# Patient Record
Sex: Female | Born: 1945 | Race: White | Hispanic: No | Marital: Married | Smoking: Former smoker
Health system: Southern US, Community
[De-identification: ages and names within clinical notes are randomized; demographics above are authoritative.]

## PROBLEM LIST (undated history)

## (undated) DIAGNOSIS — E119 Type 2 diabetes mellitus without complications: Secondary | ICD-10-CM

## (undated) DIAGNOSIS — R0600 Dyspnea, unspecified: Secondary | ICD-10-CM

## (undated) DIAGNOSIS — E079 Disorder of thyroid, unspecified: Secondary | ICD-10-CM

## (undated) DIAGNOSIS — I219 Acute myocardial infarction, unspecified: Secondary | ICD-10-CM

## (undated) DIAGNOSIS — M1990 Unspecified osteoarthritis, unspecified site: Secondary | ICD-10-CM

## (undated) DIAGNOSIS — I10 Essential (primary) hypertension: Secondary | ICD-10-CM

## (undated) HISTORY — PX: CHOLECYSTECTOMY: SHX55

## (undated) HISTORY — PX: EYE SURGERY: SHX253

## (undated) HISTORY — PX: TUBAL LIGATION: SHX77

## (undated) HISTORY — PX: OTHER SURGICAL HISTORY: SHX169

## (undated) HISTORY — PX: CARDIAC CATHETERIZATION: SHX172

---

## 1997-10-08 ENCOUNTER — Ambulatory Visit (HOSPITAL_COMMUNITY): Admission: RE | Admit: 1997-10-08 | Discharge: 1997-10-08 | Payer: Self-pay | Admitting: Obstetrics and Gynecology

## 1997-10-12 ENCOUNTER — Ambulatory Visit (HOSPITAL_COMMUNITY): Admission: RE | Admit: 1997-10-12 | Discharge: 1997-10-12 | Payer: Self-pay | Admitting: Obstetrics and Gynecology

## 1997-12-04 ENCOUNTER — Other Ambulatory Visit: Admission: RE | Admit: 1997-12-04 | Discharge: 1997-12-04 | Payer: Self-pay | Admitting: Obstetrics and Gynecology

## 1998-04-18 ENCOUNTER — Ambulatory Visit (HOSPITAL_COMMUNITY): Admission: RE | Admit: 1998-04-18 | Discharge: 1998-04-18 | Payer: Self-pay | Admitting: Obstetrics and Gynecology

## 1998-04-18 ENCOUNTER — Encounter: Payer: Self-pay | Admitting: Obstetrics and Gynecology

## 1998-09-11 ENCOUNTER — Ambulatory Visit: Admission: RE | Admit: 1998-09-11 | Discharge: 1998-09-11 | Payer: Self-pay | Admitting: Cardiology

## 1999-01-03 ENCOUNTER — Other Ambulatory Visit: Admission: RE | Admit: 1999-01-03 | Discharge: 1999-01-03 | Payer: Self-pay | Admitting: Obstetrics and Gynecology

## 1999-05-01 ENCOUNTER — Emergency Department (HOSPITAL_COMMUNITY): Admission: EM | Admit: 1999-05-01 | Discharge: 1999-05-01 | Payer: Self-pay | Admitting: Emergency Medicine

## 1999-05-02 ENCOUNTER — Encounter: Payer: Self-pay | Admitting: Emergency Medicine

## 1999-05-06 ENCOUNTER — Emergency Department (HOSPITAL_COMMUNITY): Admission: EM | Admit: 1999-05-06 | Discharge: 1999-05-06 | Payer: Self-pay | Admitting: *Deleted

## 1999-05-07 ENCOUNTER — Encounter: Payer: Self-pay | Admitting: Emergency Medicine

## 1999-05-13 ENCOUNTER — Emergency Department (HOSPITAL_COMMUNITY): Admission: EM | Admit: 1999-05-13 | Discharge: 1999-05-13 | Payer: Self-pay | Admitting: Emergency Medicine

## 1999-05-21 ENCOUNTER — Encounter: Admission: RE | Admit: 1999-05-21 | Discharge: 1999-05-21 | Payer: Self-pay | Admitting: Internal Medicine

## 1999-06-03 ENCOUNTER — Encounter: Payer: Self-pay | Admitting: Cardiology

## 1999-06-03 ENCOUNTER — Encounter: Admission: RE | Admit: 1999-06-03 | Discharge: 1999-06-03 | Payer: Self-pay | Admitting: Cardiology

## 1999-06-27 ENCOUNTER — Ambulatory Visit (HOSPITAL_COMMUNITY): Admission: RE | Admit: 1999-06-27 | Discharge: 1999-06-28 | Payer: Self-pay | Admitting: Surgery

## 1999-06-27 ENCOUNTER — Encounter (INDEPENDENT_AMBULATORY_CARE_PROVIDER_SITE_OTHER): Payer: Self-pay | Admitting: Specialist

## 1999-12-17 ENCOUNTER — Ambulatory Visit (HOSPITAL_COMMUNITY): Admission: RE | Admit: 1999-12-17 | Discharge: 1999-12-17 | Payer: Self-pay | Admitting: Obstetrics and Gynecology

## 1999-12-17 ENCOUNTER — Encounter: Payer: Self-pay | Admitting: Obstetrics and Gynecology

## 2000-01-06 ENCOUNTER — Other Ambulatory Visit: Admission: RE | Admit: 2000-01-06 | Discharge: 2000-01-06 | Payer: Self-pay | Admitting: Obstetrics and Gynecology

## 2000-12-24 ENCOUNTER — Ambulatory Visit (HOSPITAL_COMMUNITY): Admission: RE | Admit: 2000-12-24 | Discharge: 2000-12-24 | Payer: Self-pay | Admitting: Obstetrics and Gynecology

## 2000-12-24 ENCOUNTER — Encounter: Payer: Self-pay | Admitting: Obstetrics and Gynecology

## 2001-01-07 ENCOUNTER — Encounter: Payer: Self-pay | Admitting: Emergency Medicine

## 2001-01-07 ENCOUNTER — Emergency Department (HOSPITAL_COMMUNITY): Admission: EM | Admit: 2001-01-07 | Discharge: 2001-01-08 | Payer: Self-pay | Admitting: Emergency Medicine

## 2001-01-20 ENCOUNTER — Other Ambulatory Visit: Admission: RE | Admit: 2001-01-20 | Discharge: 2001-01-20 | Payer: Self-pay | Admitting: Obstetrics and Gynecology

## 2002-04-07 ENCOUNTER — Ambulatory Visit (HOSPITAL_COMMUNITY): Admission: RE | Admit: 2002-04-07 | Discharge: 2002-04-07 | Payer: Self-pay | Admitting: Obstetrics and Gynecology

## 2002-04-07 ENCOUNTER — Encounter: Payer: Self-pay | Admitting: Obstetrics and Gynecology

## 2002-10-01 ENCOUNTER — Emergency Department (HOSPITAL_COMMUNITY): Admission: EM | Admit: 2002-10-01 | Discharge: 2002-10-01 | Payer: Self-pay | Admitting: Emergency Medicine

## 2003-08-26 ENCOUNTER — Emergency Department (HOSPITAL_COMMUNITY): Admission: EM | Admit: 2003-08-26 | Discharge: 2003-08-26 | Payer: Self-pay | Admitting: Emergency Medicine

## 2004-04-09 ENCOUNTER — Ambulatory Visit (HOSPITAL_COMMUNITY): Admission: RE | Admit: 2004-04-09 | Discharge: 2004-04-09 | Payer: Self-pay | Admitting: Obstetrics and Gynecology

## 2004-12-02 ENCOUNTER — Emergency Department (HOSPITAL_COMMUNITY): Admission: EM | Admit: 2004-12-02 | Discharge: 2004-12-02 | Payer: Self-pay | Admitting: Emergency Medicine

## 2007-06-07 ENCOUNTER — Inpatient Hospital Stay (HOSPITAL_COMMUNITY): Admission: EM | Admit: 2007-06-07 | Discharge: 2007-06-11 | Payer: Self-pay | Admitting: Emergency Medicine

## 2007-06-09 ENCOUNTER — Ambulatory Visit: Payer: Self-pay | Admitting: Vascular Surgery

## 2007-06-09 ENCOUNTER — Encounter (INDEPENDENT_AMBULATORY_CARE_PROVIDER_SITE_OTHER): Payer: Self-pay | Admitting: Cardiology

## 2007-07-19 ENCOUNTER — Encounter (INDEPENDENT_AMBULATORY_CARE_PROVIDER_SITE_OTHER): Payer: Self-pay | Admitting: Cardiology

## 2007-07-19 ENCOUNTER — Ambulatory Visit: Payer: Self-pay | Admitting: Vascular Surgery

## 2007-07-19 ENCOUNTER — Ambulatory Visit (HOSPITAL_COMMUNITY): Admission: RE | Admit: 2007-07-19 | Discharge: 2007-07-19 | Payer: Self-pay | Admitting: Cardiology

## 2008-02-29 ENCOUNTER — Ambulatory Visit (HOSPITAL_COMMUNITY): Admission: RE | Admit: 2008-02-29 | Discharge: 2008-02-29 | Payer: Self-pay | Admitting: Cardiology

## 2010-07-22 NOTE — Discharge Summary (Signed)
Marilyn Sexton, Marilyn Sexton            ACCOUNT NO.:  [id number]   MEDICAL RECORD NO.:  [id number]          PATIENT TYPE:  INP   LOCATION:  2035                         FACILITY:  [facility]   PHYSICIAN:  Marilyn Sexton. Marilyn Sexton, M.D. DATE OF BIRTH:  [birth year]   DATE OF ADMISSION:  06/07/2007  DATE OF DISCHARGE:  06/11/2007                               DISCHARGE SUMMARY   ADMITTING DIAGNOSES:  1. Acute non-Q-wave myocardial infarction.  2. Hypertension.  3. Non-insulin-dependent diabetes mellitus.  4. Morbid obesity.  5. Hypothyroidism.  6. Hypercholesteremia.  7. Depression.   FINAL DIAGNOSES:  1. Status post acute non-Q-wave myocardial infarction.  2. Status post percutaneous transluminal coronary angioplasty stenting      to right coronary artery.  3. Very small right femoral arteriovenous fistula status post left      catheterization and percutaneous transluminal coronary angioplasty.  4. Hypertension.  5. Non-insulin-dependent diabetes mellitus.  6. Hypothyroidism.  7. Hypercholesteremia.  8. Morbid obesity.   DISCHARGE HOME MEDICATIONS:  1. Enteric-coated aspirin 81 mg 1 tablet daily.  2. Plavix 75 mg 1 tablet daily with food.  3. Lopressor 25 mg 1 tablet twice daily.  4. Lipitor 40 mg 1 tablet daily.  5. Benazepril 10 mg 1 tablet daily.  6. Metformin 500 mg 1 tablet twice daily with meals.  7. Lexapro 20 mg 1 tablet daily.  8. Risperdal 3 mg 1 tablet daily at night as before.  9. Synthroid 100 mcg 1 tablet daily as before.  10.Nitrostat 0.4 mg sublingual 1, use as directed.   The patient has been advised to monitor BP and blood sugar daily.  Post  PCI instructions have been given.   DIET:  Low-salt, low-cholesterol 1800 calories ADA diet.   ACTIVITY:  Increase activity slowly.  The patient will be scheduled for  phase II cardiac rehab as outpatient.  The patient has been advised to  call 911 if she notices any swelling in the right groin or any  discharge.  We will repeat  the duplex ultrasound of right femoral artery  if she had persistent bruit in few weeks, and if positive for AV  fistula, then refer her to vascular surgery for repair of the AV  fistula.   CONDITION AT DISCHARGE:  Stable.   BRIEF HISTORY:  Marilyn Sexton is a 65 year old white female with past  medical history significant for hypertension, non-insulin-dependent  diabetes mellitus, hypercholesteremia, morbid obesity, depression,  positive family history of coronary artery disease, and history of  tobacco abuse.  She came to the ER complaining of recurrent retrosternal  chest pain since last Friday, grade 5/10, radiating to the throat,  lasting few minutes on Friday and Sunday.  She took one sublingual  nitroglycerin on Sunday night from her husband meds with relief of chest  pain today.  She had chest pain lasting more than one hour, so decided  to come to the ER.  Denies any nausea, vomiting, or diaphoresis.  Denies  shortness of breath.  Denies PND, orthopnea, or leg swelling.  Denies  such episodes of chest pain in the past.  Denies any cardiac workup  in  the past.   PAST MEDICAL HISTORY:  As above.   PAST SURGICAL HISTORY:  She had cholecystectomy in the past and had  tubal ligation in the past.   SOCIAL HISTORY:  She is married, has one child.  Smoked one and a half  pack per day for 30 plus years, quit 17 years ago.  No history of  alcohol abuse.  She worked for [employer] in the past.   FAMILY HISTORY:  Father died of stroke at the age of 102.  Mother died of  ruptured aneurysm of brain.  One sister had coronary artery disease and  subsequently had MI.   ALLERGIES:  No known drug allergies.   MEDICATION AT HOME:  1. She was on Avandia 8 mg p.o. daily.  2. Risperdal 3 mg daily.  3. Synthroid 100 mcg daily.  4. Benazepril 20 mg daily.  5. Lexapro 20 mg daily.   PHYSICAL EXAMINATION:  GENERAL:  She was alert, awake, and oriented x3  in no acute distress.  VITAL  SIGNS:  Blood pressure was 163/73 and pulse of 93.  HEENT:  Conjunctivae was pink.  NECK:  Supple.  No JVD.  No bruit.  LUNGS:  Clear to auscultation without rhonchi or rales.  CARDIOVASCULAR:  S1 and S2 was normal.  There was soft systolic murmur  and S4 gallop.  ABDOMEN:  Soft.  Bowel sounds were present, obese, and nontender.  EXTREMITIES:  There is no clubbing, cyanosis, or edema.   LABS:  Chest x-ray showed no acute cardiopulmonary process.  Her  hemoglobin was 12.2 and hematocrit 36.  Point-of-care troponin I was  0.44 which was elevated and CPK-MB was slightly elevated at 8.5.  Repeat  hemoglobin was 11.9, hematocrit 34.7, white count of 7.0, and platelet  count was 233,000.  CPKs by lab 227, MB 12.7, relative index 5.6, and  troponin I was 2.92.  Repeat cardiac panel on June 09, 2007; CK 140, MB  2.7, relative index 1.9, and troponin I was 1.07.  On June 10, 2007; CK  of 186, MB 2.0, related index 1.1, and troponin I of 0.75.  Sodium was  137, potassium 4.4, chloride 99, blood glucose 122, BUN 10, and  creatinine 0.91.  Her TSH was 1.274 and hemoglobin A1c was 6.8.  Her  cholesterol was 231, LDL 154, HDL 38, and triglycerides 132.   BRIEF HOSPITAL COURSE:  Discussed with the patient and her husband  regarding emergency left cath and possible PTCA stenting, its risks and  benefits, i.e., death, mild stroke, need for emergency CABG, risk of  restenosis, and local vascular complications.  Accepted and consented  for PCI.  The patient subsequently underwent left cardiac cath with  selective left and right coronary angiography and PTCA stenting to  proximal and proximal and mid-junction of RCA as per procedure report.  The patient tolerated procedure well.  There were no complications.  The  patient did not have any episodes of chest pain during the hospital  stay.  The patient's phase I cardiac rehab was called.  The patient had  moderate to large sized ecchymosis and very faint  bruit with no evidence  of hematoma.  The patient had duplex ultrasound of the right femoral  artery which showed very small AV fistula but no evidence of  pseudoaneurysm.  The patient's, otherwise, groin remained stable.  The  patient has been ambulating in hallway without any problems.  Blood  sugar is fairly well controlled.  The patient did not have any episodes  of chest pain during the hospital stay or any cardiac arrhythmias.  The  patient will be discharged home on above medications and will be  followed up in my office in 1 week.  We will repeat the duplex  ultrasound in few weeks if she had persistent femoral bruits and refer  to Vascular Surgery for repair of the AV fistula.      Marilyn Sexton. Marilyn Sexton, M.D.  Electronically Signed     MNH/MEDQ  D:  06/11/2007  T:  06/11/2007  Job:  [id number]

## 2010-07-22 NOTE — Cardiovascular Report (Signed)
NAMEVERNESHA, Marilyn Sexton            ACCOUNT NO.:  [id number]   MEDICAL RECORD NO.:  [id number]          Marilyn Sexton TYPE:  INP   LOCATION:  2907                         FACILITY:  [facility]   PHYSICIAN:  Eduardo Osier. Sharyn Lull, M.D. DATE OF BIRTH:  19-Dec-1945   DATE OF PROCEDURE:  06/07/2007  DATE OF DISCHARGE:                            CARDIAC CATHETERIZATION   PROCEDURES:  1. Left cardiac cath with selective left and right coronary      angiography, left ventriculogram via right groin using Judkins      technique.  2. Successful percutaneous transluminal coronary angioplasty to      proximal and mid junction of right coronary artery using 2.5 x 12      mm long Voyager balloon.  3. Successful deployment of 2.75 x 28 mm long Promus drug-eluting      stent in proximal and mid right coronary artery.  4. Successful post dilatation of this stent using 3 x 12 mm long [place]      Voyager balloon.   INDICATIONS FOR PROCEDURE:  Marilyn Sexton is a 65 year old white female  with past medical history significant for hypertension, non-insulin-  dependent diabetes mellitus, hypercholesteremia, morbid obesity,  depression, positive family history of coronary artery disease, history  of tobacco abuse.  Marilyn Sexton came to [facility] ER complaining of retrosternal chest  pain since last Friday, grade 5/10, radiating to Marilyn throat, lasting few  minutes on Friday and then again on Sunday night, had similar pain.  Marilyn Sexton  took one sublingual nitroglycerin with relief of chest pain.  Marilyn  Marilyn Sexton did not seek medical attention until today.  This a.m. Marilyn Sexton  developed similar chest pain lasting more than one hour so Marilyn Sexton decided  to come to [facility] ER.  Denies any nausea, vomiting, diaphoresis.  Denies  shortness of breath.  Denies PND, orthopnea, leg swelling.  Denies such  episodes of chest pain in Marilyn past.  Denies any cardiac workup in Marilyn  past.   PAST MEDICAL HISTORY:  As above.   PAST SURGICAL HISTORY:  Marilyn Sexton had cholecystectomy and  tubal ligation in  Marilyn past.   SOCIAL HISTORY:  Marilyn Sexton is married, has one child.  Smoked one and a half  packs per day for 30+ years, quit 17 years ago.  No history of alcohol  abuse.  Worked for [employer] in Marilyn past.   FAMILY HISTORY:  Father died of stroke at Marilyn age of 41.  Mother had  ruptured brain aneurysm.  One sister had coronary artery disease and MI.   ALLERGIES:  No known drug allergies.   HOME MEDICATIONS:  Marilyn Sexton is on  1. Avandia 8 mg p.o. daily.  2. Risperdal 3 mg p.o. daily.  3. Synthroid 100 mcg daily.  4. Benazepril 20 mg daily.  5. Lexapro 20 mg daily.   PHYSICAL EXAMINATION:  GENERAL APPEARANCE:  Marilyn Sexton is alert, awake and  oriented x3, in no acute distress.  VITAL SIGNS:  Blood pressure was 163/73, pulse was 93.  HEENT:  Conjunctivae was pink.  NECK:  Supple.  No JVD, no bruit.  LUNGS:  Clear to auscultation  without rhonchi or rales.  CARDIOVASCULAR:  S1 and S2 normal.  There was soft systolic murmur and  S4 gallop.  ABDOMEN:  Soft.  Bowel sounds were present, obese, nontender.  EXTREMITIES:  There is no clubbing, cyanosis or edema.   EKG showed normal sinus rhythm with RSR pattern.  There was minimal ST  elevation in lead III but there are no reciprocal changes.  There were  nonspecific T-wave changes.   Her troponin I was 0.44 and 0.39.  CPK-MB was slightly elevated at 8.5.   Discussed with Marilyn Sexton regarding emergency left cath, possible PTCA  stenting, its risks and benefits, i.e., death, stroke, need for  emergency CABG, risk of restenosis, local vascular complications, etc.  and consented for Marilyn PCI.   PROCEDURE:  After obtaining Marilyn informed consent, Marilyn Sexton was  brought to Marilyn cath lab and was placed on fluoroscopy table.  Right  groin was prepped and draped in usual fashion.  I used 2% Xylocaine for  local anesthesia in Marilyn right groin.  With Marilyn help of thin-wall needle,  6-French arterial sheath was placed.  Marilyn sheath was  aspirated and  flushed.  Next a 6-French left Judkins catheter was advanced over Marilyn  wire under fluoroscopic guidance up to Marilyn ascending aorta.  Wire was  pulled out.  Marilyn catheter was aspirated and connected to Marilyn manifold.  Catheter was further advanced and engaged into left coronary ostium.  Multiple views of Marilyn left system were taken.  Next Marilyn catheter was  disengaged and was pulled out over Marilyn wire and was replaced with 6-  [place] right Judkins catheter which was advanced over Marilyn wire under  fluoroscopic guidance up to Marilyn ascending aorta.  Wire was pulled out,  Marilyn catheter was aspirated and connected to Marilyn manifold.  Catheter was  further advanced and engaged into right coronary ostium.  Multiple views  of Marilyn right system were taken.  Next Marilyn catheter was disengaged and  was pulled out over Marilyn wire and was replaced with pigtail catheter at  Marilyn end of Marilyn procedure, which was advanced over Marilyn wire under  fluoroscopic guidance up to Marilyn ascending aorta.  Catheter was further  advanced across Marilyn aortic valve into Marilyn LV.  LV pressures were  recorded.  Next LV graft was done in 30 degree RAO position.  Post  angiographic pressures were recorded from LV and then pullback pressures  were recorded from Marilyn aorta.  There was no gradient across Marilyn aortic  valve.  Next a pigtail catheter was pulled out over Marilyn wire.  Sheaths  aspirated and flushed.   FINDINGS:  LV showed inferior wall hypokinesia, EF of 50% approximately,  left main was patent.  LAD has 20-25% proximal stenosis.  Diagonal-1 has  20-30% proximal stenosis.  Left circumflex is patent proximally and then  tapers down in AV groove for after giving off large OM-3.  OM-1 is very,  very small which is diffusely disease.  OM-2 is very small which is  patent.  OM-3 is large, which is patent.  RCA has 40-50% ostial stenosis  and 90 and 99% sequential proximal and proximal and mid junction  stenosis with haziness.  PDA  and PLV branches were small which are  patent.   INTERVENTIONAL PROCEDURE:  Successful PTCA to proximal and proximal and  mid junction of RCA was done using 2.5 x 12 mm long Voyager balloon for  predilatation and then 2.75 x 28 mm long  Promus drug-eluting stent was  deployed in proximal and mid RCA at 13 atmospheres pressure.  Stent was  postdilated using 3 x 12 mm long [place] Voyager balloon going up to 15-18  atmospheres pressure.  Lesion was dilated from 95 and 99% to 0% residual  with excellent TIMI grade 3 distal flow without evidence of dissection  or distal embolization.  Marilyn Sexton  received weight-based heparin, Integrilin, 600 mg of Plavix and 5 mg of  IV Lopressor during Marilyn procedure.  Marilyn Sexton tolerated Marilyn procedure  well.  There were no complications.  Marilyn Sexton was transferred to Marilyn  recovery room in stable condition.      Eduardo Osier. Sharyn Lull, M.D.  Electronically Signed     MNH/MEDQ  D:  06/07/2007  T:  06/08/2007  Job:  [id number]   cc:   Cath Lab

## 2010-07-25 NOTE — Op Note (Signed)
. [facility]  Patient:    Marilyn Sexton, Marilyn Sexton                   MRN: [id number] Proc. Date: 06/27/99 Adm. Date:  [id number] Attending:  Abigail Miyamoto A                           Operative Report  PREOPERATIVE DIAGNOSIS:  Symptomatic cholelithiasis.  POSTOPERATIVE DIAGNOSIS:  Symptomatic cholelithiasis.  PROCEDURE:  Laparoscopic cholecystectomy.  SURGEON:  Abigail Miyamoto, M.D.  ANESTHESIA:  General endotracheal.  ESTIMATED BLOOD LOSS:  Minimal.  DESCRIPTION OF PROCEDURE:  The patient was brought to the operating room and identified as [name].  She was placed supine on the operating room table and general anesthesia was induced.  Her abdomen was then prepped and draped in the usual sterile fashion.  Using a ______ blade, a small transverse incision was made just below the umbilicus.  The incision was carried down bluntly through the abdominal wall fascia.  The fascia was then opened with a scalpel.  A hemostat as then used to pass into the peritoneal cavity.  Next, a 0 Vicryl pursestring suture was placed around the fascial opening.  The Hasson port was then placed through the opening and insufflation of the abdomen was begun.  Next, an 11 mm port was placed in the patients epigastrium and two 5 mm ports were placed in the patients right flank under direct vision.  The gallbladder was then grasped and retracted above the liver bed.  Dissection was then carried out.  The cystic duct and artery were each dissected out.  Both were then clipped three times proximally and once distally and transected with the scissors.  The gallbladder was then slowly dissected free from the liver bed with the electrocautery.  Hemostasis appeared to be achieved from the electrocautery.  The gallbladder was then completely removed and placed in an Endosac and removed through the umbilicus.  The 0 Vicryl at the umbilicus was then fastened in  place.  The abdomen was then thoroughly irrigated. Hemostasis again appeared to be achieved.  All ports were then removed under direct vision and the abdomen was deflated.  All skin incisions were then anesthetized  with 0.25% Marcaine.  The skin incisions were then closed with 4-0 Vicryl sutures. Steri-Strips, gauze and Tegaderm were then applied.  The patient tolerated the procedure well.  All sponge, needle and instrument counts were correct at the end of the procedure.  The patient was then extubated in the operating room and taken in stable condition to the recovery room. DD:  06/27/99 TD:  06/27/99 Job: [id number]

## 2010-12-01 LAB — CBC
Hemoglobin: 11.4 — ABNORMAL LOW
MCV: 95.3
MCV: 96.3
Platelets: 207
RDW: 16.9 — ABNORMAL HIGH
WBC: 7
WBC: 7

## 2010-12-01 LAB — POCT I-STAT, CHEM 8
BUN: 10
Creatinine, Ser: 0.9
Glucose, Bld: 83
TCO2: 29

## 2010-12-01 LAB — COMPREHENSIVE METABOLIC PANEL
Albumin: 3.5
BUN: 7
CO2: 27
Chloride: 102
Creatinine, Ser: 0.65
GFR calc non Af Amer: >60
Potassium: 4
Total Bilirubin: 0.8

## 2010-12-01 LAB — CARDIAC PANEL(CRET KIN+CKTOT+MB+TROPI): CK, MB: 12.7 — ABNORMAL HIGH

## 2010-12-01 LAB — TSH: TSH: 1.274

## 2010-12-01 LAB — HEMOGLOBIN A1C: Hgb A1c MFr Bld: 6.8 — ABNORMAL HIGH

## 2010-12-01 LAB — POCT CARDIAC MARKERS
CKMB, poc: 8.5
Myoglobin, poc: 77.4
Troponin i, poc: 0.39 — ABNORMAL HIGH

## 2010-12-01 LAB — PROTIME-INR: Prothrombin Time: 13.8

## 2010-12-02 LAB — CARDIAC PANEL(CRET KIN+CKTOT+MB+TROPI)
CK, MB: 2
CK, MB: 8 — ABNORMAL HIGH
Relative Index: 1.1
Relative Index: 1.9
Relative Index: 3.8 — ABNORMAL HIGH
Total CK: 212 — ABNORMAL HIGH
Total CK: 230 — ABNORMAL HIGH
Troponin I: 3.08

## 2010-12-02 LAB — CBC
HCT: 26.7 — ABNORMAL LOW
HCT: 30.5 — ABNORMAL LOW
Hemoglobin: 10.2 — ABNORMAL LOW
Hemoglobin: 10.4 — ABNORMAL LOW
MCV: 100.7 — ABNORMAL HIGH
MCV: 95.3
RBC: 3.1 — ABNORMAL LOW
RBC: 3.18 — ABNORMAL LOW
RDW: 14.6
WBC: 6.7
WBC: 6.8
WBC: 7

## 2010-12-02 LAB — BASIC METABOLIC PANEL
BUN: 7
CO2: 26
Calcium: 8.5
Creatinine, Ser: 0.77
GFR calc non Af Amer: >60
Sodium: 133 — ABNORMAL LOW

## 2010-12-02 LAB — COMPREHENSIVE METABOLIC PANEL
ALT: 34
AST: 36
Alkaline Phosphatase: 66
CO2: 29
GFR calc non Af Amer: >60
Glucose, Bld: 122 — ABNORMAL HIGH
Potassium: 4.4
Sodium: 137
Total Protein: 5.7 — ABNORMAL LOW

## 2010-12-02 LAB — LIPID PANEL
Cholesterol: 231 — ABNORMAL HIGH
HDL: 38 — ABNORMAL LOW
Triglycerides: 194 — ABNORMAL HIGH

## 2010-12-02 LAB — BASIC METABOLIC PANEL WITH GFR
BUN: 10
CO2: 29
Calcium: 8.9
Chloride: 100
Creatinine, Ser: 0.81
GFR calc non Af Amer: >60
Glucose, Bld: 157 — ABNORMAL HIGH
Potassium: 3.9
Sodium: 134 — ABNORMAL LOW

## 2010-12-02 LAB — DIFFERENTIAL
Basophils Relative: 0
Eosinophils Absolute: 0.1
Eosinophils Relative: 1
Monocytes Relative: 11
Neutrophils Relative %: 56

## 2011-09-09 ENCOUNTER — Other Ambulatory Visit (HOSPITAL_COMMUNITY): Payer: Self-pay | Admitting: Cardiology

## 2011-09-09 DIAGNOSIS — R079 Chest pain, unspecified: Secondary | ICD-10-CM

## 2011-09-21 ENCOUNTER — Encounter (HOSPITAL_COMMUNITY)
Admission: RE | Admit: 2011-09-21 | Discharge: 2011-09-21 | Disposition: A | Discharge status: Home / Self Care | Payer: Self-pay | Source: Ambulatory Visit | Attending: Cardiology | Admitting: Cardiology

## 2011-09-21 ENCOUNTER — Ambulatory Visit (HOSPITAL_COMMUNITY)
Admission: RE | Admit: 2011-09-21 | Discharge: 2011-09-21 | Disposition: A | Discharge status: Home / Self Care | Payer: Self-pay | Source: Ambulatory Visit | Attending: Cardiology | Admitting: Cardiology

## 2011-09-21 DIAGNOSIS — R0602 Shortness of breath: Secondary | ICD-10-CM | POA: Insufficient documentation

## 2011-09-21 DIAGNOSIS — R079 Chest pain, unspecified: Secondary | ICD-10-CM

## 2011-09-21 MED ORDER — TECHNETIUM TC 99M TETROFOSMIN IV KIT
10.0000 | PACK | Freq: Once | INTRAVENOUS | Status: AC | PRN
  Administered 2011-09-21: 10 via INTRAVENOUS

## 2011-09-21 MED ORDER — REGADENOSON 0.4 MG/5ML IV SOLN
INTRAVENOUS | Status: AC
  Administered 2011-09-21: 0.4 mg via INTRAVENOUS
  Filled 2011-09-21: qty 5

## 2011-09-21 MED ORDER — REGADENOSON 0.4 MG/5ML IV SOLN
0.4000 mg | Freq: Once | INTRAVENOUS | Status: AC
  Administered 2011-09-21: 0.4 mg via INTRAVENOUS

## 2011-09-21 MED ORDER — TECHNETIUM TC 99M TETROFOSMIN IV KIT
30.0000 | PACK | Freq: Once | INTRAVENOUS | Status: AC | PRN
  Administered 2011-09-21: 30 via INTRAVENOUS

## 2013-11-03 ENCOUNTER — Encounter (HOSPITAL_COMMUNITY): Payer: Self-pay | Admitting: *Deleted

## 2013-11-03 ENCOUNTER — Ambulatory Visit (HOSPITAL_COMMUNITY)
Admission: RE | Admit: 2013-11-03 | Discharge: 2013-11-03 | Disposition: A | Discharge status: Home / Self Care | Payer: Self-pay | Attending: Psychiatry | Admitting: Psychiatry

## 2013-11-03 NOTE — BH Assessment (Signed)
Assessment Note  Marilyn Sexton is an 68 y.o. female. Pt presents to [facility] accompanied by [employer]. Patient reports that she is presenting because her husband and her son think she is crazy. Patient reports that she was willing to participate in an assessment only if law enforcement brought her to [facility]. 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 Patient reports that she has not heard any voices since. Pt proclaims that she has been healed from the voices by God. Pt presents hyper religious. Patient reports that she is a [place] and her husband and son are not and don't believe in the same religious beliefs that she does. Patient reports that her husband might think she is crazy because she sits on the porch at night waiting for Jesus to return. Patient proclaims that Jesus is coming soon. Patient insist that she is not crazy and nothing is wrong with her and she does not need to be on any medication or see a psychiatrist. Pt denies SI,HI, and no AVH reported. Patient's husband provides collateral and reports that he is concerned about patient's hyper religious behavior but does not feel that patient is a threat to anyone or herself.  Consulted with AC Thurman Coyer and Maryjean Morn who is recommending that patient follow-up with current provider Tamela Oddi at [facility] and PCP for routine follow-up exam. Pt agreeable to follow up with psychiatrist as needed and reports that she had a routine appointment with PCP 2 months ago. Crisis resources provided.  MSE Declined.  Axis I: Mood Disorder NOS Axis II: Deferred Axis III: No past medical history on file. Axis IV: problems related to social environment and problems with primary support group Axis V: 61-70 mild symptoms  Past Medical History:  No past medical history on file.  No past surgical history on file.  Family History: No family history on file.  Social History:  reports that she does not drink alcohol or use illicit drugs. Her tobacco history is not on file.  Additional Social History:  Alcohol / Drug Use History of alcohol / drug use?: No history of alcohol / drug abuse  CIWA:   COWS:    Allergies: No Known Allergies  Home Medications:  (Not in a hospital admission)  OB/GYN Status:  No LMP recorded. Patient is postmenopausal.  General Assessment Data Location of Assessment: [facility] Is this a Tele or Face-to-Face Assessment?: Face-to-Face Is this an Initial Assessment or a Re-assessment for this encounter?: Initial Assessment Living Arrangements: Spouse/significant other Can pt return to current living arrangement?: Yes Admission Status: Voluntary Is patient capable of signing voluntary admission?: Yes Transfer from: Home Referral Source: Self/Family/Friend     [facility] Crisis Care Plan Living Arrangements: Spouse/significant other Name of Psychiatrist: None Current Provider Name of Therapist: None Current Provider     Risk to self with the past 6 months Suicidal Ideation: No Suicidal Intent: No Is patient at risk for suicide?: No Suicidal Plan?: No Access to Means: No What has been your use of drugs/alcohol within the last 12 months?: None reported Previous Attempts/Gestures: No How many times?: 0 Other Self Harm Risks: 0 Triggers for Past Attempts: None known Intentional Self Injurious Behavior: None Family Suicide History: No Recent stressful life event(s): Conflict (Comment) (conflict with husband) Persecutory  voices/beliefs?: No Depression: No Substance abuse history and/or treatment for substance abuse?: No Suicide prevention information given to non-admitted patients: Yes  Risk to Others within the past 6 months Homicidal Ideation: No Thoughts of Harm to Others:  No Current Homicidal Intent: No Current Homicidal Plan: No Access to Homicidal Means: No Identified Victim: na History of harm to others?: No Assessment of Violence: None Noted Violent Behavior Description: None Noted Does patient have access to weapons?: No Criminal Charges Pending?: No Does patient have a court date: No  Psychosis Hallucinations: None noted Delusions: None noted  Mental Status Report Appear/Hygiene: Other (Comment) (appropriate) Eye Contact: Fair Motor Activity: Freedom of movement Speech: Logical/coherent Level of Consciousness: Alert Mood: Anxious Affect: Anxious;Appropriate to circumstance Anxiety Level: Minimal Thought Processes: Coherent;Relevant Judgement: Unimpaired Orientation: Person;Place;Time;Situation Obsessive Compulsive Thoughts/Behaviors: None  Cognitive Functioning Concentration: Normal Memory: Recent Intact;Remote Intact IQ: Average Insight: Fair Impulse Control: Fair Appetite: Good Weight Loss: 0 Weight Gain: 0 Sleep: No Change Total Hours of Sleep: 7 Vegetative Symptoms: None  ADLScreening [facility] Assessment Services) Patient's cognitive ability adequate to safely complete daily activities?: Yes Patient able to express need for assistance with ADLs?: Yes Independently performs ADLs?: Yes (appropriate for developmental age)  Prior Inpatient Therapy Prior Inpatient Therapy: Yes Prior Therapy Dates: 1980's Prior Therapy Facilty/Provider(s): [facility] in the [phone] Reason for Treatment: Psychosis-AH, and  "chemical imbalance"  Prior Outpatient Therapy Prior Outpatient Therapy: Yes Prior Therapy Dates: Current Provider Prior Therapy Facilty/Provider(s): [facility] Tressie Ellis NP Reason for Treatment: Psychiatry  ADL Screening (condition at time of admission) Patient's cognitive ability adequate to safely complete daily activities?: Yes Is the patient deaf or have difficulty hearing?: No Does  the patient have difficulty seeing, even when wearing glasses/contacts?: No Does the patient have difficulty concentrating, remembering, or making decisions?: No Patient able to express need for assistance with ADLs?: Yes Does the patient have difficulty dressing or bathing?: No Independently performs ADLs?: Yes (appropriate for developmental age) Does the patient have difficulty walking or climbing stairs?: No Weakness of Legs: None  Home Assistive Devices/Equipment Home Assistive Devices/Equipment: None    Abuse/Neglect Assessment (Assessment to be complete while patient is alone) Physical Abuse: Denies Verbal Abuse: Denies Sexual Abuse: Denies Exploitation of patient/patient's resources: Denies Self-Neglect: Denies     Merchant navy officer (For Healthcare) Does patient have an advance directive?: No Would patient like information on creating an advanced directive?: No - patient declined information    Additional Information 1:1 In Past 12 Months?: No CIRT Risk: No Elopement Risk: No Does patient have medical clearance?: No     Disposition:  Disposition Initial Assessment Completed for this Encounter: Yes Disposition of Patient: Other dispositions Other disposition(s): To current provider  On Site Evaluation by:   Reviewed with Physician:    Gerline Legacy, MS, LCASA Assessment Counselor  11/03/2013 2:35 PM

## 2014-07-19 ENCOUNTER — Encounter (HOSPITAL_COMMUNITY): Payer: Self-pay | Admitting: *Deleted

## 2014-07-19 ENCOUNTER — Emergency Department (HOSPITAL_COMMUNITY)
Admission: EM | Admit: 2014-07-19 | Discharge: 2014-07-23 | Disposition: A | Discharge status: Home / Self Care | Payer: Medicare Other | Attending: Emergency Medicine | Admitting: Emergency Medicine

## 2014-07-19 DIAGNOSIS — F3010 Manic episode without psychotic symptoms, unspecified: Secondary | ICD-10-CM

## 2014-07-19 DIAGNOSIS — F333 Major depressive disorder, recurrent, severe with psychotic symptoms: Secondary | ICD-10-CM | POA: Insufficient documentation

## 2014-07-19 DIAGNOSIS — R000 Tachycardia, unspecified: Secondary | ICD-10-CM | POA: Insufficient documentation

## 2014-07-19 DIAGNOSIS — Z9119 Patient's noncompliance with other medical treatment and regimen: Secondary | ICD-10-CM | POA: Insufficient documentation

## 2014-07-19 DIAGNOSIS — F329 Major depressive disorder, single episode, unspecified: Secondary | ICD-10-CM | POA: Diagnosis present

## 2014-07-19 DIAGNOSIS — E119 Type 2 diabetes mellitus without complications: Secondary | ICD-10-CM | POA: Insufficient documentation

## 2014-07-19 DIAGNOSIS — F3112 Bipolar disorder, current episode manic without psychotic features, moderate: Secondary | ICD-10-CM | POA: Diagnosis present

## 2014-07-19 DIAGNOSIS — E079 Disorder of thyroid, unspecified: Secondary | ICD-10-CM | POA: Insufficient documentation

## 2014-07-19 DIAGNOSIS — Z7982 Long term (current) use of aspirin: Secondary | ICD-10-CM | POA: Insufficient documentation

## 2014-07-19 DIAGNOSIS — Z9114 Patient's other noncompliance with medication regimen: Secondary | ICD-10-CM | POA: Diagnosis not present

## 2014-07-19 DIAGNOSIS — I10 Essential (primary) hypertension: Secondary | ICD-10-CM | POA: Insufficient documentation

## 2014-07-19 DIAGNOSIS — I679 Cerebrovascular disease, unspecified: Secondary | ICD-10-CM | POA: Diagnosis not present

## 2014-07-19 DIAGNOSIS — Z79899 Other long term (current) drug therapy: Secondary | ICD-10-CM | POA: Diagnosis not present

## 2014-07-19 DIAGNOSIS — Z01818 Encounter for other preprocedural examination: Secondary | ICD-10-CM

## 2014-07-19 HISTORY — DX: Type 2 diabetes mellitus without complications: E11.9

## 2014-07-19 HISTORY — DX: Essential (primary) hypertension: I10

## 2014-07-19 HISTORY — DX: Disorder of thyroid, unspecified: E07.9

## 2014-07-19 NOTE — ED Provider Notes (Addendum)
CSN: [id number]     Arrival date & time 07/19/14  [id number] History   This chart was scribed for Romelle Muldoon, MD by Modena JanskyAlbert Thayil, ED Scribe. This patient was seen in room WTR2/WLPT2 and the patient's care was started at 11:53 PM.     Medical screening examination/treatment/procedure(s) were conducted as a shared visit with non-physician practitioner(s) and myself.  I personally evaluated the patient during the encounter.   EKG Interpretation None      Chief Complaint  Patient presents with  . Depression   Patient is a [age] y.o. female presenting with general illness. The history is provided by the patient. No language interpreter was used.  Illness Severity:  Moderate Onset quality:  Gradual Timing:  Constant Progression:  Worsening Chronicity:  New Context:  Not taking psychiatric and BP medication Relieved by:  None Worsened by:  None Ineffective treatments:  None  HPI Comments: Marilyn Sexton is a 69 y.o. female who presents to the Emergency Department complaining of depression. She states that she has a hx of manic schizophrenia and has not been taking her medication for about a year. She reports that "the Shaune PollackLord said that you don't have a chemical imbalance". She states that she has not been taking her BP medication. She denies any SI, HI, or hallucinations.   Past Medical History  Diagnosis Date  . Thyroid disease   . Hypertension   . Diabetes mellitus without complication    History reviewed. No pertinent past surgical history. No family history on file. History  Substance Use Topics  . Smoking status: Never Smoker   . Smokeless tobacco: Not on file  . Alcohol Use: No   OB History    No data available     Review of Systems  Psychiatric/Behavioral: Positive for behavioral problems. Negative for suicidal ideas and hallucinations.  All other systems reviewed and are negative.   Allergies  Review of patient's allergies indicates no known allergies.  Home  Medications   Prior to Admission medications   Medication Sig Start Date End Date Taking? Authorizing Provider  aspirin EC 81 MG tablet Take 81 mg by mouth every evening.   Yes Historical Provider, MD  levothyroxine (SYNTHROID, LEVOTHROID) 100 MCG tablet Take 100 mcg by mouth daily before breakfast.   Yes Historical Provider, MD  metFORMIN (GLUCOPHAGE) 1000 MG tablet Take 1,000 mg by mouth 2 (two) times daily.   Yes Historical Provider, MD  PRESCRIPTION MEDICATION Take 1 tablet by mouth daily. 2nd Medication for blood sugar unknown to patient once daily   Yes Historical Provider, MD   BP 234/99 mmHg  Pulse 115  Temp(Src) 97.5 F (36.4 C) (Oral)  Resp 22  SpO2 98% Physical Exam  Constitutional: She is oriented to person, place, and time. She appears well-developed and well-nourished. No distress.  HENT:  Head: Normocephalic and atraumatic.  Mouth/Throat: Oropharynx is clear and moist. No oropharyngeal exudate.  Eyes: Conjunctivae and EOM are normal.  Neck: Normal range of motion. Neck supple. No tracheal deviation present.  Cardiovascular: Normal rate, regular rhythm and normal heart sounds.  Exam reveals no gallop and no friction rub.   No murmur heard. Pulmonary/Chest: Effort normal and breath sounds normal. No respiratory distress. She has no wheezes. She has no rales.  Abdominal: Soft. Bowel sounds are normal. There is no tenderness. There is no rebound and no guarding.  Musculoskeletal: Normal range of motion.  Neurological: She is alert and oriented to person, place, and time.  Skin: Skin  is warm and dry.  Psychiatric: Her speech is rapid and/or pressured. She is not actively hallucinating. Thought content is delusional.  Nursing note and vitals reviewed.   ED Course  Procedures (including critical care time) DIAGNOSTIC STUDIES: Oxygen Saturation is 98% on RA, normal by my interpretation.    COORDINATION OF CARE: 11:57 PM- Pt advised of plan for treatment which includes  labs and pt agrees.  Labs Review Labs Reviewed  URINALYSIS, ROUTINE W REFLEX MICROSCOPIC  ETHANOL  URINE RAPID DRUG SCREEN (HOSP PERFORMED)    Imaging Review No results found.   EKG Interpretation None      MDM   Final diagnoses:  None   Hyper religious not taking meds because of same.     To be seen in am by psychiatry attending in am  Per Dr. Sharyn LullHarwani, if she is discharged to be seen immediately thereafter.    I personally performed the services described in this documentation, which was scribed in my presence. The recorded information has been reviewed and is accurate.      Cy BlamerApril Magnum Lunde, MD 07/20/14 0450  Millisa Giarrusso, MD 07/20/14 [phone]

## 2014-07-19 NOTE — ED Provider Notes (Signed)
CSN: [id number]     Arrival date & time 07/19/14  1917 History   First MD Initiated Contact with Patient 07/19/14 2304     Chief Complaint  Patient presents with  . Depression     (Consider location/radiation/quality/duration/timing/severity/associated sxs/prior Treatment) Patient is a 69 y.o. female presenting with mental health disorder. The history is provided by the patient and the spouse. No language interpreter was used.  Mental Health Problem Presenting symptoms: aggressive behavior, bizarre behavior and disorganized thought process   Patient accompanied by:  Spouse Onset quality:  Unable to specify Timing:  Constant Progression:  Worsening Chronicity:  New Treatment compliance:  None of the time Relieved by:  Nothing Worsened by:  Nothing tried Ineffective treatments:  None tried Risk factors: hx of mental illness   Pt difficult to obtain information. Pt angry at husband.  Pt talking to Jesus.  Pt reports she does not have high blood pressure. Pt is refusing evaluation and treatment.  Husband is concerned.  He reports he is concerned about pt's blood pressure and mental health.  (Pt went to farmers market 3 times and told husband they kept giving her bad corn.)  Pt reports we are just trying to make her angry.  Past Medical History  Diagnosis Date  . Thyroid disease   . Hypertension   . Diabetes mellitus without complication    History reviewed. No pertinent past surgical history. No family history on file. History  Substance Use Topics  . Smoking status: Never Smoker   . Smokeless tobacco: Not on file  . Alcohol Use: No   OB History    No data available     Review of Systems  Psychiatric/Behavioral: Positive for behavioral problems.  All other systems reviewed and are negative.     Allergies  Review of patient's allergies indicates no known allergies.  Home Medications   Prior to Admission medications   Medication Sig Start Date End Date Taking?  Authorizing Provider  aspirin EC 81 MG tablet Take 81 mg by mouth every evening.   Yes Historical Provider, MD  levothyroxine (SYNTHROID, LEVOTHROID) 100 MCG tablet Take 100 mcg by mouth daily before breakfast.   Yes Historical Provider, MD  metFORMIN (GLUCOPHAGE) 1000 MG tablet Take 1,000 mg by mouth 2 (two) times daily.   Yes Historical Provider, MD  PRESCRIPTION MEDICATION Take 1 tablet by mouth daily. 2nd Medication for blood sugar unknown to patient once daily   Yes Historical Provider, MD   BP 234/99 mmHg  Pulse 115  Temp(Src) 97.5 F (36.4 C) (Oral)  Resp 22  SpO2 98% Physical Exam  Constitutional: She is oriented to person, place, and time. She appears well-developed and well-nourished.  HENT:  Head: Normocephalic and atraumatic.  Right Ear: External ear normal.  Left Ear: External ear normal.  Nose: Nose normal.  Mouth/Throat: Oropharynx is clear and moist.  Eyes: Conjunctivae and EOM are normal. Pupils are equal, round, and reactive to light.  Neck: Normal range of motion.  Cardiovascular: Normal heart sounds.   tachycardic  Pulmonary/Chest: Effort normal.  Abdominal: Soft. She exhibits no distension.  Musculoskeletal: Normal range of motion.  Neurological: She is alert and oriented to person, place, and time.  Skin: Skin is warm.  Psychiatric:  Agitated, angry,  Talking to jesus.   Nursing note and vitals reviewed.   ED Course  Procedures (including critical care time) Labs Review Labs Reviewed - No data to display  Imaging Review No results found.   EKG Interpretation  None      MDM  Blood pressure elevated, Pt refuses any evaluation   Final diagnoses:  Manic behavior  Essential hypertension    Dr. Nicanor AlconPalumbo in to see and examine.  Involuntary commitment papers to magistrate.    Elson AreasLeslie K Sofia, PA-C 07/20/14 [id number]  April Palumbo, MD 07/20/14 [phone]

## 2014-07-19 NOTE — ED Notes (Signed)
Family brings the patient to the ER for evaluation due to concern about pt not taking her blood pressure medication and her depression medication in over a year; pt states that God healed her chemical imbalance and that she no longer needs the medication; pt states that her husband does not believe that God healed her and that he is a non-believer; pt states that she goes on the porch at night to watch for the coming of Christ; pt is angry that family feels that there is something wrong with her; pt states "talk to my husband, he's the one who thinks I need to be here"

## 2014-07-20 ENCOUNTER — Emergency Department (HOSPITAL_COMMUNITY): Payer: Medicare Other

## 2014-07-20 ENCOUNTER — Encounter (HOSPITAL_COMMUNITY): Payer: Self-pay | Admitting: Emergency Medicine

## 2014-07-20 DIAGNOSIS — Z9114 Patient's other noncompliance with medication regimen: Secondary | ICD-10-CM | POA: Insufficient documentation

## 2014-07-20 DIAGNOSIS — F333 Major depressive disorder, recurrent, severe with psychotic symptoms: Secondary | ICD-10-CM | POA: Diagnosis not present

## 2014-07-20 DIAGNOSIS — F3010 Manic episode without psychotic symptoms, unspecified: Secondary | ICD-10-CM | POA: Insufficient documentation

## 2014-07-20 LAB — CBC WITH DIFFERENTIAL/PLATELET
BASOS ABS: 0 10*3/uL (ref 0.0–0.1)
Basophils Relative: 0 % (ref 0–1)
EOS PCT: 1 % (ref 0–5)
Eosinophils Absolute: 0.2 10*3/uL (ref 0.0–0.7)
HEMATOCRIT: 39.7 % (ref 36.0–46.0)
Hemoglobin: 14 g/dL (ref 12.0–15.0)
Lymphocytes Relative: 25 % (ref 12–46)
Lymphs Abs: 3.1 10*3/uL (ref 0.7–4.0)
MCH: 33.5 pg (ref 26.0–34.0)
MCHC: 35.3 g/dL (ref 30.0–36.0)
MCV: 95 fL (ref 78.0–100.0)
MONO ABS: 0.9 10*3/uL (ref 0.1–1.0)
MONOS PCT: 8 % (ref 3–12)
NEUTROS ABS: 8.2 10*3/uL — AB (ref 1.7–7.7)
Neutrophils Relative %: 66 % (ref 43–77)
Platelets: 315 10*3/uL (ref 150–400)
RBC: 4.18 MIL/uL (ref 3.87–5.11)
RDW: 14.8 % (ref 11.5–15.5)
WBC: 12.4 10*3/uL — AB (ref 4.0–10.5)

## 2014-07-20 LAB — RAPID URINE DRUG SCREEN, HOSP PERFORMED
Amphetamines: NOT DETECTED
Amphetamines: NOT DETECTED
BARBITURATES: NOT DETECTED
BARBITURATES: NOT DETECTED
BENZODIAZEPINES: NOT DETECTED
Benzodiazepines: NOT DETECTED
COCAINE: NOT DETECTED
Cocaine: NOT DETECTED
Opiates: NOT DETECTED
Opiates: NOT DETECTED
Tetrahydrocannabinol: NOT DETECTED
Tetrahydrocannabinol: NOT DETECTED

## 2014-07-20 LAB — ETHANOL: Alcohol, Ethyl (B): <5 mg/dL (ref <5)

## 2014-07-20 LAB — CBG MONITORING, ED
Glucose-Capillary: 230 mg/dL — ABNORMAL HIGH (ref 65–99)
Glucose-Capillary: 230 mg/dL — ABNORMAL HIGH (ref 65–99)

## 2014-07-20 LAB — URINE MICROSCOPIC-ADD ON

## 2014-07-20 LAB — URINALYSIS, ROUTINE W REFLEX MICROSCOPIC
Bilirubin Urine: NEGATIVE
Bilirubin Urine: NEGATIVE
GLUCOSE, UA: 500 mg/dL — AB
KETONES UR: NEGATIVE mg/dL
KETONES UR: NEGATIVE mg/dL
Nitrite: NEGATIVE
Nitrite: NEGATIVE
PH: 7 (ref 5.0–8.0)
PROTEIN: NEGATIVE mg/dL
Protein, ur: NEGATIVE mg/dL
Specific Gravity, Urine: 1.006 (ref 1.005–1.030)
Specific Gravity, Urine: 1.005 (ref 1.005–1.030)
UROBILINOGEN UA: 0.2 mg/dL (ref 0.0–1.0)
Urobilinogen, UA: 0.2 mg/dL (ref 0.0–1.0)
pH: 6.5 (ref 5.0–8.0)

## 2014-07-20 LAB — BASIC METABOLIC PANEL
ANION GAP: 12 (ref 5–15)
BUN: 11 mg/dL (ref 6–20)
CHLORIDE: 93 mmol/L — AB (ref 101–111)
CO2: 27 mmol/L (ref 22–32)
Calcium: 9.5 mg/dL (ref 8.9–10.3)
Creatinine, Ser: 0.76 mg/dL (ref 0.44–1.00)
GFR calc Af Amer: >60 mL/min (ref >60)
GFR calc non Af Amer: >60 mL/min (ref >60)
GLUCOSE: 208 mg/dL — AB (ref 65–99)
Potassium: 3.6 mmol/L (ref 3.5–5.1)
Sodium: 132 mmol/L — ABNORMAL LOW (ref 135–145)

## 2014-07-20 LAB — TROPONIN I: Troponin I: <0.03 ng/mL (ref <0.031)

## 2014-07-20 MED ORDER — CLONIDINE HCL 0.2 MG/24HR TD PTWK
0.2000 mg | MEDICATED_PATCH | TRANSDERMAL | Status: DC
  Filled 2014-07-20: qty 1

## 2014-07-20 MED ORDER — NICOTINE 21 MG/24HR TD PT24
21.0000 mg | MEDICATED_PATCH | Freq: Every day | TRANSDERMAL | Status: DC
  Administered 2014-07-23: 21 mg via TRANSDERMAL
  Filled 2014-07-20: qty 1

## 2014-07-20 MED ORDER — ACETAMINOPHEN 325 MG PO TABS: 650.0000 mg | ORAL_TABLET | ORAL | Status: DC | PRN

## 2014-07-20 MED ORDER — ONDANSETRON HCL 4 MG PO TABS
4.0000 mg | ORAL_TABLET | Freq: Three times a day (TID) | ORAL | Status: DC | PRN
  Administered 2014-07-23: 4 mg via ORAL
  Filled 2014-07-20 (×2): qty 1

## 2014-07-20 MED ORDER — AMLODIPINE BESYLATE 5 MG PO TABS
5.0000 mg | ORAL_TABLET | Freq: Once | ORAL | Status: AC
  Administered 2014-07-21: 5 mg via ORAL
  Filled 2014-07-20 (×3): qty 1

## 2014-07-20 MED ORDER — DEXTROSE 5 % IV SOLN
0.5000 mg/min | INTRAVENOUS | Status: DC
  Filled 2014-07-20: qty 100

## 2014-07-20 MED ORDER — CLONIDINE HCL 0.1 MG PO TABS
0.2000 mg | ORAL_TABLET | Freq: Once | ORAL | Status: AC
  Administered 2014-07-20: 0.2 mg via ORAL
  Filled 2014-07-20: qty 2

## 2014-07-20 NOTE — ED Notes (Signed)
EKG was delayed due to possible repeat protocol orders.  RN will consult with EDP also because pt is refusing procedures.

## 2014-07-20 NOTE — BH Assessment (Signed)
Reviewed ED notes prior to initiating assessment. Pt reports increased depression denies SI, HI. Reports she stopped medication because God told her she does not have a chemical imbalance. [facility].   Requested cart be placed with pt for assessment.   Assessment to commence shortly.    Clista BernhardtNancy Nikiesha Milford, [facility] Triage Specialist 07/20/2014 2:44 AM

## 2014-07-20 NOTE — ED Notes (Addendum)
Patient states she does not want to take BP meds "They ain't bringing my pressure down." Cooperative. Requested amlodipine via message to pharmacy.

## 2014-07-20 NOTE — ED Notes (Addendum)
PT DECLINES X-RAY AT THIS TIME. RADIOLOGY MADE AWARE. HUSBAND PRESENT. Esmond PlantsJ. ONUAHA NP MADE AWARE. NO ADDITIONAL ORDERS GIVEN.

## 2014-07-20 NOTE — ED Notes (Signed)
Patient with moderate amount of emesis in her floor. Dr. Rhunette CroftNanavati notified, new orders received. Pt refused Zofran offered by this RN. Pt resting in bed at this time. No s/s of distress noted. Respirations regular and unlabored, skin warm and dry.

## 2014-07-20 NOTE — BH Assessment (Addendum)
[facility] Assessment Progress Note  The following facilities have been contacted to seek placement for this patient, with results as noted:  Beds available, information faxed, decision pending:  [facility]  At capacity:  [facility]  Left voice mail message, awaiting response:  [facility]  Delta Pichon, MA Triage Specialist [phone]     

## 2014-07-20 NOTE — ED Notes (Addendum)
Patient is sleeping, not in view of the nurses station. Side rail down with concern that patient will climb over when awakened if both are raised.  Sitter from room 11 reassigned to this patient.

## 2014-07-20 NOTE — ED Provider Notes (Signed)
  Physical Exam  BP 180/88 mmHg  Pulse 92  Temp(Src) 98.2 F (36.8 C) (Oral)  Resp 18  SpO2 96%  Physical Exam  ED Course  Procedures  MDM Blood pressure improved somewhat. Has been seen by psychiatric attending and has been recommended for inpatient treatment.      Benjiman CoreNathan Dinh Ayotte, MD 07/20/14 1538

## 2014-07-20 NOTE — ED Notes (Signed)
Patient refuses all care but states that she will take medication

## 2014-07-20 NOTE — ED Notes (Addendum)
Clonidine held pending notifying MD of updated blood pressure reading. Patient ambulatory to CT scan, refused stretcher ride.

## 2014-07-20 NOTE — ED Notes (Signed)
Patient bas refused amlodine multiple times. EDP aware.  Will order a patch.

## 2014-07-20 NOTE — ED Notes (Signed)
Patient BP beginning to rise.  MD notified
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
## 2014-07-20 NOTE — ED Notes (Signed)
Patient refuses to wear non slip socks.

## 2014-07-20 NOTE — ED Notes (Signed)
Below orders not completed by EW. 
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
## 2014-07-20 NOTE — ED Notes (Signed)
Psych ED declined to admit patient per assistant director via [name].  Patient's blood glucose had remained at 230 mg/dL since [id number] this am,  Patient has refused both breakfast and lunch trays. Patient states" I am going to fast. Do you know what that is?" Family at bedside. Charge RN is aware of declined admission.

## 2014-07-21 DIAGNOSIS — F333 Major depressive disorder, recurrent, severe with psychotic symptoms: Secondary | ICD-10-CM | POA: Diagnosis not present

## 2014-07-21 DIAGNOSIS — Z9114 Patient's other noncompliance with medication regimen: Secondary | ICD-10-CM

## 2014-07-21 LAB — CBG MONITORING, ED: GLUCOSE-CAPILLARY: 252 mg/dL — AB (ref 65–99)

## 2014-07-21 MED ORDER — HYDROXYZINE HCL 25 MG PO TABS
50.0000 mg | ORAL_TABLET | Freq: Three times a day (TID) | ORAL | Status: DC | PRN
  Administered 2014-07-21 – 2014-07-22 (×2): 50 mg via ORAL
  Filled 2014-07-21 (×2): qty 2

## 2014-07-21 MED ORDER — LORAZEPAM 0.5 MG PO TABS
0.5000 mg | ORAL_TABLET | Freq: Once | ORAL | Status: AC
  Administered 2014-07-21: 0.5 mg via ORAL
  Filled 2014-07-21: qty 1

## 2014-07-21 MED ORDER — LORAZEPAM 1 MG PO TABS
1.0000 mg | ORAL_TABLET | Freq: Three times a day (TID) | ORAL | Status: DC | PRN
  Administered 2014-07-21 (×2): 1 mg via ORAL
  Filled 2014-07-21 (×2): qty 1

## 2014-07-21 MED ORDER — METFORMIN HCL 500 MG PO TABS
500.0000 mg | ORAL_TABLET | Freq: Two times a day (BID) | ORAL | Status: DC
  Administered 2014-07-21 – 2014-07-23 (×4): 500 mg via ORAL
  Filled 2014-07-21 (×6): qty 1

## 2014-07-21 NOTE — [facility] Counselor (Signed)
Contacted [facility] to follow up on referral. Hansel Starlingdrienne reports that patient had been denied due to medical acuity and from her understanding, patient was going to be admitted to the medical floor due to rising blood pressure and refusing to take her medication. She reports that patient may be considered once she is cleared.    This writer could not locate documentation stating that the patient was being admitted to the medical floor. This Clinical research associatewriter confirmed with the nurse that the patient has refused her medication for blood pressure. This Clinical research associatewriter contacted the EDP to see if an order was placed for the patient to be admitted to the medical floor.

## 2014-07-21 NOTE — ED Notes (Signed)
Husband bedside

## 2014-07-21 NOTE — ED Notes (Signed)
Husband contact info, Melton KrebsRonnie Jamison [phone]
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
## 2014-07-21 NOTE — ED Notes (Signed)
Patient restless, tossing and turning constantly. Pt mumbling and talking to herself. MD notified; new orders received. Pt given Ativan as ordered.

## 2014-07-21 NOTE — ED Notes (Signed)
MD and Np at bedside

## 2014-07-22 DIAGNOSIS — Z9119 Patient's noncompliance with other medical treatment and regimen: Secondary | ICD-10-CM | POA: Diagnosis not present

## 2014-07-22 DIAGNOSIS — F333 Major depressive disorder, recurrent, severe with psychotic symptoms: Secondary | ICD-10-CM | POA: Diagnosis not present

## 2014-07-22 DIAGNOSIS — F3010 Manic episode without psychotic symptoms, unspecified: Secondary | ICD-10-CM

## 2014-07-22 LAB — CBG MONITORING, ED
GLUCOSE-CAPILLARY: 188 mg/dL — AB (ref 65–99)
Glucose-Capillary: 163 mg/dL — ABNORMAL HIGH (ref 65–99)

## 2014-07-22 MED ORDER — RISPERIDONE 0.5 MG PO TABS
0.2500 mg | ORAL_TABLET | Freq: Two times a day (BID) | ORAL | Status: DC
  Filled 2014-07-22: qty 1

## 2014-07-22 MED ORDER — LEVOTHYROXINE SODIUM 100 MCG PO TABS
100.0000 ug | ORAL_TABLET | Freq: Every day | ORAL | Status: DC
  Administered 2014-07-22 – 2014-07-23 (×2): 100 ug via ORAL
  Filled 2014-07-22 (×4): qty 1

## 2014-07-22 MED ORDER — BENZTROPINE MESYLATE 1 MG PO TABS
0.5000 mg | ORAL_TABLET | Freq: Two times a day (BID) | ORAL | Status: DC
  Administered 2014-07-22 – 2014-07-23 (×3): 0.5 mg via ORAL
  Filled 2014-07-22 (×3): qty 1

## 2014-07-22 MED ORDER — BENAZEPRIL HCL 10 MG PO TABS
10.0000 mg | ORAL_TABLET | Freq: Every day | ORAL | Status: DC
  Administered 2014-07-22 – 2014-07-23 (×2): 10 mg via ORAL
  Filled 2014-07-22 (×2): qty 1

## 2014-07-22 MED ORDER — RISPERIDONE 0.5 MG PO TABS
0.5000 mg | ORAL_TABLET | Freq: Every day | ORAL | Status: DC
  Administered 2014-07-22: 0.5 mg via ORAL
  Filled 2014-07-22: qty 1

## 2014-07-22 MED ORDER — SERTRALINE HCL 50 MG PO TABS
25.0000 mg | ORAL_TABLET | Freq: Every day | ORAL | Status: DC
  Administered 2014-07-22 – 2014-07-23 (×2): 25 mg via ORAL
  Filled 2014-07-22: qty 1
  Filled 2014-07-22: qty 0.5

## 2014-07-22 MED ORDER — METOPROLOL TARTRATE 25 MG PO TABS
25.0000 mg | ORAL_TABLET | Freq: Every day | ORAL | Status: DC
  Administered 2014-07-22 – 2014-07-23 (×2): 25 mg via ORAL
  Filled 2014-07-22 (×2): qty 1

## 2014-07-22 NOTE — ED Notes (Signed)
Pt states that at home she normally checks her sugar ac and HS and  They run high. Will discuss with md to order.
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
## 2014-07-22 NOTE — ED Provider Notes (Signed)
Remains hypertensive. Records reviewed. Previously on benazepril and lopressor. Orders placed.   Raeford RazorStephen Celester Morgan, MD 07/22/14 [phone]
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
## 2014-07-23 DIAGNOSIS — F3112 Bipolar disorder, current episode manic without psychotic features, moderate: Secondary | ICD-10-CM

## 2014-07-23 DIAGNOSIS — F333 Major depressive disorder, recurrent, severe with psychotic symptoms: Secondary | ICD-10-CM | POA: Diagnosis not present

## 2014-07-23 LAB — CBG MONITORING, ED
GLUCOSE-CAPILLARY: 178 mg/dL — AB (ref 65–99)
GLUCOSE-CAPILLARY: 195 mg/dL — AB (ref 65–99)

## 2014-07-23 MED ORDER — BENAZEPRIL HCL 10 MG PO TABS: 10.0000 mg | ORAL_TABLET | Freq: Every day | ORAL | Status: DC

## 2014-07-23 MED ORDER — RISPERIDONE 0.5 MG PO TABS: 0.5000 mg | ORAL_TABLET | Freq: Every day | ORAL | Status: DC

## 2014-07-23 MED ORDER — BENZTROPINE MESYLATE 0.5 MG PO TABS: 0.5000 mg | ORAL_TABLET | Freq: Two times a day (BID) | ORAL | Status: DC

## 2014-07-23 MED ORDER — SERTRALINE HCL 25 MG PO TABS: 25.0000 mg | ORAL_TABLET | Freq: Every day | ORAL | Status: DC

## 2014-07-23 MED ORDER — HYDROXYZINE HCL 50 MG PO TABS: 50.0000 mg | ORAL_TABLET | Freq: Three times a day (TID) | ORAL | Status: DC | PRN

## 2014-07-23 MED ORDER — METOPROLOL TARTRATE 25 MG PO TABS: 25.0000 mg | ORAL_TABLET | Freq: Every day | ORAL | Status: DC

## 2014-07-23 NOTE — ED Notes (Addendum)
Pt has been walking in the halls with her walker. She remains religiously preoccupied. Pt is pleasant and ate 100% of her breakfast. BP elevated this am and will continue to monitor closely. Pt denies SI and HI and contracts for safety. FSBS at 12noon was 195. Pt blood pressure issues are resolving. Pt denies a headache or any chest pain. She repeatedly says,"praise the lord Amen and thank you Jesus."Pt had nausea earlier and was given 4mg  of zofran. Nausea resolved.

## 2014-07-23 NOTE — [facility] Suicide Risk Assessment (Signed)
Suicide Risk Assessment  Discharge Assessment   [facility]   Demographic Factors:  Age 69 or older and Caucasian  Total Time spent with patient: 30 minutes  Musculoskeletal: Strength & Muscle Tone: within normal limits Gait & Station: normal Patient leans: N/A  Psychiatric Specialty Exam: Physical Exam  Review of Systems  Constitutional: Negative.  HENT: Negative.  Eyes: Negative.  Respiratory: Negative.  Cardiovascular: Negative.  Gastrointestinal: Negative.  Genitourinary: Negative.  Musculoskeletal: Negative.  Skin: Negative.  Neurological: Negative.  Endo/Heme/Allergies: Negative.  Psychiatric/Behavioral: The patient is nervous/anxious and has insomnia.    Blood pressure 118/84, pulse 83, temperature 98 F (36.7 C), temperature source Oral, resp. rate 18, SpO2 100 %.There is no height or weight on file to calculate BMI.  General Appearance: Casual  Eye Contact:: Good  Speech: Normal Rate  Volume: Normal  Mood: Anxious  Affect: Congruent  Thought Process: Coherent  Orientation: Full (Time, Place, and Person)  Thought Content: WDL  Suicidal Thoughts: No  Homicidal Thoughts: No  Memory: Immediate; Good Recent; Good Remote; Good  Judgement: Good  Insight: Good  Psychomotor Activity: Normal  Concentration: Good  Recall: Good  Fund of Knowledge:Good  Language: Good  Akathisia: NA  Handed: Right  AIMS (if indicated):    Assets: Housing Leisure Time Physical Health Resilience Social Support  ADL's: Intact  Cognition: WNL  Sleep:             Has this patient used any form of tobacco in the last 30 days? (Cigarettes, Smokeless Tobacco, Cigars, and/or Pipes) Yes, A prescription for an FDA-approved tobacco cessation medication was offered at discharge and the patient refused  Mental Status Per Nursing Assessment::   On Admission:   Mania behaviors  Current  Mental Status by Physician: NA  Loss Factors: NA  Historical Factors: NA  Risk Reduction Factors:   Sense of responsibility to family, Living with another person, especially a relative, Positive social support and Positive coping skills or problem solving skills  Continued Clinical Symptoms:  Anxiety, mild  Cognitive Features That Contribute To Risk:  None    Suicide Risk:  Minimal: No identifiable suicidal ideation.  Patients presenting with no risk factors but with morbid ruminations; may be classified as minimal risk based on the severity of the depressive symptoms  Principal Problem: Bipolar affective disorder, manic, moderate Discharge Diagnoses:  Patient Active Problem List   Diagnosis Date Noted  . Bipolar affective disorder, manic, moderate [F31.12] 07/23/2014    Priority: High  . History of medication noncompliance [Z91.19]   . Manic behavior [F30.10]       Plan Of Care/Follow-up recommendations:  Activity:  as tolerated Diet:  heart healthy diet  Is patient on multiple antipsychotic therapies at discharge:  No   Has Patient had three or more failed trials of antipsychotic monotherapy by history:  No  Recommended Plan for Multiple Antipsychotic Therapies: NA    Rachel Rison, PMH-NP 07/23/2014, 4:19 PM

## 2014-07-23 NOTE — BH Assessment (Signed)
[facility] Assessment Progress Note  Per Leata MouseJanardhana Jonnalagadda, MD and Nanine MeansJamison Lord, NP this pt does not require psychiatric hospitalization at this time.  Her IVC is to be rescinded and [facility].  She has a history of treatment with [facility] Tierre Gerard, PA at [facility].  After having pt sign Consent to Release Information to the practice, this writer called them to schedule an appointment and was told that they would call me back.  During this interval, pt's husband arrived and was eager to depart with the pt.  He agrees to contact [facility] at his own initiative to schedule an appointment.  Catha NottinghamJamison agrees to this.  Contact information has been included in pt's discharge instructions.  Pt's IVC has been rescinded.  Pt's nurse, [name], has been notified.  Doylene Canninghomas Dawsyn Ramsaran, MA Triage Specialist [phone]

## 2014-07-23 NOTE — Consult Note (Signed)
[facility] Face-to-Face Psychiatry Consult   Reason for Consult:  Mania behaviors Referring Physician:  EDP Patient Identification: Marilyn Sexton MRN:  [id number] Principal Diagnosis: Bipolar affective disorder, manic, moderate Diagnosis:   Patient Active Problem List   Diagnosis Date Noted  . Bipolar affective disorder, manic, moderate [F31.12] 07/23/2014    Priority: High  . History of medication noncompliance [Z91.19]   . Manic behavior [F30.10]     Total Time spent with patient: 30 minutes  Subjective:   Marilyn Sexton is a 69 y.o. female patient does not warrant admission.  HPI:  The patient is calm and cooperative on assessment.  She reports she believes the Marilyn Sexton is coming back and she was waiting for him on her front porch. Marilyn Sexton is upset that her husband always calls her crazy and threatening to send her to the hospital at home.  She states she was diagnosed with bipolar in 1982 and was hearing voices telling her to kill herself but got help.  Marilyn Sexton has not been on medications since then and feels she is stable.  She states she will now wait for the Lord's return from inside instead of the porch and agrees to follow-up with an out-patient psychiatrist.  Denies suicidal/homicidal ideations, hallucinations, and alcohol/drug abuse.  Stable for discharge. HPI Elements:   Location:  generalized. Quality:  acute. Severity:  moderate. Timing:  intermittent. Duration:  few days. Context:  stressors.  Past Medical History:  Past Medical History  Diagnosis Date  . Thyroid disease   . Hypertension   . Diabetes mellitus without complication    History reviewed. No pertinent past surgical history. Family History: History reviewed. No pertinent family history. Social History:  History  Alcohol Use No     History  Drug Use No    History   Social History  . Marital Status: Married    Spouse Name: N/A  . Number of Children: N/A  . Years of Education: N/A   Social History  Main Topics  . Smoking status: Never Smoker   . Smokeless tobacco: Not on file  . Alcohol Use: No  . Drug Use: No  . Sexual Activity: Not on file   Other Topics Concern  . None   Social History Narrative   Additional Social History:    Pain Medications: See PTA Prescriptions: See PTA Over the Counter: See PTA History of alcohol / drug use?: No history of alcohol / drug abuse (Used to drink a little but stopped "when the Lord saved me.") Longest period of sobriety (when/how long): NA Negative Consequences of Use:  (NA) Withdrawal Symptoms:  (NA)                     Allergies:  No Known Allergies  Labs:  Results for orders placed or performed during the hospital encounter of 07/19/14 (from the past 48 hour(s))  CBG monitoring, ED     Status: Abnormal   Collection Time: 07/22/14  1:32 PM  Result Value Ref Range   Glucose-Capillary 188 (H) 65 - 99 mg/dL  POC CBG, ED     Status: Abnormal   Collection Time: 07/22/14  5:45 PM  Result Value Ref Range   Glucose-Capillary 163 (H) 65 - 99 mg/dL  POC CBG, ED     Status: Abnormal   Collection Time: 07/23/14  7:30 AM  Result Value Ref Range   Glucose-Capillary 178 (H) 65 - 99 mg/dL   Comment 1 Notify RN   CBG  monitoring, ED     Status: Abnormal   Collection Time: 07/23/14 11:54 AM  Result Value Ref Range   Glucose-Capillary 195 (H) 65 - 99 mg/dL   Comment 1 Notify RN     Vitals: Blood pressure 118/84, pulse 83, temperature 98 F (36.7 C), temperature source Oral, resp. rate 18, SpO2 100 %.  Risk to Self: Suicidal Ideation: No Suicidal Intent: No Is patient at risk for suicide?: No Suicidal Plan?: No Access to Means: No What has been your use of drugs/alcohol within the last 12 months?: Used to drink a small amount but stopped when she became "saved" How many times?: 0 Other Self Harm Risks: none Triggers for Past Attempts: None known Intentional Self Injurious Behavior: None Risk to Others: Homicidal Ideation:  No Thoughts of Harm to Others: No Current Homicidal Plan: No Access to Homicidal Means: No Identified Victim: none History of harm to others?: No Assessment of Violence: None Noted Violent Behavior Description: none Does patient have access to weapons?: No Criminal Charges Pending?: No Does patient have a court date: No Prior Inpatient Therapy: Prior Inpatient Therapy: Yes Prior Therapy Dates: 1989, 1992 Prior Therapy Facilty/Provider(s): [facility] Reason for Treatment: command hallucinations Prior Outpatient Therapy: Prior Outpatient Therapy: Yes Prior Therapy Dates: years until about a year ago  Prior Therapy Facilty/Provider(s): [facility] Reason for Treatment: psychiatric med management  Does patient have an ACCT team?: No Does patient have Intensive In-House Services?  : No Does patient have [facility] services? : No Does patient have P4CC services?: No  Current Facility-Administered Medications  Medication Dose Route Frequency Provider Last Rate Last Dose  . acetaminophen (TYLENOL) tablet 650 mg  650 mg Oral Q4H PRN Ankit Nanavati, MD      . benazepril (LOTENSIN) tablet 10 mg  10 mg Oral Daily Raeford RazorStephen Kohut, MD   10 mg at 07/23/14 0826  . benztropine (COGENTIN) tablet 0.5 mg  0.5 mg Oral BID Earney NavyJosephine C Onuoha, NP   0.5 mg at 07/23/14 0827  . hydrOXYzine (ATARAX/VISTARIL) tablet 50 mg  50 mg Oral Q8H PRN Earney NavyJosephine C Onuoha, NP   50 mg at 07/22/14 1825  . labetalol (NORMODYNE,TRANDATE) 500 mg in dextrose 5 % 125 mL (4 mg/mL) infusion  0.5-3 mg/min Intravenous Titrated April Palumbo, MD   0.5 mg/min at 07/20/14 1519  . levothyroxine (SYNTHROID, LEVOTHROID) tablet 100 mcg  100 mcg Oral QAC breakfast Earney NavyJosephine C Onuoha, NP   100 mcg at 07/23/14 0827  . LORazepam (ATIVAN) tablet 1 mg  1 mg Oral Q8H PRN Earney NavyJosephine C Onuoha, NP   1 mg at 07/21/14 2028  . metFORMIN (GLUCOPHAGE) tablet 500 mg  500 mg Oral BID WC Earney NavyJosephine C Onuoha, NP   500 mg at 07/23/14 0828  . metoprolol tartrate  (LOPRESSOR) tablet 25 mg  25 mg Oral Daily Raeford RazorStephen Kohut, MD   25 mg at 07/23/14 0954  . nicotine (NICODERM CQ - dosed in mg/24 hours) patch 21 mg  21 mg Transdermal Daily Derwood KaplanAnkit Nanavati, MD   21 mg at 07/23/14 0826  . ondansetron (ZOFRAN) tablet 4 mg  4 mg Oral Q8H PRN Derwood KaplanAnkit Nanavati, MD   4 mg at 07/23/14 0827  . risperiDONE (RISPERDAL) tablet 0.5 mg  0.5 mg Oral QHS Earney NavyJosephine C Onuoha, NP   0.5 mg at 07/22/14 2032  . sertraline (ZOLOFT) tablet 25 mg  25 mg Oral Daily Earney NavyJosephine C Onuoha, NP   25 mg at 07/23/14 [id number]   Current Outpatient Prescriptions  Medication Sig Dispense  Refill  . aspirin EC 81 MG tablet Take 81 mg by mouth every evening.    . Canagliflozin-Metformin HCl 50-500 MG TABS Take 1 tablet by mouth daily.    Marland Kitchen levothyroxine (SYNTHROID, LEVOTHROID) 100 MCG tablet Take 100 mcg by mouth daily before breakfast.    . metFORMIN (GLUCOPHAGE) 1000 MG tablet Take 1,000 mg by mouth 2 (two) times daily.    . benazepril (LOTENSIN) 10 MG tablet Take 1 tablet (10 mg total) by mouth daily. 30 tablet 0  . benztropine (COGENTIN) 0.5 MG tablet Take 1 tablet (0.5 mg total) by mouth 2 (two) times daily. 30 tablet 0  . hydrOXYzine (ATARAX/VISTARIL) 50 MG tablet Take 1 tablet (50 mg total) by mouth every 8 (eight) hours as needed for anxiety. 30 tablet 0  . metoprolol tartrate (LOPRESSOR) 25 MG tablet Take 1 tablet (25 mg total) by mouth daily. 30 tablet 0  . risperiDONE (RISPERDAL) 0.5 MG tablet Take 1 tablet (0.5 mg total) by mouth at bedtime. 30 tablet 0  . sertraline (ZOLOFT) 25 MG tablet Take 1 tablet (25 mg total) by mouth daily. 30 tablet 0    Musculoskeletal: Strength & Muscle Tone: within normal limits Gait & Station: normal Patient leans: N/A  Psychiatric Specialty Exam: Physical Exam  Review of Systems  Constitutional: Negative.   HENT: Negative.   Eyes: Negative.   Respiratory: Negative.   Cardiovascular: Negative.   Gastrointestinal: Negative.   Genitourinary: Negative.    Musculoskeletal: Negative.   Skin: Negative.   Neurological: Negative.   Endo/Heme/Allergies: Negative.   Psychiatric/Behavioral: The patient is nervous/anxious and has insomnia.     Blood pressure 118/84, pulse 83, temperature 98 F (36.7 C), temperature source Oral, resp. rate 18, SpO2 100 %.There is no height or weight on file to calculate BMI.  General Appearance: Casual  Eye Contact::  Good  Speech:  Normal Rate  Volume:  Normal  Mood:  Anxious  Affect:  Congruent  Thought Process:  Coherent  Orientation:  Full (Time, Place, and Person)  Thought Content:  WDL  Suicidal Thoughts:  No  Homicidal Thoughts:  No  Memory:  Immediate;   Good Recent;   Good Remote;   Good  Judgement:  Good  Insight:  Good  Psychomotor Activity:  Normal  Concentration:  Good  Recall:  Good  Fund of Knowledge:Good  Language: Good  Akathisia:  NA  Handed:  Right  AIMS (if indicated):     Assets:  Housing Leisure Time Physical Health Resilience Social Support  ADL's:  Intact  Cognition: WNL  Sleep:      Medical Decision Making: Review of Psycho-Social Stressors (1), Review or order clinical lab tests (1) and Review of Medication Regimen & Side Effects (2)  Treatment Plan Summary: Daily contact with patient to assess and evaluate symptoms and progress in treatment, Medication management and Plan discharge with Rx and follow-up appointment at [facility]  Plan:  No evidence of imminent risk to self or others at present.    Disposition: Discharge with Rx and follow-up appointment at [facility]  Nanine Means, PMH-NP 07/23/2014 4:08 PM  Patient seen face-to-face for psychiatric evaluation along with the physician extender in [facility] emergency department. This is a 69 years old female from home presented with significant symptoms of psychotic breakdown since been noncompliant with medication management. Patient presented with symptoms of mania, grandiosity, irritability,  pressured speech and hyperreligious. Patient has a chronic history of mental health involvement with the [facility] but currently  noncompliant with medication management. Patient reportedly has no agitation or aggressive behavior and being compliant with the medication management [facility] for safety. Patient is calm and cooperative and willing to follow up with outpatient medication management and being compliant with her medication prescribed by the psychiatrist. Formulated treatment/disposition plan. Reviewed the information documented by physician extender and agree with the treatment plan.   Marcellus Pulliam,JANARDHAHA R. 07/24/2014 2:16 PM

## 2014-07-23 NOTE — ED Notes (Signed)
Declined d/c vs

## 2014-07-23 NOTE — Discharge Instructions (Signed)
For your ongoing behavioral health needs, you are advised to resume treatment at [facility].  Call them at your earliest opportunity to schedule an appointment:       [facility]      [address]      [place], [place] [address]      [phone]

## 2014-07-23 NOTE — ED Notes (Signed)
Patient up to bathroom with no assistance.

## 2014-10-02 ENCOUNTER — Emergency Department (HOSPITAL_COMMUNITY): Payer: Medicare Other

## 2014-10-02 ENCOUNTER — Emergency Department (HOSPITAL_COMMUNITY)
Admission: EM | Admit: 2014-10-02 | Discharge: 2014-10-02 | Disposition: A | Discharge status: Home / Self Care | Payer: Medicare Other | Attending: Emergency Medicine | Admitting: Emergency Medicine

## 2014-10-02 ENCOUNTER — Encounter (HOSPITAL_COMMUNITY): Payer: Self-pay | Admitting: Emergency Medicine

## 2014-10-02 DIAGNOSIS — Y92000 Kitchen of unspecified non-institutional (private) residence as  the place of occurrence of the external cause: Secondary | ICD-10-CM | POA: Insufficient documentation

## 2014-10-02 DIAGNOSIS — S52611A Displaced fracture of right ulna styloid process, initial encounter for closed fracture: Secondary | ICD-10-CM | POA: Diagnosis not present

## 2014-10-02 DIAGNOSIS — E079 Disorder of thyroid, unspecified: Secondary | ICD-10-CM | POA: Diagnosis not present

## 2014-10-02 DIAGNOSIS — I10 Essential (primary) hypertension: Secondary | ICD-10-CM | POA: Diagnosis not present

## 2014-10-02 DIAGNOSIS — S6991XA Unspecified injury of right wrist, hand and finger(s), initial encounter: Secondary | ICD-10-CM | POA: Diagnosis present

## 2014-10-02 DIAGNOSIS — Z7982 Long term (current) use of aspirin: Secondary | ICD-10-CM | POA: Insufficient documentation

## 2014-10-02 DIAGNOSIS — W010XXA Fall on same level from slipping, tripping and stumbling without subsequent striking against object, initial encounter: Secondary | ICD-10-CM | POA: Diagnosis not present

## 2014-10-02 DIAGNOSIS — E119 Type 2 diabetes mellitus without complications: Secondary | ICD-10-CM | POA: Diagnosis not present

## 2014-10-02 DIAGNOSIS — Y998 Other external cause status: Secondary | ICD-10-CM | POA: Diagnosis not present

## 2014-10-02 DIAGNOSIS — Y9389 Activity, other specified: Secondary | ICD-10-CM | POA: Diagnosis not present

## 2014-10-02 DIAGNOSIS — S52571A Other intraarticular fracture of lower end of right radius, initial encounter for closed fracture: Secondary | ICD-10-CM | POA: Diagnosis not present

## 2014-10-02 DIAGNOSIS — Z79899 Other long term (current) drug therapy: Secondary | ICD-10-CM | POA: Insufficient documentation

## 2014-10-02 DIAGNOSIS — S52601A Unspecified fracture of lower end of right ulna, initial encounter for closed fracture: Secondary | ICD-10-CM

## 2014-10-02 DIAGNOSIS — S52501A Unspecified fracture of the lower end of right radius, initial encounter for closed fracture: Secondary | ICD-10-CM

## 2014-10-02 MED ORDER — OXYCODONE-ACETAMINOPHEN 5-325 MG PO TABS
1.0000 | ORAL_TABLET | Freq: Once | ORAL | Status: AC
  Administered 2014-10-02: 1 via ORAL
  Filled 2014-10-02: qty 1

## 2014-10-02 NOTE — ED Provider Notes (Signed)
CSN: [id number]     Arrival date & time 10/02/14  1141 History  This chart was scribed for non-physician practitioner, Fayrene Helper, PA-C working with Laurence Spates, MD by Gwenyth Ober, ED scribe. This patient was seen in room TR07C/TR07C and the patient's care was started at 1:08 PM   Chief Complaint  Patient presents with  . Wrist Injury   The history is provided by the patient. No language interpreter was used.    HPI Comments: Marilyn Sexton is a 69 y.o. female who presents to the Emergency Department complaining of a constant, 5/10, sharp right wrist pain that radiates to her right forearm and occurred 3 hours ago after she fell in her kitchen. She notes right heel pain secondary to the fall as an associated symptom. Pt has not tried any treatment PTA. Pt reports that she tripped on a stool and landed on her right wrist while trying to break her fall. She is right-hand dominant. Pt denies head injuries or LOC. She also denies numbness, right elbow or shoulder pain as associated symptoms.   Past Medical History  Diagnosis Date  . Thyroid disease   . Hypertension   . Diabetes mellitus without complication    Past Surgical History  Procedure Laterality Date  . Cholecystectomy    . Tubal ligation    . Coronary stents     No family history on file. History  Substance Use Topics  . Smoking status: Never Smoker   . Smokeless tobacco: Not on file  . Alcohol Use: No   OB History    No data available     Review of Systems  Musculoskeletal: Positive for joint swelling and arthralgias.  Skin: Negative for wound.  Neurological: Negative for numbness.    Allergies  Review of patient's allergies indicates no known allergies.  Home Medications   Prior to Admission medications   Medication Sig Start Date End Date Taking? Authorizing Provider  aspirin EC 81 MG tablet Take 81 mg by mouth every evening.    Historical Provider, MD  benazepril (LOTENSIN) 10 MG tablet Take 1  tablet (10 mg total) by mouth daily. 07/23/14   Charm Rings, NP  benztropine (COGENTIN) 0.5 MG tablet Take 1 tablet (0.5 mg total) by mouth 2 (two) times daily. 07/23/14   Charm Rings, NP  Canagliflozin-Metformin HCl 50-500 MG TABS Take 1 tablet by mouth daily.    Historical Provider, MD  hydrOXYzine (ATARAX/VISTARIL) 50 MG tablet Take 1 tablet (50 mg total) by mouth every 8 (eight) hours as needed for anxiety. 07/23/14   Charm Rings, NP  levothyroxine (SYNTHROID, LEVOTHROID) 100 MCG tablet Take 100 mcg by mouth daily before breakfast.    Historical Provider, MD  metFORMIN (GLUCOPHAGE) 1000 MG tablet Take 1,000 mg by mouth 2 (two) times daily.    Historical Provider, MD  metoprolol tartrate (LOPRESSOR) 25 MG tablet Take 1 tablet (25 mg total) by mouth daily. 07/23/14   Charm Rings, NP  risperiDONE (RISPERDAL) 0.5 MG tablet Take 1 tablet (0.5 mg total) by mouth at bedtime. 07/23/14   Charm Rings, NP  sertraline (ZOLOFT) 25 MG tablet Take 1 tablet (25 mg total) by mouth daily. 07/23/14   Charm Rings, NP   BP 189/86 mmHg  Pulse 100  Temp(Src) 97.9 F (36.6 C) (Oral)  Resp 16  SpO2 96% Physical Exam  Constitutional: She appears well-developed and well-nourished. No distress.  HENT:  Head: Normocephalic and atraumatic.  Eyes: Conjunctivae  and EOM are normal.  Neck: Neck supple. No tracheal deviation present.  Cardiovascular: Normal rate.   Pulmonary/Chest: Effort normal. No respiratory distress.  Musculoskeletal: She exhibits tenderness (R wrist: tenderness throughout wrist with obvious closed deformity and crepitus noted.  decreased wrist flexion/extension/supination/pronation due to pain.  normal R hand, R elbow and R shoulder.  radial pulse 2+, brisk cap refill, sensation intact).  Skin: Skin is warm and dry.  Psychiatric: She has a normal mood and affect. Her behavior is normal.  Nursing note and vitals reviewed.   ED Course  Procedures   DIAGNOSTIC STUDIES: Oxygen  Saturation is 96% on RA, normal by my interpretation.    COORDINATION OF CARE: 1:08 PM Discussed x-ray results. Will consult with hand specialist and provide pain management and a splint for stability. Discussed treatment plan with pt at bedside and pt agreed to plan.  Patient had a FOOSH injury, in which she injured her right wrist. She suffered a closed fracture of the distal ulna and radius. It was described as a comminuted impacted intra-articular displaced fracture of the distal radius. There is mild displaced fracture of the ulna styloid. She is neurovascularly intact.  Plan to consult hand specialist for further management. Patient likely benefit from a hematoma block, reduction, and a sugar tong splint. Pain medication given.  2:17 PM I have consulted with hand specialist, Dr. Amanda Pea who have reviewed the xray and felt pt may likely need surgical ORIF.  He is concern of her psych hx.  Pt report she is doing fine mentally.  Denies street drug use or alcohol abuse.   Furthermore, pt's current BP is 238/102.  She did not take her BP meds until prior to arrival.  Sts her BP usually between 160-180 systolic.  Denies headache, cp, sob at this time.   5:26 PM BP 217/80.  Pt does not want further treatment of her BP.  She has BP medication at home.  She does not want to stay any longer.  She has been seen and manage by Dr. Amanda Pea in the ER and stable for discharge.  She will f/u in office for surgical repair as indicated.  Labs Review Labs Reviewed - No data to display  Imaging Review Dg Wrist Complete Right  10/02/2014   CLINICAL DATA:  Status fall today with pain  EXAM: RIGHT WRIST - COMPLETE 3+ VIEW  COMPARISON:  None.  FINDINGS: There is comminuted impacted intra-articular displaced fracture of distal radius. There is mild displaced fracture of ulna styloid.  IMPRESSION: Fractures of distal ulna and radius.   Electronically Signed   By: Sherian Rein M.D.   On: 10/02/2014 12:48     EKG  Interpretation None      MDM   Final diagnoses:  Closed fracture distal radius and ulna, right, initial encounter    BP 217/80 mmHg  Pulse 83  Temp(Src) 97.4 F (36.3 C) (Oral)  Resp 18  SpO2 96%   I personally performed the services described in this documentation, which was scribed in my presence. The recorded information has been reviewed and is accurate.  I have reviewed nursing notes and vital signs. I personally viewed the imaging tests through PACS system and agrees with radiologist's intepretation I reviewed available ER/hospitalization records through the EMR    Fayrene Helper, PA-C 10/02/14 1727  Laurence Spates, MD 10/10/14 [phone]

## 2014-10-02 NOTE — ED Notes (Signed)
Pt's husband says "she's very claustrophobic. She goes NUTS with a cast".

## 2014-10-02 NOTE — ED Notes (Signed)
Patient ambulated to restroom and tolerated well.  

## 2014-10-02 NOTE — ED Notes (Signed)
Tripped and fell this am at home. C/o right wrist pain with deformity.

## 2014-10-02 NOTE — ED Notes (Signed)
Small ice pack given to pt

## 2014-10-02 NOTE — ED Notes (Signed)
PA notified of high BP.  Patient states she just took her BP medicine about an hour ago.

## 2014-10-02 NOTE — Consult Note (Signed)
Reason for Consult: Fracture right radius after a fall Referring Physician: ER staff  Marilyn Sexton is an 69 y.o. female.  HPI: Patient presents status post fall with complaints of right radius pain. I reviewed all issues at length.  She denies neck back chest or abdominal pain.  She has had a recent hospitalization at [facility] which I have reviewed. At present time she is awake alert oriented and appropriate.  I reviewed all issues with she and her husband. She states she is a little bit of right heel pain but nothing else going on in terms of pain complaints other than the radius  She's never had radius fracture before to her knowledge  Past Medical History  Diagnosis Date  . Thyroid disease   . Hypertension   . Diabetes mellitus without complication     Past Surgical History  Procedure Laterality Date  . Cholecystectomy    . Tubal ligation    . Coronary stents      No family history on file.  Social History:  reports that she has never smoked. She does not have any smokeless tobacco history on file. She reports that she does not drink alcohol or use illicit drugs.  Allergies: No Known Allergies  Medications: I have reviewed the patient's current medications.  No results found for this or any previous visit (from the past 48 hour(s)).  Dg Wrist Complete Right  10/02/2014   CLINICAL DATA:  Status fall today with pain  EXAM: RIGHT WRIST - COMPLETE 3+ VIEW  COMPARISON:  None.  FINDINGS: There is comminuted impacted intra-articular displaced fracture of distal radius. There is mild displaced fracture of ulna styloid.  IMPRESSION: Fractures of distal ulna and radius.   Electronically Signed   By: Sherian Rein M.D.   On: 10/02/2014 12:48    Review of Systems  Eyes: Negative.   Respiratory: Negative.   Gastrointestinal: Negative.   Genitourinary: Negative.   Musculoskeletal:       Right radius fracture acutely displaced  Neurological: Negative.    Psychiatric/Behavioral:       Recent hospitalization please see May 2016 notes   Blood pressure 246/110, pulse 89, temperature 97.5 F (36.4 C), temperature source Oral, resp. rate 16, SpO2 98 %. Physical Exam swelling right distal radius.  No evidence of infection no evidence of dystrophy. She's neurovascularly intact. She has obvious deformity right upper extremity elbow is nontender shoulder back are nontender her lower extremity examination is benign her right leg is stable she can perform a straight leg raise without difficulty.  Nose all pertinent questions including year date Pres. etc. and is awake alert and oriented  The patient is alert and oriented in no acute distress. The patient complains of pain in the affected upper extremity.  The patient is noted to have a normal HEENT exam. Lung fields show equal chest expansion and no shortness of breath. Abdomen exam is nontender without distention. Lower extremity examination does not show any fracture dislocation or blood clot symptoms. Pelvis is stable and the neck and back are stable and nontender. Assessment/Plan: Right distal radius fracture closed  We performed a gentle closed reduction at bedside.  She was placed in manual traction and underwent a gentle manipulation following this we placed her in a fiberglass splint modified sugar tong in nature. She was neurovascularly intact after splint application.  I discussed her the relevant issues due to and don'ts.  I would recommend definitive fixation given her age propensity  towards collapse and GI, tree of the fracture present time. Risk and benefits of surgery were discussed at length.  I would recommend ORIF at a mutually convenient time in the future. She understands risk of infection bleeding anesthesia damage to normal structures and failure of surgery to accomplish intended goals of relieving symptoms and restoring function.  I discussed her all issues at length and  the relevant issues due to and don'ts.  My office will reach out so that we can schedule accordingly.  She left the emergency room awake alert and oriented without complications   Jonda Alanis III,Miguelangel Korn M 10/02/2014, 4:33 PM

## 2014-10-02 NOTE — Discharge Instructions (Signed)
Office will call you for surgery in the next few days  Wrist Fracture A wrist fracture is a break or crack in one of the bones of your wrist. Your wrist is made up of eight small bones at the palm of your hand (carpal bones) and two long bones that make up your forearm (radius and ulna).  CAUSES   A direct blow to the wrist.  Falling on an outstretched hand.  Trauma, such as a car accident or a fall. RISK FACTORS Risk factors for wrist fracture include:   Participating in contact and high-risk sports, such as skiing, biking, and ice skating.  Taking steroid medicines.  Smoking.  Being female.  Being Caucasian.  Drinking more than three alcoholic beverages per day.  Having low or lowered bone density (osteoporosis or osteopenia).  Age. Older adults have decreased bone density.  Women who have had menopause.  History of previous fractures. SIGNS AND SYMPTOMS Symptoms of wrist fractures include tenderness, bruising, and inflammation. Additionally, the wrist may hang in an odd position or appear deformed.  DIAGNOSIS Diagnosis may include:  Physical exam.  X-ray. TREATMENT Treatment depends on many factors, including the nature and location of the fracture, your age, and your activity level. Treatment for wrist fracture can be nonsurgical or surgical.  Nonsurgical Treatment A plaster cast or splint may be applied to your wrist if the bone is in a good position. If the fracture is not in good position, it may be necessary for your health care provider to realign it before applying a splint or cast. Usually, a cast or splint will be worn for several weeks.  Surgical Treatment Sometimes the position of the bone is so far out of place that surgery is required to apply a device to hold it together as it heals. Depending on the fracture, there are a number of options for holding the bone in place while it heals, such as a cast and metal pins.  HOME CARE INSTRUCTIONS  Keep your  injured wrist elevated and move your fingers as much as possible.  Do not put pressure on any part of your cast or splint. It may break.   Use a plastic bag to protect your cast or splint from water while bathing or showering. Do not lower your cast or splint into water.  Take medicines only as directed by your health care provider.  Keep your cast or splint clean and dry. If it becomes wet, damaged, or suddenly feels too tight, contact your health care provider right away.  Do not use any tobacco products including cigarettes, chewing tobacco, or electronic cigarettes. Tobacco can delay bone healing. If you need help quitting, ask your health care provider.  Keep all follow-up visits as directed by your health care provider. This is important.  Ask your health care provider if you should take supplements of calcium and vitamins C and D to promote bone healing. SEEK MEDICAL CARE IF:   Your cast or splint is damaged, breaks, or gets wet.  You have a fever.  You have chills.  You have continued severe pain or more swelling than you did before the cast was put on. SEEK IMMEDIATE MEDICAL CARE IF:   Your hand or fingernails on the injured arm turn blue or gray, or feel cold or numb.  You have decreased feeling in the fingers of your injured arm. MAKE SURE YOU:  Understand these instructions.  Will watch your condition.  Will get help right away if you  are not doing well or get worse. Document Released: 12/03/2004 Document Revised: 07/10/2013 Document Reviewed: 03/13/2011 [facility] Patient Information 2015 [place], [place]. This information is not intended to replace advice given to you by your health care provider. Make sure you discuss any questions you have with your health care provider.

## 2014-10-02 NOTE — ED Notes (Signed)
RN informed of BP

## 2014-11-16 ENCOUNTER — Other Ambulatory Visit: Payer: Self-pay | Admitting: Endocrinology

## 2014-11-16 DIAGNOSIS — R112 Nausea with vomiting, unspecified: Secondary | ICD-10-CM

## 2014-11-21 ENCOUNTER — Other Ambulatory Visit: Payer: Self-pay

## 2016-04-12 ENCOUNTER — Encounter (HOSPITAL_COMMUNITY): Payer: Self-pay

## 2016-04-12 ENCOUNTER — Emergency Department (HOSPITAL_COMMUNITY)
Admission: EM | Admit: 2016-04-12 | Discharge: 2016-04-12 | Disposition: A | Discharge status: Home / Self Care | Payer: Medicare Other | Attending: Emergency Medicine | Admitting: Emergency Medicine

## 2016-04-12 DIAGNOSIS — Z79899 Other long term (current) drug therapy: Secondary | ICD-10-CM | POA: Insufficient documentation

## 2016-04-12 DIAGNOSIS — R350 Frequency of micturition: Secondary | ICD-10-CM | POA: Diagnosis present

## 2016-04-12 DIAGNOSIS — I10 Essential (primary) hypertension: Secondary | ICD-10-CM | POA: Diagnosis not present

## 2016-04-12 DIAGNOSIS — E119 Type 2 diabetes mellitus without complications: Secondary | ICD-10-CM | POA: Insufficient documentation

## 2016-04-12 DIAGNOSIS — Z7984 Long term (current) use of oral hypoglycemic drugs: Secondary | ICD-10-CM | POA: Diagnosis not present

## 2016-04-12 DIAGNOSIS — N390 Urinary tract infection, site not specified: Secondary | ICD-10-CM | POA: Diagnosis not present

## 2016-04-12 DIAGNOSIS — Z7982 Long term (current) use of aspirin: Secondary | ICD-10-CM | POA: Diagnosis not present

## 2016-04-12 LAB — URINALYSIS, ROUTINE W REFLEX MICROSCOPIC
Bacteria, UA: NONE SEEN
Bilirubin Urine: NEGATIVE
GLUCOSE, UA: 50 mg/dL — AB
Ketones, ur: NEGATIVE mg/dL
NITRITE: NEGATIVE
PH: 5 (ref 5.0–8.0)
Protein, ur: 100 mg/dL — AB
Specific Gravity, Urine: 1.023 (ref 1.005–1.030)

## 2016-04-12 MED ORDER — CEPHALEXIN 500 MG PO CAPS
500.0000 mg | ORAL_CAPSULE | Freq: Two times a day (BID) | ORAL | 0 refills | Status: AC
Start: 2016-04-12 — End: 2016-04-19

## 2016-04-12 NOTE — ED Triage Notes (Signed)
Patient complains of urinary frequency with no pain for several days. States she is getting up multiple times a night to urinate, NAD

## 2016-04-12 NOTE — ED Provider Notes (Signed)
[facility] DEPT Provider Note   CSN: [id number] Arrival date & time: 04/12/16  1023     History   Chief Complaint Chief Complaint  Patient presents with  . Urinary Frequency    HPI Marilyn Sexton is a 71 y.o. female with a PMHx of HTN, DM2, bipolar disorder, and hypothyroidism, who presents to the ED with complaints of increased urinary frequency and urgency since last night. Patient states that she urinated approximately 12-15 times overnight, and noticed that her urine had a foul smell. She was given Mybetriq in the past for similar symptoms, using it last night did not help. No known aggravating factors. She denies fevers, chills, CP, SOB, abd pain, N/V/D/C, hematuria, dysuria, vaginal bleeding/discharge, myalgias, arthralgias, numbness, tingling, weakness, or any other complaints at this time. Checking her sugars regularly, denies any abnormal highs.    The history is provided by the patient and medical records. No language interpreter was used.  Urinary Frequency  This is a new problem. The current episode started yesterday. The problem occurs constantly. The problem has not changed since onset.Pertinent negatives include no chest pain, no abdominal pain and no shortness of breath. Nothing aggravates the symptoms. Nothing relieves the symptoms. Treatments tried: mybetriq. The treatment provided no relief.    Past Medical History:  Diagnosis Date  . Diabetes mellitus without complication (HCC)   . Hypertension   . Thyroid disease     Patient Active Problem List   Diagnosis Date Noted  . Bipolar affective disorder, manic, moderate (HCC) 07/23/2014  . History of medication noncompliance   . Manic behavior (HCC)     Past Surgical History:  Procedure Laterality Date  . CHOLECYSTECTOMY    . coronary stents    . TUBAL LIGATION      OB History    No data available       Home Medications    Prior to Admission medications   Medication Sig Start Date End Date  Taking? Authorizing Provider  aspirin EC 81 MG tablet Take 81 mg by mouth every evening.    Historical Provider, MD  benazepril (LOTENSIN) 10 MG tablet Take 1 tablet (10 mg total) by mouth daily. 07/23/14   Charm Rings, NP  benztropine (COGENTIN) 0.5 MG tablet Take 1 tablet (0.5 mg total) by mouth 2 (two) times daily. 07/23/14   Charm Rings, NP  Canagliflozin-Metformin HCl 50-500 MG TABS Take 1 tablet by mouth daily.    Historical Provider, MD  hydrOXYzine (ATARAX/VISTARIL) 50 MG tablet Take 1 tablet (50 mg total) by mouth every 8 (eight) hours as needed for anxiety. 07/23/14   Charm Rings, NP  levothyroxine (SYNTHROID, LEVOTHROID) 100 MCG tablet Take 100 mcg by mouth daily before breakfast.    Historical Provider, MD  metFORMIN (GLUCOPHAGE) 1000 MG tablet Take 1,000 mg by mouth 2 (two) times daily.    Historical Provider, MD  metoprolol tartrate (LOPRESSOR) 25 MG tablet Take 1 tablet (25 mg total) by mouth daily. 07/23/14   Charm Rings, NP  risperiDONE (RISPERDAL) 0.5 MG tablet Take 1 tablet (0.5 mg total) by mouth at bedtime. 07/23/14   Charm Rings, NP  sertraline (ZOLOFT) 25 MG tablet Take 1 tablet (25 mg total) by mouth daily. 07/23/14   Charm Rings, NP    Family History No family history on file.  Social History Social History  Substance Use Topics  . Smoking status: Never Smoker  . Smokeless tobacco: Not on file  . Alcohol use  No     Allergies   Patient has no known allergies.   Review of Systems Review of Systems  Constitutional: Negative for chills and fever.  Respiratory: Negative for shortness of breath.   Cardiovascular: Negative for chest pain.  Gastrointestinal: Negative for abdominal pain, constipation, diarrhea, nausea and vomiting.  Genitourinary: Positive for frequency and urgency. Negative for dysuria, hematuria, vaginal bleeding and vaginal discharge.       +malodorous urine  Musculoskeletal: Negative for arthralgias and myalgias.  Skin: Negative  for color change.  Allergic/Immunologic: Positive for immunocompromised state (DM2).  Neurological: Negative for weakness and numbness.  Psychiatric/Behavioral: Negative for confusion.   10 Systems reviewed and are negative for acute change except as noted in the HPI.   Physical Exam Updated Vital Signs BP 182/77 (BP Location: Left Arm)   Pulse 104   Temp 98.8 F (37.1 C) (Oral)   Resp 20   SpO2 94%   Physical Exam  Constitutional: She is oriented to person, place, and time. Vital signs are normal. She appears well-developed and well-nourished.  Non-toxic appearance. No distress.  Afebrile, nontoxic, NAD  HENT:  Head: Normocephalic and atraumatic.  Mouth/Throat: Oropharynx is clear and moist and mucous membranes are normal.  Eyes: Conjunctivae and EOM are normal. Right eye exhibits no discharge. Left eye exhibits no discharge.  Neck: Normal range of motion. Neck supple.  Cardiovascular: Normal rate, regular rhythm, normal heart sounds and intact distal pulses.  Exam reveals no gallop and no friction rub.   No murmur heard. Pulmonary/Chest: Effort normal and breath sounds normal. No respiratory distress. She has no decreased breath sounds. She has no wheezes. She has no rhonchi. She has no rales.  Abdominal: Soft. Normal appearance and bowel sounds are normal. She exhibits no distension. There is no tenderness. There is no rigidity, no rebound, no guarding, no CVA tenderness, no tenderness at McBurney's point and negative Murphy's sign.  Soft, NTND, +BS throughout, no r/g/r, neg murphy's, neg mcburney's, no CVA TTP   Musculoskeletal: Normal range of motion.  Neurological: She is alert and oriented to person, place, and time. She has normal strength. No sensory deficit.  Skin: Skin is warm, dry and intact. No rash noted.  Psychiatric: She has a normal mood and affect.  Nursing note and vitals reviewed.    ED Treatments / Results  Labs (all labs ordered are listed, but only  abnormal results are displayed) Labs Reviewed  URINALYSIS, ROUTINE W REFLEX MICROSCOPIC - Abnormal; Notable for the following:       Result Value   Color, Urine AMBER (*)    APPearance TURBID (*)    Glucose, UA 50 (*)    Hgb urine dipstick LARGE (*)    Protein, ur 100 (*)    Leukocytes, UA LARGE (*)    Squamous Epithelial / LPF 6-30 (*)    Non Squamous Epithelial 6-30 (*)    All other components within normal limits  URINE CULTURE    EKG  EKG Interpretation None       Radiology No results found.  Procedures Procedures (including critical care time)  Medications Ordered in ED Medications - No data to display   Initial Impression / Assessment and Plan / ED Course  I have reviewed the triage vital signs and the nursing notes.  Pertinent labs & imaging results that were available during my care of the patient were reviewed by me and considered in my medical decision making (see chart for details).  71 y.o. female here with increased urinary freq and malodorous urine x1 day. Using mybetriq without improvement. Denies dysuria or hematuria. No abdominal or flank tenderness on exam. U/A with TNTC WBC and RBCs, with WBC clumps; reports no bacteria seen but I feel this is likely either due to insufficient specimen vs clumps obscuring field; will treat for UTI. Advised staying hydrated, tylenol/motrin for pain. Will send for UCx. Doubt need for other emergent work up at this time. F/up with PCP in 3-5 days for recheck. I explained the diagnosis and have given explicit precautions to return to the ER including for any other new or worsening symptoms. The patient understands and accepts the medical plan as it's been dictated and I have answered their questions. Discharge instructions concerning home care and prescriptions have been given. The patient is STABLE and is discharged to home in good condition.   Final Clinical Impressions(s) / ED Diagnoses   Final diagnoses:  Lower  urinary tract infectious disease  Urinary frequency    New Prescriptions New Prescriptions   CEPHALEXIN (KEFLEX) 500 MG CAPSULE    Take 1 capsule (500 mg total) by mouth 2 (two) times daily.     [address]Xiara Knisley, PA-C 04/12/16 1150    Cathren LaineKevin Steinl, MD 04/12/16 [phone]

## 2016-04-12 NOTE — Discharge Instructions (Signed)
Stay very well hydrated with plenty of water throughout the day. Take antibiotic until completed. Use tylenol or motrin as needed for pain. Follow up with your primary care physician in 5 days for recheck of ongoing symptoms but return to ER for emergent changing or worsening of symptoms. Please seek immediate care if you develop the following: You develop back pain.  Your symptoms are no better, or worse in 3 days. There is severe back pain or lower abdominal pain.  You develop chills.  You have a fever.  There is nausea or vomiting.  There is continued burning or discomfort with urination.

## 2016-04-14 LAB — URINE CULTURE: Culture: >=100000 — AB

## 2016-04-15 ENCOUNTER — Telehealth: Payer: Self-pay | Admitting: Emergency Medicine

## 2016-04-15 NOTE — Telephone Encounter (Signed)
Post ED Visit - Positive Culture Follow-up  Culture report reviewed by antimicrobial stewardship pharmacist:  []  Enzo BiNathan Batchelder, Pharm.D. []  Celedonio MiyamotoJeremy Frens, Pharm.D., BCPS []  Garvin FilaMike Maccia, Pharm.D. []  Georgina PillionElizabeth Martin, Pharm.D., BCPS []  ConnertonMinh Pham, [address]Pharm[address]D., BCPS, AAHIVP []  Estella HuskMichelle Turner, Pharm.D., BCPS, AAHIVP []  Tennis Mustassie Stewart, Pharm.D. []  Rob King Ranch ColonyVincent, [address]Pharm[address] Joe Arminger PharmD  Positive urine culture Treated with cephalexin, organism sensitive to the same and no further patient follow-up is required at this time.  Berle MullMiller, Yoshiharu Brassell 04/15/2016, 1:20 PM

## 2016-09-24 ENCOUNTER — Other Ambulatory Visit (HOSPITAL_COMMUNITY): Payer: Self-pay | Admitting: Endocrinology

## 2016-09-24 DIAGNOSIS — I739 Peripheral vascular disease, unspecified: Secondary | ICD-10-CM

## 2016-09-25 ENCOUNTER — Ambulatory Visit (HOSPITAL_COMMUNITY)
Admission: RE | Admit: 2016-09-25 | Discharge: 2016-09-25 | Disposition: A | Discharge status: Home / Self Care | Payer: Medicare Other | Source: Ambulatory Visit | Attending: Vascular Surgery | Admitting: Vascular Surgery

## 2016-09-25 ENCOUNTER — Other Ambulatory Visit (HOSPITAL_COMMUNITY): Payer: Self-pay | Admitting: Endocrinology

## 2016-09-25 DIAGNOSIS — I739 Peripheral vascular disease, unspecified: Secondary | ICD-10-CM

## 2016-10-17 ENCOUNTER — Emergency Department (HOSPITAL_BASED_OUTPATIENT_CLINIC_OR_DEPARTMENT_OTHER)
Admit: 2016-10-17 | Discharge: 2016-10-17 | Disposition: A | Discharge status: Home / Self Care | Payer: Medicare Other | Attending: Emergency Medicine | Admitting: Emergency Medicine

## 2016-10-17 ENCOUNTER — Emergency Department (HOSPITAL_COMMUNITY): Payer: Medicare Other

## 2016-10-17 ENCOUNTER — Encounter (HOSPITAL_COMMUNITY): Payer: Self-pay

## 2016-10-17 ENCOUNTER — Emergency Department (HOSPITAL_COMMUNITY)
Admission: EM | Admit: 2016-10-17 | Discharge: 2016-10-17 | Disposition: A | Discharge status: Home / Self Care | Payer: Medicare Other | Attending: Emergency Medicine | Admitting: Emergency Medicine

## 2016-10-17 DIAGNOSIS — Z7982 Long term (current) use of aspirin: Secondary | ICD-10-CM | POA: Diagnosis not present

## 2016-10-17 DIAGNOSIS — Z794 Long term (current) use of insulin: Secondary | ICD-10-CM | POA: Diagnosis not present

## 2016-10-17 DIAGNOSIS — Z79899 Other long term (current) drug therapy: Secondary | ICD-10-CM | POA: Insufficient documentation

## 2016-10-17 DIAGNOSIS — M7121 Synovial cyst of popliteal space [Baker], right knee: Secondary | ICD-10-CM | POA: Diagnosis not present

## 2016-10-17 DIAGNOSIS — M79609 Pain in unspecified limb: Secondary | ICD-10-CM

## 2016-10-17 DIAGNOSIS — M25561 Pain in right knee: Secondary | ICD-10-CM

## 2016-10-17 DIAGNOSIS — E119 Type 2 diabetes mellitus without complications: Secondary | ICD-10-CM | POA: Diagnosis not present

## 2016-10-17 DIAGNOSIS — I10 Essential (primary) hypertension: Secondary | ICD-10-CM | POA: Insufficient documentation

## 2016-10-17 NOTE — Progress Notes (Signed)
VASCULAR LAB PRELIMINARY  PRELIMINARY  PRELIMINARY  PRELIMINARY  Right lower extremity venous duplex completed.    Preliminary report:  There is no DVT or SVT noted in the right lower extremity.  There is a small partially rupturing Baker's cyst noted in the right popliteal fossa.   Called report to Dr. Orson EvaJacubowitz  Marilyn Sexton, Spectrum Health Butterworth CampusCANDACE, RVT 10/17/2016, 3:42 PM

## 2016-10-17 NOTE — ED Notes (Signed)
Patient able to ambulate independently  

## 2016-10-17 NOTE — ED Provider Notes (Signed)
[facility] Provider Note   CSN: [id number] Arrival date & time: 10/17/16  1120     History   Chief Complaint Chief Complaint  Patient presents with  . Knee Pain    HPI Marilyn Sexton is a 71 y.o. female.Plains of pain behind her right knee flexor crease of knee for the past 2 days she first noticed it when getting out of a car yesterday. Also complains of mild right calf pain. Pain is worse with weightbearing and improved with nonweightbearing. No other complaint. Treated with Tylenol. She feels improved today over yesterday. No other associated symptoms  HPI  Past Medical History:  Diagnosis Date  . Diabetes mellitus without complication (HCC)   . Hypertension   . Thyroid disease     Patient Active Problem List   Diagnosis Date Noted  . Bipolar affective disorder, manic, moderate (HCC) 07/23/2014  . History of medication noncompliance   . Manic behavior (HCC)     Past Surgical History:  Procedure Laterality Date  . CHOLECYSTECTOMY    . coronary stents    . TUBAL LIGATION      OB History    No data available       Home Medications    Prior to Admission medications   Medication Sig Start Date End Date Taking? Authorizing Provider  amLODipine (NORVASC) 5 MG tablet Take 5 mg by mouth daily.   Yes [provider]  aspirin EC 81 MG tablet Take 81 mg by mouth every evening.   Yes [provider]  benazepril (LOTENSIN) 10 MG tablet Take 1 tablet (10 mg total) by mouth daily. Patient taking differently: Take 20 mg by mouth 2 (two) times daily.  07/23/14  Yes Charm Rings, NP  benztropine (COGENTIN) 0.5 MG tablet Take 1 tablet (0.5 mg total) by mouth 2 (two) times daily. 07/23/14  Yes Charm Rings, NP  ibuprofen (ADVIL,MOTRIN) 200 MG tablet Take 400 mg by mouth every 6 (six) hours as needed for mild pain.   Yes [provider]  insulin lispro (HUMALOG) 100 UNIT/ML injection Inject 12-14 Units into the skin 2 (two) times  daily. Inject 12 units SQ in the morning and inject 14 units SQ at night   Yes [provider]  metFORMIN (GLUCOPHAGE) 1000 MG tablet Take 1,000 mg by mouth 2 (two) times daily.   Yes [provider]  metoprolol tartrate (LOPRESSOR) 25 MG tablet Take 1 tablet (25 mg total) by mouth daily. Patient taking differently: Take 50 mg by mouth 2 (two) times daily.  07/23/14  Yes Charm Rings, NP  risperiDONE (RISPERDAL) 0.5 MG tablet Take 1 tablet (0.5 mg total) by mouth at bedtime. 07/23/14  Yes Charm Rings, NP  hydrOXYzine (ATARAX/VISTARIL) 50 MG tablet Take 1 tablet (50 mg total) by mouth every 8 (eight) hours as needed for anxiety. Patient not taking: Reported on 10/17/2016 07/23/14   Charm Rings, NP  sertraline (ZOLOFT) 25 MG tablet Take 1 tablet (25 mg total) by mouth daily. Patient not taking: Reported on 10/17/2016 07/23/14   Charm Rings, NP    Family History History reviewed. No pertinent family history.  Social History Social History  Substance Use Topics  . Smoking status: Never Smoker  . Smokeless tobacco: Never Used  . Alcohol use No   Ex-smoker no drug use  Allergies   Patient has no known allergies.   Review of Systems Review of Systems  Constitutional: Negative.   HENT: Negative.  Respiratory: Negative.   Cardiovascular: Negative.   Gastrointestinal: Negative.   Musculoskeletal: Positive for arthralgias.       Right Knee pain, right calf pain  Skin: Negative.   Allergic/Immunologic: Positive for immunocompromised state.       Diabetic  Neurological: Negative.   Psychiatric/Behavioral: Negative.   All other systems reviewed and are negative.    Physical Exam Updated Vital Signs BP 139/65 (BP Location: Left Arm)   Pulse 77   Temp 97.6 F (36.4 C) (Oral)   Resp 18   SpO2 96%   Physical Exam  Constitutional: She appears well-developed and well-nourished.  HENT:  Head: Normocephalic and atraumatic.  Eyes: Pupils are equal,  round, and reactive to light. Conjunctivae are normal.  Neck: Neck supple. No tracheal deviation present. No thyromegaly present.  Cardiovascular: Normal rate and regular rhythm.   No murmur heard. Pulmonary/Chest: Effort normal and breath sounds normal.  Abdominal: Soft. Bowel sounds are normal. She exhibits no distension. There is no tenderness.  Obese  Musculoskeletal: Normal range of motion. She exhibits no edema or tenderness.  Right lower extremity without redness. DP pulse 2+ good capillary refill. She is tender at proximal calf and slightly at flexor crease of knee. No  swelling of knee. Or knee effusion. All other extremities without redness swelling or tenderness neurovascularly intact  Neurological: She is alert. Coordination normal.  Skin: Skin is warm and dry. Capillary refill takes less than 2 seconds. No rash noted.  Psychiatric: She has a normal mood and affect.  Nursing note and vitals reviewed.    ED Treatments / Results  Labs (all labs ordered are listed, but only abnormal results are displayed) Labs Reviewed - No data to display  EKG  EKG Interpretation None       Radiology No results found.  Procedures Procedures (including critical care time) Venous Doppler of right lower extremity consistent with Baker's cyst, ruptured partially Medications Ordered in ED Medications - No data to display  X-ray viewed by me Results for orders placed or performed during the hospital encounter of 04/12/16  Urine culture  Result Value Ref Range   Specimen Description URINE, CLEAN CATCH    Special Requests NONE    Culture >=100,000 COLONIES/mL ESCHERICHIA COLI (A)    Report Status 04/14/2016 FINAL    Organism ID, Bacteria ESCHERICHIA COLI (A)       Susceptibility   Escherichia coli - MIC*    AMPICILLIN >=32 RESISTANT Resistant     CEFAZOLIN 16 SENSITIVE Sensitive     CEFTRIAXONE <=1 SENSITIVE Sensitive     CIPROFLOXACIN <=0.25 SENSITIVE Sensitive     GENTAMICIN  <=1 SENSITIVE Sensitive     IMIPENEM <=0.25 SENSITIVE Sensitive     NITROFURANTOIN <=16 SENSITIVE Sensitive     TRIMETH/SULFA <=20 SENSITIVE Sensitive     AMPICILLIN/SULBACTAM >=32 RESISTANT Resistant     PIP/TAZO <=4 SENSITIVE Sensitive     Extended ESBL NEGATIVE Sensitive     * >=100,000 COLONIES/mL ESCHERICHIA COLI  Urinalysis, Routine w reflex microscopic  Result Value Ref Range   Color, Urine AMBER (A) YELLOW   APPearance TURBID (A) CLEAR   Specific Gravity, Urine 1.023 1.005 - 1.030   pH 5.0 5.0 - 8.0   Glucose, UA 50 (A) NEGATIVE mg/dL   Hgb urine dipstick LARGE (A) NEGATIVE   Bilirubin Urine NEGATIVE NEGATIVE   Ketones, ur NEGATIVE NEGATIVE mg/dL   Protein, ur 161 (A) NEGATIVE mg/dL   Nitrite NEGATIVE NEGATIVE   Leukocytes, UA LARGE (  A) NEGATIVE   RBC / HPF TOO NUMEROUS TO COUNT 0 - 5 RBC/hpf   WBC, UA TOO NUMEROUS TO COUNT 0 - 5 WBC/hpf   Bacteria, UA NONE SEEN NONE SEEN   Squamous Epithelial / LPF 6-30 (A) NONE SEEN   WBC Clumps PRESENT    Non Squamous Epithelial 6-30 (A) NONE SEEN   Dg Knee Complete 4 Views Right  Result Date: 10/17/2016 CLINICAL DATA:  Right knee pain. EXAM: RIGHT KNEE - COMPLETE 4+ VIEW COMPARISON:  None. FINDINGS: No fracture or dislocation. 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 There is minimal spurring the tibial spines. Minimal enthesopathic change involving the superior pole of the patella. No evidence of chondrocalcinosis. Suspected small knee joint effusion. Regional soft tissues appear normal. IMPRESSION: 1. Small knee joint effusion, otherwise, no acute findings. 2. 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 Electronically Signed   By: Simonne ComeJohn  Watts M.D.   On: 10/17/2016 14:23   Initial Impression / Assessment and Plan / ED Course  I have reviewed the triage vital signs and the nursing  notes.  Pertinent labs & imaging results that were available during my care of the patient were reviewed by me and considered in my medical decision making (see chart for details).    4:20 PM patient reports that her pain continues to improve without time. She ambulates without difficulty.  Clinically pain consistent with Baker's cyst, ruptured and improving. Plan Tylenol as needed for pain. Follow-up with PMD as needed. Blood pressure recheck one week Final Clinical Impressions(s) / ED Diagnoses  Diagnosis #1right knee pain #2 elevated blood pressure Final diagnoses:  None    New Prescriptions New Prescriptions   No medications on file     Doug SouJacubowitz, Alle Difabio, MD 10/17/16 1626

## 2016-10-17 NOTE — ED Triage Notes (Signed)
Onset last night pain to posterior knee.  Unable to bear full weight on right leg.   Pt reports she was tested several weeks ago in bilateral calves.  No shortness of breath, chest pain.

## 2016-10-17 NOTE — Discharge Instructions (Signed)
Take Tylenol as directed for pain if needed. Get your blood pressure recheck within the next one week at your primary care physician's office .today's was elevated at 162/68. See your primary care physician if your knee does not continue to improve

## 2016-10-21 ENCOUNTER — Encounter (INDEPENDENT_AMBULATORY_CARE_PROVIDER_SITE_OTHER): Payer: Self-pay | Admitting: Orthopedic Surgery

## 2016-10-21 ENCOUNTER — Ambulatory Visit (INDEPENDENT_AMBULATORY_CARE_PROVIDER_SITE_OTHER): Payer: Medicare Other | Admitting: Orthopedic Surgery

## 2016-10-21 DIAGNOSIS — M1711 Unilateral primary osteoarthritis, right knee: Secondary | ICD-10-CM | POA: Diagnosis not present

## 2016-10-21 MED ORDER — LIDOCAINE HCL 1 % IJ SOLN
5.0000 mL | INTRAMUSCULAR | Status: AC | PRN
  Administered 2016-10-21: 5 mL

## 2016-10-21 MED ORDER — METHYLPREDNISOLONE ACETATE 40 MG/ML IJ SUSP
40.0000 mg | INTRAMUSCULAR | Status: AC | PRN
  Administered 2016-10-21: 40 mg via INTRA_ARTICULAR

## 2016-10-21 NOTE — Progress Notes (Signed)
Office Visit Note   Patient: Marilyn Sexton           Date of Birth: [birth year]           MRN: [id number] Visit Date: 10/21/2016              Requested by: Rinaldo Cloud, MD [phone] [address] [place], [place] [address] PCP: Rinaldo Cloud, MD  Chief Complaint  Patient presents with  . Right Knee - Pain, Follow-up      HPI: Patient is a 71 year old woman who was initially seen in emergency room radiographs were obtained and reviewed and shows medial joint line narrowing with minimal osteophytic bone spurs in all 3 compartments. Patient complains of pain with weightbearing pain in the popliteal fossa complains of swelling.  Assessment & Plan: Visit Diagnoses:  1. Unilateral primary osteoarthritis, right knee     Plan: Right knee was aspirated and injected. Plan to follow-up in 4 weeks.  Follow-Up Instructions: Return in about 4 weeks (around 11/18/2016).   Ortho Exam  Patient is alert, oriented, no adenopathy, well-dressed, normal affect, normal respiratory effort. Examination patient has an antalgic gait. She has a moderate effusion of the right knee. She is tender to palpation medial and lateral joint line class and cruciate are stable. She is tender to palpation in the popliteal fossa. She has no calf tenderness no clinical signs of DVT. Radiograph is reviewed which shows medial joint line narrowing.  Imaging: No results found. No images are attached to the encounter.  Labs: Lab Results  Component Value Date   HGBA1C (H) 06/07/2007    6.8 (NOTE)   The ADA recommends the following therapeutic goals for glycemic   control related to Hgb A1C measurement:   Goal of Therapy:   < 7.0% Hgb A1C   Action Suggested:  > 8.0% Hgb A1C   Ref:  Diabetes Care, 22, Suppl. 1, 1999   REPTSTATUS 04/14/2016 FINAL 04/12/2016   CULT >=100,000 COLONIES/mL ESCHERICHIA COLI (A) 04/12/2016   LABORGA ESCHERICHIA COLI (A) 04/12/2016    Orders:  Orders Placed This Encounter    Procedures  . Large Joint Injection/Arthrocentesis   No orders of the defined types were placed in this encounter.    Procedures: Large Joint Inj Date/Time: 10/21/2016 2:59 PM Performed by: DUDA, MARCUS V Authorized by: Nadara Mustard   Consent Given by:  Patient Site marked: the procedure site was marked   Timeout: prior to procedure the correct patient, procedure, and site was verified   Indications:  Pain and diagnostic evaluation Location:  Knee Site:  R knee Prep: patient was prepped and draped in usual sterile fashion   Needle Size:  22 G Needle Length:  1.5 inches Approach:  Superolateral Ultrasound Guidance: No   Fluoroscopic Guidance: No   Arthrogram: No   Medications:  5 mL lidocaine 1 %; 40 mg methylPREDNISolone acetate 40 MG/ML Aspiration Attempted: No   Aspirate amount (mL):  20 Aspirate:  Clear Patient tolerance:  Patient tolerated the procedure well with no immediate complications    Clinical Data: No additional findings.  ROS:  All other systems negative, except as noted in the HPI. Review of Systems  Objective: Vital Signs: There were no vitals taken for this visit.  Specialty Comments:  No specialty comments available.  PMFS History: Patient Active Problem List   Diagnosis Date Noted  . Bipolar affective disorder, manic, moderate (HCC) 07/23/2014  . History of medication noncompliance   . Manic behavior (  HCC)    Past Medical History:  Diagnosis Date  . Diabetes mellitus without complication (HCC)   . Hypertension   . Thyroid disease     No family history on file.  Past Surgical History:  Procedure Laterality Date  . CHOLECYSTECTOMY    . coronary stents    . TUBAL LIGATION     Social History   Occupational History  . Not on file.   Social History Main Topics  . Smoking status: Never Smoker  . Smokeless tobacco: Never Used  . Alcohol use No  . Drug use: No  . Sexual activity: Not on file

## 2016-11-04 ENCOUNTER — Ambulatory Visit (INDEPENDENT_AMBULATORY_CARE_PROVIDER_SITE_OTHER): Payer: Medicare Other | Admitting: Family

## 2016-11-04 ENCOUNTER — Encounter (INDEPENDENT_AMBULATORY_CARE_PROVIDER_SITE_OTHER): Payer: Self-pay | Admitting: Family

## 2016-11-04 DIAGNOSIS — M25461 Effusion, right knee: Secondary | ICD-10-CM | POA: Diagnosis not present

## 2016-11-04 DIAGNOSIS — M1711 Unilateral primary osteoarthritis, right knee: Secondary | ICD-10-CM

## 2016-11-04 MED ORDER — LIDOCAINE HCL 1 % IJ SOLN
1.0000 mL | INTRAMUSCULAR | Status: AC | PRN
  Administered 2016-11-04: 1 mL

## 2016-11-04 NOTE — Progress Notes (Signed)
Office Visit Note   Patient: Marilyn Sexton           Date of Birth: 1945-06-29           MRN: [id number] Visit Date: 11/04/2016              Requested by: Rinaldo Cloud, MD [phone] [address] [place], [place] [address] PCP: Rinaldo Cloud, MD  Chief Complaint  Patient presents with  . Right Knee - Pain      HPI: Patient is a 71 year old woman who is seen in follow up for swelling, pain and reduced flexion right knee. Had 20 cc of fluid   Assessment & Plan: Visit Diagnoses:  1. Effusion, right knee   2. Primary osteoarthritis of right knee     Plan: Right knee was aspirated. Recommended ice and compression. Plan to follow-up in 4 weeks.  Follow-Up Instructions: Return in about 4 weeks (around 12/02/2016), or if symptoms worsen or fail to improve.   Right Knee Exam   Tenderness  The patient is experiencing no tenderness.     Range of Motion  Flexion: abnormal   Muscle Strength   The patient has normal right knee strength.  Tests  Varus: negative Valgus: negative  Other  Erythema: absent Swelling: moderate Other tests: effusion present      Patient is alert, oriented, no adenopathy, well-dressed, normal affect, normal respiratory effort. Examination patient has an antalgic gait. She has a moderate effusion of the right knee. She is tender to palpation medial and lateral joint line class and cruciate are stable. She is tender to palpation in the popliteal fossa.   Imaging: No results found. No images are attached to the encounter.  Labs: Lab Results  Component Value Date   HGBA1C (H) 06/07/2007    6.8 (NOTE)   The ADA recommends the following therapeutic goals for glycemic   control related to Hgb A1C measurement:   Goal of Therapy:   < 7.0% Hgb A1C   Action Suggested:  > 8.0% Hgb A1C   Ref:  Diabetes Care, 22, Suppl. 1, 1999   REPTSTATUS 04/14/2016 FINAL 04/12/2016   CULT >=100,000 COLONIES/mL ESCHERICHIA COLI (A) 04/12/2016   LABORGA ESCHERICHIA COLI (A) 04/12/2016    Orders:  No orders of the defined types were placed in this encounter.  No orders of the defined types were placed in this encounter.    Procedures: Large Joint Inj Date/Time: 11/04/2016 9:47 AM Performed by: Adonis Huguenin Authorized by: Barnie Del R   Consent Given by:  Patient Site marked: the procedure site was marked   Timeout: prior to procedure the correct patient, procedure, and site was verified   Indications:  Pain and diagnostic evaluation Location:  Knee Site:  R knee Needle Size:  22 G Needle Length:  1.5 inches Approach:  Superolateral Ultrasound Guidance: No   Fluoroscopic Guidance: No   Arthrogram: No   Medications:  1 mL lidocaine 1 % Aspiration Attempted: No   Aspirate amount (mL):  30 Aspirate:  Serous and yellow Patient tolerance:  Patient tolerated the procedure well with no immediate complications    Clinical Data: No additional findings.  ROS:  All other systems negative, except as noted in the HPI. Review of Systems  Constitutional: Negative for chills and fever.  Musculoskeletal: Positive for arthralgias and joint swelling.    Objective: Vital Signs: There were no vitals taken for this visit.  Specialty Comments:  No specialty comments available.  PMFS  History: Patient Active Problem List   Diagnosis Date Noted  . Bipolar affective disorder, manic, moderate (HCC) 07/23/2014  . History of medication noncompliance   . Manic behavior (HCC)    Past Medical History:  Diagnosis Date  . Diabetes mellitus without complication (HCC)   . Hypertension   . Thyroid disease     No family history on file.  Past Surgical History:  Procedure Laterality Date  . CHOLECYSTECTOMY    . coronary stents    . TUBAL LIGATION     Social History   Occupational History  . Not on file.   Social History Main Topics  . Smoking status: Never Smoker  . Smokeless tobacco: Never Used  . Alcohol use No  .  Drug use: No  . Sexual activity: Not on file

## 2016-11-12 ENCOUNTER — Telehealth (INDEPENDENT_AMBULATORY_CARE_PROVIDER_SITE_OTHER): Payer: Self-pay | Admitting: Orthopedic Surgery

## 2016-11-12 NOTE — Telephone Encounter (Signed)
Patient called asked for a call back concerning the fluid in her right knee. Patient said her knee is swollen pretty big and [facility] Cornersuda before she make an appointment. The number to contact patient is [phone]

## 2016-11-13 ENCOUNTER — Encounter (INDEPENDENT_AMBULATORY_CARE_PROVIDER_SITE_OTHER): Payer: Self-pay | Admitting: Family

## 2016-11-13 ENCOUNTER — Telehealth (INDEPENDENT_AMBULATORY_CARE_PROVIDER_SITE_OTHER): Payer: Self-pay | Admitting: Family

## 2016-11-13 ENCOUNTER — Ambulatory Visit (INDEPENDENT_AMBULATORY_CARE_PROVIDER_SITE_OTHER): Payer: Medicare Other | Admitting: Family

## 2016-11-13 DIAGNOSIS — M2351 Chronic instability of knee, right knee: Secondary | ICD-10-CM

## 2016-11-13 DIAGNOSIS — M25461 Effusion, right knee: Secondary | ICD-10-CM

## 2016-11-13 DIAGNOSIS — M25561 Pain in right knee: Secondary | ICD-10-CM

## 2016-11-13 DIAGNOSIS — M1711 Unilateral primary osteoarthritis, right knee: Secondary | ICD-10-CM

## 2016-11-13 MED ORDER — LIDOCAINE HCL 1 % IJ SOLN
5.0000 mL | INTRAMUSCULAR | Status: AC | PRN
  Administered 2016-11-13: 5 mL

## 2016-11-13 MED ORDER — METHYLPREDNISOLONE ACETATE 40 MG/ML IJ SUSP
40.0000 mg | INTRAMUSCULAR | Status: AC | PRN
  Administered 2016-11-13: 40 mg via INTRA_ARTICULAR

## 2016-11-13 NOTE — Progress Notes (Signed)
Office Visit Note   Patient: Marilyn Sexton           Date of Birth: 1945-03-16           MRN: [id number] Visit Date: 11/13/2016              Requested by: Rinaldo CloudHarwani, Mohan, MD [phone] [address] [place], [place] [id number] PCP: Rinaldo CloudHarwani, Mohan, MD  Chief Complaint  Patient presents with  . Right Knee - Follow-up      HPI: Patient is a 71 year old woman who is seen in follow up for swelling, pain and reduced flexion right knee. Has had 2 arthrocentesis for same. She stepped out of a truck a little over a month ago. Has had recurrent effusions.   Assessment & Plan: Visit Diagnoses:  1. Effusion, right knee     Plan: Right knee was aspirated. Recommended ice and compression. Plan to proceed with MRI if no improvement in next 4 weeks. Patient to call if would like to proceed with MRI. Will need pre meds for claustrophobia.  Follow-Up Instructions: Return in about 4 weeks (around 12/11/2016), or if symptoms worsen or fail to improve.   Right Knee Exam   Tenderness  The patient is experiencing no tenderness.     Range of Motion  Flexion: abnormal   Muscle Strength   The patient has normal right knee strength.  Tests  Varus: negative Valgus: negative  Other  Erythema: absent Swelling: moderate Other tests: effusion present      Patient is alert, oriented, no adenopathy, well-dressed, normal affect, normal respiratory effort.   Imaging: No results found. No images are attached to the encounter.  Labs: Lab Results  Component Value Date   HGBA1C (H) 06/07/2007    6.8 (NOTE)   The ADA recommends the following therapeutic goals for glycemic   control related to Hgb A1C measurement:   Goal of Therapy:   < 7.0% Hgb A1C   Action Suggested:  > 8.0% Hgb A1C   Ref:  Diabetes Care, 22, Suppl. 1, 1999   REPTSTATUS 04/14/2016 FINAL 04/12/2016   CULT >=100,000 COLONIES/mL ESCHERICHIA COLI (A) 04/12/2016   LABORGA ESCHERICHIA COLI (A) 04/12/2016     Orders:  No orders of the defined types were placed in this encounter.  No orders of the defined types were placed in this encounter.    Procedures: Large Joint Inj Date/Time: 11/13/2016 1:58 PM Performed by: Adonis HugueninZAMORA, [place] Beckford R Authorized by: Barnie DelZAMORA, Mannat Benedetti R   Consent Given by:  Patient Site marked: the procedure site was marked   Timeout: prior to procedure the correct patient, procedure, and site was verified   Indications:  Pain and diagnostic evaluation Location:  Knee Site:  R knee Needle Size:  22 G Needle Length:  1.5 inches Approach:  Superolateral Ultrasound Guidance: No   Fluoroscopic Guidance: No   Arthrogram: No   Medications:  5 mL lidocaine 1 %; 40 mg methylPREDNISolone acetate 40 MG/ML Aspiration Attempted: No   Aspirate amount (mL):  30 Aspirate:  Blood-tinged and serous Patient tolerance:  Patient tolerated the procedure well with no immediate complications    Clinical Data: No additional findings.  ROS:  All other systems negative, except as noted in the HPI. Review of Systems  Constitutional: Negative for chills and fever.  Musculoskeletal: Positive for arthralgias and joint swelling.    Objective: Vital Signs: There were no vitals taken for this visit.  Specialty Comments:  No specialty comments available.  PMFS History:  Patient Active Problem List   Diagnosis Date Noted  . Bipolar affective disorder, manic, moderate (HCC) 07/23/2014  . History of medication noncompliance   . Manic behavior (HCC)    Past Medical History:  Diagnosis Date  . Diabetes mellitus without complication (HCC)   . Hypertension   . Thyroid disease     History reviewed. No pertinent family history.  Past Surgical History:  Procedure Laterality Date  . CHOLECYSTECTOMY    . coronary stents    . TUBAL LIGATION     Social History   Occupational History  . Not on file.   Social History Main Topics  . Smoking status: Never Smoker  . Smokeless tobacco: Never  Used  . Alcohol use No  . Drug use: No  . Sexual activity: Not on file

## 2016-11-13 NOTE — Telephone Encounter (Signed)
I called and spoke with patient over the phone advised her that she could come in and another aspiration can be attempted in office to hopefully give her some relief. Advised that MRI will also be ordered to evaluate further pathology to find out why she is having reoccurring effusion.

## 2016-11-13 NOTE — Telephone Encounter (Signed)
Patient called back concerning the fluid in her knee. Patient asked if she need to be seen to have fluid drawn off. The number to contact patient is [phone]

## 2016-11-18 ENCOUNTER — Ambulatory Visit (INDEPENDENT_AMBULATORY_CARE_PROVIDER_SITE_OTHER): Payer: Medicare Other | Admitting: Orthopedic Surgery

## 2016-11-24 ENCOUNTER — Ambulatory Visit (HOSPITAL_COMMUNITY)
Admission: RE | Admit: 2016-11-24 | Discharge: 2016-11-24 | Disposition: A | Discharge status: Home / Self Care | Payer: Medicare Other | Source: Ambulatory Visit | Attending: Orthopedic Surgery | Admitting: Orthopedic Surgery

## 2016-11-24 DIAGNOSIS — M7121 Synovial cyst of popliteal space [Baker], right knee: Secondary | ICD-10-CM | POA: Diagnosis not present

## 2016-11-24 DIAGNOSIS — M1712 Unilateral primary osteoarthritis, left knee: Secondary | ICD-10-CM | POA: Insufficient documentation

## 2016-11-24 DIAGNOSIS — M23251 Derangement of posterior horn of lateral meniscus due to old tear or injury, right knee: Secondary | ICD-10-CM | POA: Diagnosis not present

## 2016-11-24 DIAGNOSIS — M25461 Effusion, right knee: Secondary | ICD-10-CM | POA: Diagnosis present

## 2016-11-24 DIAGNOSIS — M2351 Chronic instability of knee, right knee: Secondary | ICD-10-CM

## 2016-11-24 DIAGNOSIS — M1711 Unilateral primary osteoarthritis, right knee: Secondary | ICD-10-CM

## 2016-11-24 DIAGNOSIS — M25561 Pain in right knee: Secondary | ICD-10-CM

## 2016-11-26 ENCOUNTER — Ambulatory Visit (INDEPENDENT_AMBULATORY_CARE_PROVIDER_SITE_OTHER): Payer: Medicare Other | Admitting: Orthopedic Surgery

## 2016-11-26 ENCOUNTER — Encounter (INDEPENDENT_AMBULATORY_CARE_PROVIDER_SITE_OTHER): Payer: Self-pay | Admitting: Orthopedic Surgery

## 2016-11-26 DIAGNOSIS — M1711 Unilateral primary osteoarthritis, right knee: Secondary | ICD-10-CM | POA: Diagnosis not present

## 2016-11-26 DIAGNOSIS — M25461 Effusion, right knee: Secondary | ICD-10-CM

## 2016-11-26 DIAGNOSIS — M23261 Derangement of other lateral meniscus due to old tear or injury, right knee: Secondary | ICD-10-CM | POA: Diagnosis not present

## 2016-11-26 NOTE — Progress Notes (Signed)
Office Visit Note   Patient: Marilyn Sexton           Date of Birth: [birth year]           MRN: [id number] Visit Date: 11/26/2016              Requested by: Rinaldo Cloud, MD [phone] [address] [place], [place] [address] PCP: Rinaldo Cloud, MD  Chief Complaint  Patient presents with  . Right Knee - Follow-up  . Results    MRI Rt Knee      HPI: Patient is a 71 year old woman with mechanical symptoms of her right knee she has catching locking giving way and swelling. Patient is concerned that she may need to have her knee aspirated again. She is status post her MRI scan.  Assessment & Plan: Visit Diagnoses:  1. Effusion, right knee   2. Unilateral primary osteoarthritis, right knee   3. 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 Patient states she would like to proceed with arthroscopy risk and benefits of surgery were discussed discussed that patient may have approximately 50% improvement in her symptoms. She would still have arthritic pain.  Follow-Up Instructions: Return in about 2 weeks (around 12/10/2016).   Ortho Exam  Patient is alert, oriented, no adenopathy, well-dressed, normal affect, normal respiratory effort. Examination patient has difficulty getting from a sitting to a standing position she is nicely tender to palpation of the lateral joint line collapse and cruciate are stable she has a mild effusion at this time. Review of the MRI scan showed a radial tear of the lateral meniscus as well as tricompartmental arthritic changes with a Baker's cyst and loose body.  Imaging: No results found. No images are attached to the encounter.  Labs: Lab Results  Component Value Date   HGBA1C (H) 06/07/2007    6.8 (NOTE)   The ADA recommends the following therapeutic goals for glycemic   control related to Hgb A1C measurement:   Goal of  Therapy:   < 7.0% Hgb A1C   Action Suggested:  > 8.0% Hgb A1C   Ref:  Diabetes Care, 22, Suppl. 1, 1999   REPTSTATUS 04/14/2016 FINAL 04/12/2016   CULT >=100,000 COLONIES/mL ESCHERICHIA COLI (A) 04/12/2016   LABORGA ESCHERICHIA COLI (A) 04/12/2016    Orders:  No orders of the defined types were placed in this encounter.  No orders of the defined types were placed in this encounter.    Procedures: No procedures performed  Clinical Data: No additional findings.  ROS:  All other systems negative, except as noted in the HPI. Review of Systems  Objective: Vital Signs: There were no vitals taken for this visit.  Specialty Comments:  No specialty comments available.  PMFS History: Patient Active Problem List   Diagnosis Date Noted  . Bipolar affective disorder, manic, moderate (HCC) 07/23/2014  . History of medication noncompliance   . Manic behavior (HCC)    Past Medical History:  Diagnosis Date  . Diabetes mellitus without complication (HCC)   . Hypertension   . Thyroid disease     No family history on file.  Past Surgical History:  Procedure Laterality Date  . CHOLECYSTECTOMY    . coronary stents    . TUBAL LIGATION     Social History   Occupational History  . Not on file.   Social History Main Topics  . Smoking  status: Never Smoker  . Smokeless tobacco: Never Used  . Alcohol use No  . Drug use: No  . Sexual activity: Not on file

## 2016-12-07 ENCOUNTER — Telehealth (INDEPENDENT_AMBULATORY_CARE_PROVIDER_SITE_OTHER): Payer: Self-pay | Admitting: Orthopedic Surgery

## 2016-12-07 NOTE — Telephone Encounter (Signed)
Please give pt a call (MRI)

## 2016-12-08 NOTE — Telephone Encounter (Signed)
I called and scheduled Marilyn Sexton's surgery for Wed 12/16/2016 at [facility] OR.  All surgery info given.

## 2016-12-09 ENCOUNTER — Other Ambulatory Visit (INDEPENDENT_AMBULATORY_CARE_PROVIDER_SITE_OTHER): Payer: Self-pay | Admitting: Family

## 2016-12-10 ENCOUNTER — Encounter (HOSPITAL_COMMUNITY): Payer: Self-pay

## 2016-12-10 ENCOUNTER — Encounter (HOSPITAL_COMMUNITY)
Admission: RE | Admit: 2016-12-10 | Discharge: 2016-12-10 | Disposition: A | Discharge status: Home / Self Care | Payer: Medicare Other | Source: Ambulatory Visit | Attending: Orthopedic Surgery | Admitting: Orthopedic Surgery

## 2016-12-10 DIAGNOSIS — Z01810 Encounter for preprocedural cardiovascular examination: Secondary | ICD-10-CM | POA: Insufficient documentation

## 2016-12-10 DIAGNOSIS — Z01812 Encounter for preprocedural laboratory examination: Secondary | ICD-10-CM | POA: Insufficient documentation

## 2016-12-10 DIAGNOSIS — E119 Type 2 diabetes mellitus without complications: Secondary | ICD-10-CM | POA: Diagnosis not present

## 2016-12-10 HISTORY — DX: Dyspnea, unspecified: R06.00

## 2016-12-10 HISTORY — DX: Acute myocardial infarction, unspecified: I21.9

## 2016-12-10 LAB — CBC
HEMATOCRIT: 36.1 % (ref 36.0–46.0)
HEMOGLOBIN: 12 g/dL (ref 12.0–15.0)
MCH: 33.3 pg (ref 26.0–34.0)
MCHC: 33.2 g/dL (ref 30.0–36.0)
MCV: 100.3 fL — ABNORMAL HIGH (ref 78.0–100.0)
Platelets: 297 10*3/uL (ref 150–400)
RBC: 3.6 MIL/uL — ABNORMAL LOW (ref 3.87–5.11)
RDW: 15.6 % — ABNORMAL HIGH (ref 11.5–15.5)
WBC: 9.2 10*3/uL (ref 4.0–10.5)

## 2016-12-10 LAB — BASIC METABOLIC PANEL
ANION GAP: 9 (ref 5–15)
BUN: 13 mg/dL (ref 6–20)
CO2: 27 mmol/L (ref 22–32)
Calcium: 9.4 mg/dL (ref 8.9–10.3)
Chloride: 101 mmol/L (ref 101–111)
Creatinine, Ser: 1.09 mg/dL — ABNORMAL HIGH (ref 0.44–1.00)
GFR calc Af Amer: 59 mL/min — ABNORMAL LOW (ref >60)
GFR, EST NON AFRICAN AMERICAN: 51 mL/min — AB (ref >60)
GLUCOSE: 136 mg/dL — AB (ref 65–99)
POTASSIUM: 4.6 mmol/L (ref 3.5–5.1)
Sodium: 137 mmol/L (ref 135–145)

## 2016-12-10 LAB — NO BLOOD PRODUCTS

## 2016-12-10 LAB — HEMOGLOBIN A1C
Hgb A1c MFr Bld: 5.5 % (ref 4.8–5.6)
MEAN PLASMA GLUCOSE: 111.15 mg/dL

## 2016-12-10 LAB — GLUCOSE, CAPILLARY: Glucose-Capillary: 165 mg/dL — ABNORMAL HIGH (ref 65–99)

## 2016-12-10 NOTE — Pre-Procedure Instructions (Addendum)
Marilyn Sexton  12/10/2016      [facility] - [place] - [address] [name] [address] Phone: [phone] Fax: [phone]    Your procedure is scheduled on December 16, 2016.  Report to [facility] Admitting at 06:30 A.M.  Call this number if you have problems the morning of surgery:  [phone]   Remember:  Do not eat food or drink liquids after midnight.  Take these medicines the morning of surgery with A SIP OF WATER :  Amlodipine (Norvasc) Benztropine (Cogentin) Levothyroxine (Synthroid) Metoprolol (Lopressor) Mirabegron ER (Myrbetriq) Eye drops if needed  7 days prior to surgery STOP taking any Aspirin (unless otherwise instructed by your surgeon), Aleve, Naproxen, Ibuprofen, Motrin, Advil, Goody's, BC's, all herbal medications, fish oil, and all vitamins  How to Manage Your Diabetes Before and After Surgery  Why is it important to control my blood sugar before and after surgery? . Improving blood sugar levels before and after surgery helps healing and can limit problems. . A way of improving blood sugar control is eating a healthy diet by: o  Eating less sugar and carbohydrates o  Increasing activity/exercise o  Talking with your doctor about reaching your blood sugar goals . High blood sugars (greater than 180 mg/dL) can raise your risk of infections and slow your recovery, so you will need to focus on controlling your diabetes during the weeks before surgery. . Make sure that the doctor who takes care of your diabetes knows about your planned surgery including the date and location.  How do I manage my blood sugar before surgery? . Check your blood sugar at least 4 times a day, starting 2 days before surgery, to make sure that the level is not too high or low. o Check your blood sugar the morning of your surgery when you wake up and every 2 hours until you get to the Short  Stay unit. . If your blood sugar is less than 70 mg/dL, you will need to treat for low blood sugar: o Do not take insulin. o Treat a low blood sugar (less than 70 mg/dL) with  cup of clear juice (cranberry or apple), 4 glucose tablets, OR glucose gel. o Recheck blood sugar in 15 minutes after treatment (to make sure it is greater than 70 mg/dL). If your blood sugar is not greater than 70 mg/dL on recheck, call [phone] for further instructions. . Report your blood sugar to the short stay nurse when you get to Short Stay.  . If you are admitted to the hospital after surgery: o Your blood sugar will be checked by the staff and you will probably be given insulin after surgery (instead of oral diabetes medicines) to make sure you have good blood sugar levels. o The goal for blood sugar control after surgery is 80-180 mg/dL.       WHAT DO I DO ABOUT MY DIABETES MEDICATION?   Marland Kitchen Do not take oral diabetes medicines (pills) the morning of surgery. DO NOT take METFORMIN (Glucophage) the day of surgery.  . THE NIGHT BEFORE SURGERY, do not take your Humalog Insulin dose.      . THE MORNING OF SURGERY, do not take your insuline   . If your CBG is greater than 220 mg/dL, you may take  of your sliding scale (correction) dose of insulin.    Do not wear jewelry, make-up or nail polish.  Do  not wear lotions, powders, or perfumes, or deodorant.  Do not shave 48 hours prior to surgery.   Do not bring valuables to the hospital.   [facility] is not responsible for any belongings or valuables.  Contacts, dentures or bridgework may not be worn into surgery.  Leave your suitcase in the car.  After surgery it may be brought to your room.  For patients admitted to the hospital, discharge time will be determined by your treatment team.  Patients discharged the day of surgery will not be allowed to drive home.   Special instructions:   [facility]- Preparing For Surgery  Before surgery, you  can play an important role. Because skin is not sterile, your skin needs to be as free of germs as possible. You can reduce the number of germs on your skin by washing with CHG (chlorahexidine gluconate) Soap before surgery.  CHG is an antiseptic cleaner which kills germs and bonds with the skin to continue killing germs even after washing.  Please do not use if you have an allergy to CHG or antibacterial soaps. If your skin becomes reddened/irritated stop using the CHG.  Do not shave (including legs and underarms) for at least 48 hours prior to first CHG shower. It is OK to shave your face.  Please follow these instructions carefully.   1. Shower the NIGHT BEFORE SURGERY and the MORNING OF SURGERY with CHG.   2. If you chose to wash your hair, wash your hair first as usual with your normal shampoo.  3. After you shampoo, rinse your hair and body thoroughly to remove the shampoo.  4. Use CHG as you would any other liquid soap. You can apply CHG directly to the skin and wash gently with a scrungie or a clean washcloth.   5. Apply the CHG Soap to your body ONLY FROM THE NECK DOWN.  Do not use on open wounds or open sores. Avoid contact with your eyes, ears, mouth and genitals (private parts). Wash genitals (private parts) with your normal soap.  USE REGULAR SHAMPOO AND CONDITIONER FOR HAIR USE REGULAR SOAP FOR FACE AND PRIVATE AREA  6. Wash thoroughly, paying special attention to the area where your surgery will be performed.  7. Thoroughly rinse your body with warm water from the neck down.  8. DO NOT shower/wash with your normal soap after using and rinsing off the CHG Soap.  9. Pat yourself dry with a CLEAN TOWEL and WASH CLOTH  10. Wear CLEAN PAJAMAS to bed the night before surgery, wear comfortable clothes the morning of surgery  11. Place CLEAN SHEETS on your bed the night of your first shower and DO NOT SLEEP WITH PETS.    Day of Surgery: Do not apply any deodorants/lotions.  Please wear clean clothes to the hospital/surgery center.      Please read over the following fact sheets that you were given.

## 2016-12-10 NOTE — Progress Notes (Addendum)
PCP: Dr. Evlyn Kanner Cardiologist: Dr. Sharyn Lull last saw in Sept 2018  EKG: Today Stress Test: 09/2011 Cardiac Cath: "can't remember"  Fasting Blood Sugar- 150's Checks Blood Sugar twice a day  Patient denies shortness of breath, fever, cough, and chest pain at PAT appointment.  Patient verbalized understanding of instructions provided today at the PAT appointment.  Patient asked to review instructions at home and day of surgery.   Pt refuses all blood products.  Blood Refusal signed, faxed to lab, called Dr. Ethlyn Daniels with [facility] and made aware.

## 2016-12-11 NOTE — Progress Notes (Signed)
Anesthesia Chart Review:  Pt refuses blood products  Pt is a 71 year old female scheduled for R knee arthroscopy and debridement on 12/16/2016 with Aldean Baker, MD  - PCP is Adrian Prince, MD - Cardiologist is Rinaldo Cloud, MD, last office visit 11/23/16  PMH includes:  CAD, HTN, DM, thyroid disease. Former smoker. BMI 38  Medications include: Amlodipine, ASA 81 mg, benazepril, Humalog, levothyroxine, metformin, metoprolol  BP (!) 141/83   Pulse 93   Temp 36.5 C   Resp 20   Ht  (1.626 m)   Wt 222 lb 1.6 oz (100.7 kg)   SpO2 96%   BMI 38.12 kg/m   Preoperative labs reviewed.    EKG 12/10/16: NSR. Low voltage QRS. Possible Inferior infarct, age undetermined. Incomplete RBBB.  Appears stable when compared to EKG 07/20/14.   Nuclear stress test 09/21/11:  1.  No reversible ischemia or infarction. 2.  Normal wall motion. 3.  Left ventricular ejection fraction 69%  Echo 01/10/10:  1. LV systolic function mildly depressed with abnormal EF 45-49%. LV mass is normal though diastolic function appears abnormal.  2. Mild MR, PR, TR.  3. No intracardiac shunting. No intracardiac masses or thrombi. No pericardial effusion. No suggestion of pericardial tamponade.   Cardiac cath 06/07/07:  - LV showed inferior wall hypokinesia, EF of 50% - LM patent.   - LAD has 20-25% proximal stenosis.  D1 has 20-30% proximal stenosis.   - Left CX is patent proximally and then tapers down in AV groove for after giving off large OM-3.  OM-1 is very, very small with diffuse disease.  OM-2 is very small and patent.  OM-3 is large and patent.   - RCA has 40-50% ostial stenosis and 90 and 99% sequential proximal and proximal and mid junction stenosis with haziness.  PDA and PLV branches were small which are patent.  1. Left cardiac cath with selective left and right coronary angiography, left ventriculogram via right groin using Judkins technique.  2. Successful PTCA to proximal and mid junction of RCA  using 2.5 x 12 mm long Voyager balloon.  3. Successful deployment of 2.75 x 28 mm long Promus DES in proximal and mid RCA.  4. Successful post dilatation of this stent using 3 x 12 mm long [place] Voyager balloon.  If no changes, I anticipate pt can proceed with surgery as scheduled.   Rica Mast, FNP-BC [facility]/Anesthesiology Phone: [phone] 12/11/2016 1:58 PM

## 2016-12-15 MED ORDER — CEFAZOLIN SODIUM-DEXTROSE 2-4 GM/100ML-% IV SOLN
2.0000 g | INTRAVENOUS | Status: AC
  Administered 2016-12-16: 2 g via INTRAVENOUS
  Filled 2016-12-15: qty 100

## 2016-12-16 ENCOUNTER — Ambulatory Visit (HOSPITAL_COMMUNITY): Payer: Medicare Other | Admitting: Anesthesiology

## 2016-12-16 ENCOUNTER — Ambulatory Visit (HOSPITAL_COMMUNITY): Payer: Medicare Other | Admitting: Emergency Medicine

## 2016-12-16 ENCOUNTER — Encounter (HOSPITAL_COMMUNITY)
Admission: RE | Disposition: A | Discharge status: Home / Self Care | Payer: Self-pay | Source: Ambulatory Visit | Attending: Orthopedic Surgery

## 2016-12-16 ENCOUNTER — Encounter (HOSPITAL_COMMUNITY): Payer: Self-pay | Admitting: *Deleted

## 2016-12-16 ENCOUNTER — Ambulatory Visit (HOSPITAL_COMMUNITY)
Admission: RE | Admit: 2016-12-16 | Discharge: 2016-12-16 | Disposition: A | Discharge status: Home / Self Care | Payer: Medicare Other | Source: Ambulatory Visit | Attending: Orthopedic Surgery | Admitting: Orthopedic Surgery

## 2016-12-16 DIAGNOSIS — Z87891 Personal history of nicotine dependence: Secondary | ICD-10-CM | POA: Insufficient documentation

## 2016-12-16 DIAGNOSIS — Z6838 Body mass index (BMI) 38.0-38.9, adult: Secondary | ICD-10-CM | POA: Diagnosis not present

## 2016-12-16 DIAGNOSIS — Z7982 Long term (current) use of aspirin: Secondary | ICD-10-CM | POA: Insufficient documentation

## 2016-12-16 DIAGNOSIS — Z955 Presence of coronary angioplasty implant and graft: Secondary | ICD-10-CM | POA: Diagnosis not present

## 2016-12-16 DIAGNOSIS — M2341 Loose body in knee, right knee: Secondary | ICD-10-CM | POA: Diagnosis not present

## 2016-12-16 DIAGNOSIS — Z79899 Other long term (current) drug therapy: Secondary | ICD-10-CM | POA: Diagnosis not present

## 2016-12-16 DIAGNOSIS — E119 Type 2 diabetes mellitus without complications: Secondary | ICD-10-CM | POA: Insufficient documentation

## 2016-12-16 DIAGNOSIS — M23251 Derangement of posterior horn of lateral meniscus due to old tear or injury, right knee: Secondary | ICD-10-CM | POA: Diagnosis not present

## 2016-12-16 DIAGNOSIS — I10 Essential (primary) hypertension: Secondary | ICD-10-CM | POA: Diagnosis not present

## 2016-12-16 DIAGNOSIS — H04123 Dry eye syndrome of bilateral lacrimal glands: Secondary | ICD-10-CM | POA: Insufficient documentation

## 2016-12-16 DIAGNOSIS — E669 Obesity, unspecified: Secondary | ICD-10-CM | POA: Diagnosis not present

## 2016-12-16 DIAGNOSIS — M23200 Derangement of unspecified lateral meniscus due to old tear or injury, right knee: Secondary | ICD-10-CM

## 2016-12-16 DIAGNOSIS — Z794 Long term (current) use of insulin: Secondary | ICD-10-CM | POA: Diagnosis not present

## 2016-12-16 DIAGNOSIS — I252 Old myocardial infarction: Secondary | ICD-10-CM | POA: Insufficient documentation

## 2016-12-16 DIAGNOSIS — F419 Anxiety disorder, unspecified: Secondary | ICD-10-CM | POA: Diagnosis not present

## 2016-12-16 DIAGNOSIS — M1711 Unilateral primary osteoarthritis, right knee: Secondary | ICD-10-CM | POA: Insufficient documentation

## 2016-12-16 DIAGNOSIS — I2510 Atherosclerotic heart disease of native coronary artery without angina pectoris: Secondary | ICD-10-CM | POA: Insufficient documentation

## 2016-12-16 DIAGNOSIS — F319 Bipolar disorder, unspecified: Secondary | ICD-10-CM | POA: Insufficient documentation

## 2016-12-16 HISTORY — PX: KNEE ARTHROSCOPY: SHX127

## 2016-12-16 LAB — GLUCOSE, CAPILLARY
GLUCOSE-CAPILLARY: 145 mg/dL — AB (ref 65–99)
GLUCOSE-CAPILLARY: 162 mg/dL — AB (ref 65–99)

## 2016-12-16 SURGERY — ARTHROSCOPY, KNEE
Anesthesia: General | Site: Knee | Laterality: Right

## 2016-12-16 MED ORDER — FENTANYL CITRATE (PF) 250 MCG/5ML IJ SOLN
INTRAMUSCULAR | Status: AC
  Filled 2016-12-16: qty 5

## 2016-12-16 MED ORDER — ONDANSETRON HCL 4 MG/2ML IJ SOLN
INTRAMUSCULAR | Status: DC | PRN
  Administered 2016-12-16: 4 mg via INTRAVENOUS

## 2016-12-16 MED ORDER — FENTANYL CITRATE (PF) 100 MCG/2ML IJ SOLN
INTRAMUSCULAR | Status: DC | PRN
  Administered 2016-12-16 (×2): 50 ug via INTRAVENOUS

## 2016-12-16 MED ORDER — HYDROMORPHONE HCL 1 MG/ML IJ SOLN: 0.2500 mg | INTRAMUSCULAR | Status: DC | PRN

## 2016-12-16 MED ORDER — BUPIVACAINE HCL (PF) 0.25 % IJ SOLN
INTRAMUSCULAR | Status: AC
  Filled 2016-12-16: qty 30

## 2016-12-16 MED ORDER — MEPERIDINE HCL 25 MG/ML IJ SOLN: 6.2500 mg | INTRAMUSCULAR | Status: DC | PRN

## 2016-12-16 MED ORDER — MIDAZOLAM HCL 2 MG/2ML IJ SOLN
INTRAMUSCULAR | Status: AC
  Filled 2016-12-16: qty 2

## 2016-12-16 MED ORDER — LIDOCAINE 2% (20 MG/ML) 5 ML SYRINGE
INTRAMUSCULAR | Status: AC
  Filled 2016-12-16: qty 5

## 2016-12-16 MED ORDER — HYDROCODONE-ACETAMINOPHEN 5-325 MG PO TABS: 1.0000 | ORAL_TABLET | ORAL | 0 refills | Status: DC | PRN

## 2016-12-16 MED ORDER — LIDOCAINE HCL (CARDIAC) 20 MG/ML IV SOLN
INTRAVENOUS | Status: DC | PRN
  Administered 2016-12-16: 100 mg via INTRAVENOUS

## 2016-12-16 MED ORDER — LACTATED RINGERS IV SOLN
INTRAVENOUS | Status: DC | PRN
  Administered 2016-12-16: 08:00:00 via INTRAVENOUS

## 2016-12-16 MED ORDER — BUPIVACAINE HCL (PF) 0.25 % IJ SOLN
INTRAMUSCULAR | Status: DC | PRN
  Administered 2016-12-16: 20 mL

## 2016-12-16 MED ORDER — OXYCODONE HCL 5 MG/5ML PO SOLN: 5.0000 mg | Freq: Once | ORAL | Status: DC | PRN

## 2016-12-16 MED ORDER — CHLORHEXIDINE GLUCONATE 4 % EX LIQD: 60.0000 mL | Freq: Once | CUTANEOUS | Status: DC

## 2016-12-16 MED ORDER — ONDANSETRON HCL 4 MG/2ML IJ SOLN
INTRAMUSCULAR | Status: AC
  Filled 2016-12-16: qty 2

## 2016-12-16 MED ORDER — PROPOFOL 10 MG/ML IV BOLUS
INTRAVENOUS | Status: DC | PRN
  Administered 2016-12-16: 150 mg via INTRAVENOUS

## 2016-12-16 MED ORDER — OXYCODONE HCL 5 MG PO TABS: 5.0000 mg | ORAL_TABLET | Freq: Once | ORAL | Status: DC | PRN

## 2016-12-16 MED ORDER — PROMETHAZINE HCL 25 MG/ML IJ SOLN: 6.2500 mg | INTRAMUSCULAR | Status: DC | PRN

## 2016-12-16 MED ORDER — PROPOFOL 10 MG/ML IV BOLUS
INTRAVENOUS | Status: AC
  Filled 2016-12-16: qty 40

## 2016-12-16 MED ORDER — SODIUM CHLORIDE 0.9 % IR SOLN
Status: DC | PRN
  Administered 2016-12-16: 6000 mL

## 2016-12-16 SURGICAL SUPPLY — 41 items
BLADE CUDA 5.5 (BLADE) IMPLANT
BLADE GREAT WHITE 4.2 (BLADE) IMPLANT
BLADE GREAT WHITE 4.2MM (BLADE)
BNDG COHESIVE 6X5 TAN STRL LF (GAUZE/BANDAGES/DRESSINGS) ×3 IMPLANT
BNDG GAUZE ELAST 4 BULKY (GAUZE/BANDAGES/DRESSINGS) ×2 IMPLANT
BUR OVAL 6.0 (BURR) IMPLANT
COVER SURGICAL LIGHT HANDLE (MISCELLANEOUS) ×6 IMPLANT
CUFF TOURNIQUET SINGLE 34IN LL (TOURNIQUET CUFF) IMPLANT
CUFF TOURNIQUET SINGLE 44IN (TOURNIQUET CUFF) IMPLANT
DECANTER SPIKE VIAL GLASS SM (MISCELLANEOUS) ×2 IMPLANT
DRAPE ARTHROSCOPY W/POUCH 114 (DRAPES) ×3 IMPLANT
DRAPE U-SHAPE 47X51 STRL (DRAPES) ×3 IMPLANT
DRSG EMULSION OIL 3X3 NADH (GAUZE/BANDAGES/DRESSINGS) ×3 IMPLANT
DRSG PAD ABDOMINAL 8X10 ST (GAUZE/BANDAGES/DRESSINGS) ×3 IMPLANT
DURAPREP 26ML APPLICATOR (WOUND CARE) ×3 IMPLANT
GAUZE SPONGE 4X4 12PLY STRL (GAUZE/BANDAGES/DRESSINGS) ×3 IMPLANT
GAUZE SPONGE 4X4 12PLY STRL LF (GAUZE/BANDAGES/DRESSINGS) ×2 IMPLANT
GAUZE SPONGE 4X4 16PLY XRAY LF (GAUZE/BANDAGES/DRESSINGS) ×2 IMPLANT
GLOVE BIOGEL PI IND STRL 9 (GLOVE) ×1 IMPLANT
GLOVE BIOGEL PI INDICATOR 9 (GLOVE) ×2
GLOVE SURG ORTHO 9.0 STRL STRW (GLOVE) ×3 IMPLANT
GOWN STRL REUS W/ TWL XL LVL3 (GOWN DISPOSABLE) ×3 IMPLANT
GOWN STRL REUS W/TWL XL LVL3 (GOWN DISPOSABLE) ×9
KIT BASIN OR (CUSTOM PROCEDURE TRAY) ×3 IMPLANT
KIT ROOM TURNOVER OR (KITS) ×3 IMPLANT
MANIFOLD NEPTUNE II (INSTRUMENTS) ×3 IMPLANT
NDL 18GX1X1/2 (RX/OR ONLY) (NEEDLE) ×1 IMPLANT
NEEDLE 18GX1X1/2 (RX/OR ONLY) (NEEDLE) ×3 IMPLANT
PACK ARTHROSCOPY DSU (CUSTOM PROCEDURE TRAY) ×3 IMPLANT
PAD ABD 8X10 STRL (GAUZE/BANDAGES/DRESSINGS) ×2 IMPLANT
PAD ARMBOARD 7.5X6 YLW CONV (MISCELLANEOUS) ×6 IMPLANT
PADDING CAST COTTON 6X4 STRL (CAST SUPPLIES) ×3 IMPLANT
SET ARTHROSCOPY TUBING (MISCELLANEOUS) ×3
SET ARTHROSCOPY TUBING LN (MISCELLANEOUS) ×1 IMPLANT
SUT ETHILON 4 0 PS 2 18 (SUTURE) ×3 IMPLANT
SYR 20CC LL (SYRINGE) ×3 IMPLANT
TOWEL OR 17X24 6PK STRL BLUE (TOWEL DISPOSABLE) ×6 IMPLANT
TUBE CONNECTING 12'X1/4 (SUCTIONS) ×1
TUBE CONNECTING 12X1/4 (SUCTIONS) ×2 IMPLANT
WAND HAND CNTRL MULTIVAC 90 (MISCELLANEOUS) IMPLANT
WATER STERILE IRR 1000ML POUR (IV SOLUTION) ×3 IMPLANT

## 2016-12-16 NOTE — Transfer of Care (Signed)
Immediate Anesthesia Transfer of Care Note  Patient: Marilyn Sexton  Procedure(s) Performed: RIGHT KNEE ARTHROSCOPY AND DEBRIDEMENT (Right Knee)  Patient Location: PACU  Anesthesia Type:General  Level of Consciousness: awake, alert , oriented and sedated  Airway & Oxygen Therapy: Patient Spontanous Breathing and Patient connected to nasal cannula oxygen  Post-op Assessment: Report given to RN, Post -op Vital signs reviewed and stable and Patient moving all extremities  Post vital signs: Reviewed and stable  Last Vitals:  Vitals:   12/16/16 0708 12/16/16 0925  BP:    Pulse:  (P) 78  Resp:  (P) 12  Temp: 36.7 C (P) 36.4 C  SpO2:  (P) 99%    Last Pain:  Vitals:   12/16/16 0620  TempSrc: Oral      Patients Stated Pain Goal: 3 (12/16/16 4696)  Complications: No apparent anesthesia complications

## 2016-12-16 NOTE — H&P (Signed)
Marilyn Sexton is an 71 y.o. female.   Chief Complaint: mechanical symptoms right knee with catching and locking and giving way. HPI: patient is a 71 year old woman who has been having progressive mechanical symptoms of her right knee. She has failed conservative care she has had an MRI scan which shows lateral meniscal tear and loose body and tricompartmental arthritis.  Past Medical History:  Diagnosis Date  . Diabetes mellitus without complication (HCC)   . Dyspnea   . Hypertension   . Myocardial infarction (HCC)   . Thyroid disease     Past Surgical History:  Procedure Laterality Date  . CARDIAC CATHETERIZATION    . CHOLECYSTECTOMY    . coronary stents    . EYE SURGERY     bilateral cataracts  . TUBAL LIGATION      History reviewed. No pertinent family history. Social History:  reports that she quit smoking about 12 years ago. Her smoking use included Cigarettes. She has a 44.00 pack-year smoking history. She has never used smokeless tobacco. She reports that she does not drink alcohol or use drugs.  Allergies: No Known Allergies  Medications Prior to Admission  Medication Sig Dispense Refill  . amLODipine (NORVASC) 5 MG tablet Take 5 mg by mouth daily.    Marland Kitchen aspirin EC 81 MG tablet Take 81 mg by mouth every evening.    . benazepril (LOTENSIN) 10 MG tablet Take 1 tablet (10 mg total) by mouth daily. (Patient taking differently: Take 20 mg by mouth 2 (two) times daily. ) 30 tablet 0  . benztropine (COGENTIN) 0.5 MG tablet Take 1 tablet (0.5 mg total) by mouth 2 (two) times daily. 30 tablet 0  . ibuprofen (ADVIL,MOTRIN) 200 MG tablet Take 200 mg by mouth 2 (two) times daily.     . insulin lispro (HUMALOG) 100 UNIT/ML injection Inject 12-14 Units into the skin See admin instructions. Inject 12 units SQ in the morning and inject 14 units SQ at night     . levothyroxine (SYNTHROID, LEVOTHROID) 50 MCG tablet Take 50 mcg by mouth daily before breakfast.    . metFORMIN  (GLUCOPHAGE) 1000 MG tablet Take 1,000 mg by mouth 2 (two) times daily.    . metoprolol tartrate (LOPRESSOR) 25 MG tablet Take 1 tablet (25 mg total) by mouth daily. (Patient taking differently: Take 50 mg by mouth 2 (two) times daily. ) 30 tablet 0  . mirabegron ER (MYRBETRIQ) 50 MG TB24 tablet Take 50 mg by mouth daily.    Marland Kitchen Propylene Glycol (SYSTANE BALANCE) 0.6 % SOLN Place 2 drops into both eyes as needed (for dry eyes).    . risperiDONE (RISPERDAL) 0.5 MG tablet Take 1 tablet (0.5 mg total) by mouth at bedtime. (Patient taking differently: Take 1 mg by mouth 2 (two) times daily. ) 30 tablet 0  . hydrOXYzine (ATARAX/VISTARIL) 50 MG tablet Take 1 tablet (50 mg total) by mouth every 8 (eight) hours as needed for anxiety. (Patient not taking: Reported on 12/09/2016) 30 tablet 0  . sertraline (ZOLOFT) 25 MG tablet Take 1 tablet (25 mg total) by mouth daily. (Patient not taking: Reported on 12/09/2016) 30 tablet 0    Results for orders placed or performed during the hospital encounter of 12/16/16 (from the past 48 hour(s))  Glucose, capillary     Status: Abnormal   Collection Time: 12/16/16  6:28 AM  Result Value Ref Range   Glucose-Capillary 145 (H) 65 - 99 mg/dL   Comment 1 Notify RN  Comment 2 Document in Chart    No results found.  Review of Systems  All other systems reviewed and are negative.   Blood pressure (!) 191/87, pulse 88, temperature (!) 97.5 F (36.4 C), temperature source Oral, resp. rate 18, weight 222 lb (100.7 kg), SpO2 96 %. Physical Exam  Examination patient is alert oriented no adenopathy well-dressed normal affect and was transferred she has antalgic gait right knee does have an effusion she is tender to palpation over the medial and lateral joint lines) cruciate are stable there is crepitation with range of motion. MRI scan shows meniscal tear as well as loose body and osteoarthritis. Assessment/Plan Assessment: Right knee loose body with meniscal tear and  osteoarthritis.  Plan: We'll plan for right knee arthroscopy risks and benefits were discussed including persistent pain and need for additional surgery potential need for total knee arthroplasty. Patient states she understands wish to proceed at this time.  Nadara Mustard, MD 12/16/2016, 7:08 AM

## 2016-12-16 NOTE — Addendum Note (Signed)
Addendum  created 12/16/16 1707 by Fransisca Kaufmann, CRNA   Anesthesia Event edited

## 2016-12-16 NOTE — Anesthesia Postprocedure Evaluation (Signed)
Anesthesia Post Note  Patient: Marilyn Sexton  Procedure(s) Performed: RIGHT KNEE ARTHROSCOPY AND DEBRIDEMENT (Right Knee)     Patient location during evaluation: PACU Anesthesia Type: General Level of consciousness: awake and alert Pain management: pain level controlled Vital Signs Assessment: post-procedure vital signs reviewed and stable Respiratory status: spontaneous breathing, nonlabored ventilation and respiratory function stable Cardiovascular status: blood pressure returned to baseline and stable Postop Assessment: no apparent nausea or vomiting Anesthetic complications: no    Last Vitals:  Vitals:   12/16/16 0945 12/16/16 0950  BP: (!) 168/92 (!) 168/92  Pulse: 80 80  Resp: 18 19  Temp:  36.6 C  SpO2: 96% 96%    Last Pain:  Vitals:   12/16/16 1000  TempSrc:   PainSc: 0-No pain                 Lowella Curb

## 2016-12-16 NOTE — Anesthesia Preprocedure Evaluation (Signed)
Anesthesia Evaluation  Patient identified by MRN, date of birth, ID band Patient awake    Reviewed: Allergy & Precautions, NPO status , Patient's Chart, lab work & pertinent test results, reviewed documented beta blocker date and time   Airway Mallampati: II  TM Distance: >3 FB Neck ROM: Full    Dental no notable dental hx.    Pulmonary neg pulmonary ROS, former smoker,    Pulmonary exam normal breath sounds clear to auscultation       Cardiovascular hypertension, Pt. on medications and Pt. on home beta blockers + CAD  negative cardio ROS Normal cardiovascular exam Rhythm:Regular Rate:Normal     Neuro/Psych Bipolar Disorder negative neurological ROS  negative psych ROS   GI/Hepatic negative GI ROS, Neg liver ROS,   Endo/Other  negative endocrine ROSdiabetes, Type 2, Insulin Dependent, Oral Hypoglycemic Agents  Renal/GU negative Renal ROS  negative genitourinary   Musculoskeletal negative musculoskeletal ROS (+)   Abdominal (+) + obese,   Peds negative pediatric ROS (+)  Hematology negative hematology ROS (+)   Anesthesia Other Findings   Reproductive/Obstetrics negative OB ROS                             Anesthesia Physical Anesthesia Plan  ASA: III  Anesthesia Plan: General   Post-op Pain Management:    Induction: Intravenous  PONV Risk Score and Plan: 3 and Ondansetron, Dexamethasone, Midazolam and Treatment may vary due to age or medical condition  Airway Management Planned: LMA  Additional Equipment:   Intra-op Plan:   Post-operative Plan: Extubation in OR  Informed Consent: I have reviewed the patients History and Physical, chart, labs and discussed the procedure including the risks, benefits and alternatives for the proposed anesthesia with the patient or authorized representative who has indicated his/her understanding and acceptance.   Dental advisory given  Plan  Discussed with: CRNA  Anesthesia Plan Comments:         Anesthesia Quick Evaluation

## 2016-12-16 NOTE — Anesthesia Procedure Notes (Signed)
Procedure Name: LMA Insertion Date/Time: 12/16/2016 8:38 AM Performed by: Fransisca Kaufmann Pre-anesthesia Checklist: Patient identified, Emergency Drugs available, Suction available and Patient being monitored Patient Re-evaluated:Patient Re-evaluated prior to induction Oxygen Delivery Method: Circle System Utilized Preoxygenation: Pre-oxygenation with 100% oxygen Induction Type: IV induction Ventilation: Mask ventilation without difficulty LMA: LMA inserted LMA Size: 4.0 Number of attempts: 1 Placement Confirmation: positive ETCO2 Tube secured with: Tape Dental Injury: Teeth and Oropharynx as per pre-operative assessment

## 2016-12-16 NOTE — Op Note (Signed)
12/16/2016  9:18 AM  PATIENT:  Marilyn Sexton    PRE-OPERATIVE DIAGNOSIS:  Lateral Meniscal Tear and Loose Bodies Right Knee  POST-OPERATIVE DIAGNOSIS:  Same  PROCEDURE:  RIGHT KNEE ARTHROSCOPY AND DEBRIDEMENT and removal of loose body 10 mm in diameter  SURGEON:  Nadara Mustard, MD  PHYSICIAN ASSISTANT:None ANESTHESIA:   General  PREOPERATIVE INDICATIONS:  Marilyn Sexton is a  71 y.o. female with a diagnosis of Lateral Meniscal Tear and Loose Bodies Right Knee who failed conservative measures and elected for surgical management.    The risks benefits and alternatives were discussed with the patient preoperatively including but not limited to the risks of infection, bleeding, nerve injury, cardiopulmonary complications, the need for revision surgery, among others, and the patient was willing to proceed.  OPERATIVE IMPLANTS: None  OPERATIVE FINDINGS: Multiple loose bodies approximately 10 mm diameter with large complex tear of the posterior horn lateral meniscus  OPERATIVE PROCEDURE: Patient brought the operating room and underwent a general anesthetic. After adequate levels anesthesia were obtained patient's right lower extremity was prepped using DuraPrep draped into a sterile field a timeout was called. The scope was inserted into the anterior lateral portal and anterior medial working portal was established. Visualization the medial joint line surgery some mild fraying of the medial meniscus but no osteochondral defect of the medial joint line no significant meniscal tear. The notch showed the anterior cruciate ligament was intact. Figure 4 position showed a large complex tear of the lateral meniscus with osteochondral changes of the femur and tibia. A biter and shaver were used to debride the meniscus back to stable viable margin. Examination the knee in the knee straight position and the suprapatella pouch did have multiple loose bodies these were removed. The medial and lateral  gutters were checked and were no loose bodies. Survey of all compartments again show there to be no loose bodies. The large 10 mm fragment was removed without complications. The knee was injected postoperatively with quarter percent Marcaine plain portals were closed a sterile dressing was applied patient was extubated taken to PACU in stable condition.

## 2016-12-17 ENCOUNTER — Encounter (HOSPITAL_COMMUNITY): Payer: Self-pay | Admitting: Orthopedic Surgery

## 2016-12-23 ENCOUNTER — Ambulatory Visit (INDEPENDENT_AMBULATORY_CARE_PROVIDER_SITE_OTHER): Payer: Medicare Other | Admitting: Family

## 2016-12-23 ENCOUNTER — Encounter (INDEPENDENT_AMBULATORY_CARE_PROVIDER_SITE_OTHER): Payer: Self-pay | Admitting: Family

## 2016-12-23 DIAGNOSIS — M23200 Derangement of unspecified lateral meniscus due to old tear or injury, right knee: Secondary | ICD-10-CM

## 2016-12-23 DIAGNOSIS — M2341 Loose body in knee, right knee: Secondary | ICD-10-CM

## 2016-12-23 NOTE — Progress Notes (Signed)
   Post-Op Visit Note   Patient: Marilyn SaneBonnie F Sexton           Date of Birth: Oct 21, 1945           MRN: [id number] Visit Date: 12/23/2016 PCP: Adrian PrinceSouth, Stephen, MD  Chief Complaint:  Chief Complaint  Patient presents with  . Right Knee - Pain    HPI:  HPI The patient is 71 year old woman seen 1 week status post arthroscopy of her right knee for a meniscal tear. Has been doing. Well states has less pain than did pre op. Pleased with progress. Feels will not need walker much longer.  Ortho Exam Portals are clean and dry. No erythema. Moderate swelling. No effusion.  Visit Diagnoses:  1. Old complex tear of lateral meniscus of right knee   2. Loose body in knee, right knee     Plan: harvest sutures. Progressive ambulation. Will follow up in office in 3-4 weeks.   Follow-Up Instructions: No Follow-up on file.   Imaging: No results found.  Orders:  No orders of the defined types were placed in this encounter.  No orders of the defined types were placed in this encounter.    PMFS History: Patient Active Problem List   Diagnosis Date Noted  . Old complex tear of lateral meniscus of right knee   . Loose body in knee, right knee   . Bipolar affective disorder, manic, moderate (HCC) 07/23/2014  . History of medication noncompliance   . Manic behavior (HCC)    Past Medical History:  Diagnosis Date  . Diabetes mellitus without complication (HCC)   . Dyspnea   . Hypertension   . Myocardial infarction (HCC)   . Thyroid disease     No family history on file.  Past Surgical History:  Procedure Laterality Date  . CARDIAC CATHETERIZATION    . CHOLECYSTECTOMY    . coronary stents    . EYE SURGERY     bilateral cataracts  . KNEE ARTHROSCOPY Right 12/16/2016   Procedure: RIGHT KNEE ARTHROSCOPY AND DEBRIDEMENT;  Surgeon: Nadara Mustarduda, Marcus V, MD;  [facility];  Service: Orthopedics;  Laterality: Right;  . TUBAL LIGATION     Social History   Occupational History  . Not on  file.   Social History Main Topics  . Smoking status: Former Smoker    Packs/day: 1.00    Years: 44.00    Types: Cigarettes    Quit date: 2006  . Smokeless tobacco: Never Used  . Alcohol use No  . Drug use: No  . Sexual activity: Not on file

## 2016-12-28 ENCOUNTER — Encounter (INDEPENDENT_AMBULATORY_CARE_PROVIDER_SITE_OTHER): Payer: Self-pay | Admitting: Orthopedic Surgery

## 2016-12-28 ENCOUNTER — Ambulatory Visit (INDEPENDENT_AMBULATORY_CARE_PROVIDER_SITE_OTHER): Payer: Medicare Other | Admitting: Orthopedic Surgery

## 2016-12-28 DIAGNOSIS — M23200 Derangement of unspecified lateral meniscus due to old tear or injury, right knee: Secondary | ICD-10-CM

## 2016-12-28 NOTE — Progress Notes (Signed)
Office Visit Note   Patient: Marilyn Sexton           Date of Birth: [birth year]           MRN: [id number] Visit Date: 12/28/2016              Requested by: Adrian Prince, MD [address] [place], [place] [address] PCP: Adrian Prince, MD  Chief Complaint  Patient presents with  . Right Knee - Pain  . Right Leg - Pain      HPI: Patient is two-week status post right knee arthroscopy.  She states that she has been having some increasing knee pain and popping.  She states immediately after surgery she had very minimal symptoms.  She states she has been having increasing swelling and bruising in her medial thigh.  Assessment & Plan: Visit Diagnoses:  1. Old complex tear of lateral meniscus of right knee     Plan: Recommended isometric straight leg raises.  Patient states she does not want to go to formalized physical therapy.  Recommended elevation with her foot level with a heart and recommended walking and exercising to strengthen the knee and help decrease the swelling.  Follow-Up Instructions: Return in about 2 weeks (around 01/11/2017).   Ortho Exam  Patient is alert, oriented, no adenopathy, well-dressed, normal affect, normal respiratory effort. Examination patient's portals are clean and dry there is no pain with range of motion there is some crepitation in the knee.  There is no effusion.  The calf is soft nontender no signs or symptoms of DVT dorsiflexion of the ankle is not tender.  Imaging: No results found. No images are attached to the encounter.  Labs: Lab Results  Component Value Date   HGBA1C 5.5 12/10/2016   HGBA1C (H) 06/07/2007    6.8 (NOTE)   The ADA recommends the following therapeutic goals for glycemic   control related to Hgb A1C measurement:   Goal of Therapy:   < 7.0% Hgb A1C   Action Suggested:  > 8.0% Hgb A1C   Ref:  Diabetes Care, 22, Suppl. 1, 1999   REPTSTATUS 04/14/2016 FINAL 04/12/2016   CULT >=100,000 COLONIES/mL ESCHERICHIA COLI (A)  04/12/2016   LABORGA ESCHERICHIA COLI (A) 04/12/2016    Orders:  No orders of the defined types were placed in this encounter.  No orders of the defined types were placed in this encounter.    Procedures: No procedures performed  Clinical Data: No additional findings.  ROS:  All other systems negative, except as noted in the HPI. Review of Systems  Objective: Vital Signs: There were no vitals taken for this visit.  Specialty Comments:  No specialty comments available.  PMFS History: Patient Active Problem List   Diagnosis Date Noted  . Old complex tear of lateral meniscus of right knee   . Loose body in knee, right knee   . Bipolar affective disorder, manic, moderate (HCC) 07/23/2014  . History of medication noncompliance   . Manic behavior (HCC)    Past Medical History:  Diagnosis Date  . Diabetes mellitus without complication (HCC)   . Dyspnea   . Hypertension   . Myocardial infarction (HCC)   . Thyroid disease     History reviewed. No pertinent family history.  Past Surgical History:  Procedure Laterality Date  . CARDIAC CATHETERIZATION    . CHOLECYSTECTOMY    . coronary stents    . EYE SURGERY     bilateral cataracts  . KNEE ARTHROSCOPY Right  12/16/2016   Procedure: RIGHT KNEE ARTHROSCOPY AND DEBRIDEMENT;  Surgeon: Nadara Mustarduda, Latunya Kissick V, MD;  [facility];  Service: Orthopedics;  Laterality: Right;  . TUBAL LIGATION     Social History   Occupational History  . Not on file.   Social History Main Topics  . Smoking status: Former Smoker    Packs/day: 1.00    Years: 44.00    Types: Cigarettes    Quit date: 2006  . Smokeless tobacco: Never Used  . Alcohol use No  . Drug use: No  . Sexual activity: Not on file

## 2017-01-13 ENCOUNTER — Ambulatory Visit (INDEPENDENT_AMBULATORY_CARE_PROVIDER_SITE_OTHER): Payer: Medicare Other | Admitting: Family

## 2017-01-13 ENCOUNTER — Encounter (INDEPENDENT_AMBULATORY_CARE_PROVIDER_SITE_OTHER): Payer: Self-pay | Admitting: Family

## 2017-01-13 DIAGNOSIS — M23200 Derangement of unspecified lateral meniscus due to old tear or injury, right knee: Secondary | ICD-10-CM

## 2017-01-13 DIAGNOSIS — M2341 Loose body in knee, right knee: Secondary | ICD-10-CM

## 2017-01-13 NOTE — Progress Notes (Signed)
Office Visit Note   Patient: Marilyn Sexton           Date of Birth: 1945-05-01           MRN: [id number] Visit Date: 01/13/2017              Requested by: Adrian PrinceSouth, Stephen, MD [address] [place], [place] [id number] PCP: Adrian PrinceSouth, Stephen, MD  No chief complaint on file.     HPI: Patient is seen today 4 weeks status post right knee arthroscopy.  She states that she has been having some continued knee on right popping.  Complains of some continued to the knee as well. Overall feels is improving slowly. No questions.   Assessment & Plan: Visit Diagnoses:  1. Loose body in knee, right knee   2. Old complex tear of lateral meniscus of right knee     Plan: Recommended she continue with isometric straight leg raises.  Recommended walking and exercising to strengthen the knee and help decrease the swelling. Will follow up in 4 more weeks if continued concerns or pain.   Follow-Up Instructions: No Follow-up on file.   Ortho Exam  Patient is alert, oriented, no adenopathy, well-dressed, normal affect, normal respiratory effort. Examination patient's portals are clean and dry there is no pain with range of motion there is some crepitation in the knee.  There is no effusion.  The calf is soft nontender no signs or symptoms of DVT dorsiflexion of the ankle is not tender.  Imaging: No results found. No images are attached to the encounter.  Labs: Lab Results  Component Value Date   HGBA1C 5.5 12/10/2016   HGBA1C (H) 06/07/2007    6.8 (NOTE)   The ADA recommends the following therapeutic goals for glycemic   control related to Hgb A1C measurement:   Goal of Therapy:   < 7.0% Hgb A1C   Action Suggested:  > 8.0% Hgb A1C   Ref:  Diabetes Care, 22, Suppl. 1, 1999   REPTSTATUS 04/14/2016 FINAL 04/12/2016   CULT >=100,000 COLONIES/mL ESCHERICHIA COLI (A) 04/12/2016   LABORGA ESCHERICHIA COLI (A) 04/12/2016    Orders:  No orders of the defined types were placed in this  encounter.  No orders of the defined types were placed in this encounter.    Procedures: No procedures performed  Clinical Data: No additional findings.  ROS:  All other systems negative, except as noted in the HPI. Review of Systems  Constitutional: Negative for chills and fever.  Musculoskeletal: Positive for arthralgias and joint swelling. Negative for myalgias.    Objective: Vital Signs: There were no vitals taken for this visit.  Specialty Comments:  No specialty comments available.  PMFS History: Patient Active Problem List   Diagnosis Date Noted  . Old complex tear of lateral meniscus of right knee   . Loose body in knee, right knee   . Bipolar affective disorder, manic, moderate (HCC) 07/23/2014  . History of medication noncompliance   . Manic behavior (HCC)    Past Medical History:  Diagnosis Date  . Diabetes mellitus without complication (HCC)   . Dyspnea   . Hypertension   . Myocardial infarction (HCC)   . Thyroid disease     History reviewed. No pertinent family history.  Past Surgical History:  Procedure Laterality Date  . CARDIAC CATHETERIZATION    . CHOLECYSTECTOMY    . coronary stents    . EYE SURGERY     bilateral cataracts  . TUBAL LIGATION  Social History   Occupational History  . Not on file  Tobacco Use  . Smoking status: Former Smoker    Packs/day: 1.00    Years: 44.00    Pack years: 44.00    Types: Cigarettes    Last attempt to quit: 2006    Years since quitting: 12.8  . Smokeless tobacco: Never Used  Substance and Sexual Activity  . Alcohol use: No  . Drug use: No  . Sexual activity: Not on file

## 2017-01-27 ENCOUNTER — Telehealth (INDEPENDENT_AMBULATORY_CARE_PROVIDER_SITE_OTHER): Payer: Self-pay | Admitting: Orthopedic Surgery

## 2017-01-27 NOTE — Telephone Encounter (Signed)
10/21/2016 [phone]

## 2017-02-20 ENCOUNTER — Emergency Department (HOSPITAL_COMMUNITY): Payer: Medicare Other

## 2017-02-20 ENCOUNTER — Other Ambulatory Visit: Payer: Self-pay

## 2017-02-20 ENCOUNTER — Emergency Department (HOSPITAL_COMMUNITY)
Admission: EM | Admit: 2017-02-20 | Discharge: 2017-02-20 | Disposition: A | Discharge status: Home / Self Care | Payer: Medicare Other | Attending: Emergency Medicine | Admitting: Emergency Medicine

## 2017-02-20 ENCOUNTER — Encounter (HOSPITAL_COMMUNITY): Payer: Self-pay

## 2017-02-20 ENCOUNTER — Emergency Department (HOSPITAL_BASED_OUTPATIENT_CLINIC_OR_DEPARTMENT_OTHER): Admit: 2017-02-20 | Discharge: 2017-02-20 | Disposition: A | Discharge status: Home / Self Care | Payer: Medicare Other

## 2017-02-20 DIAGNOSIS — E119 Type 2 diabetes mellitus without complications: Secondary | ICD-10-CM | POA: Diagnosis not present

## 2017-02-20 DIAGNOSIS — M79609 Pain in unspecified limb: Secondary | ICD-10-CM

## 2017-02-20 DIAGNOSIS — Z79899 Other long term (current) drug therapy: Secondary | ICD-10-CM | POA: Insufficient documentation

## 2017-02-20 DIAGNOSIS — Z794 Long term (current) use of insulin: Secondary | ICD-10-CM | POA: Insufficient documentation

## 2017-02-20 DIAGNOSIS — I252 Old myocardial infarction: Secondary | ICD-10-CM | POA: Diagnosis not present

## 2017-02-20 DIAGNOSIS — G8929 Other chronic pain: Secondary | ICD-10-CM | POA: Insufficient documentation

## 2017-02-20 DIAGNOSIS — M25561 Pain in right knee: Secondary | ICD-10-CM | POA: Diagnosis not present

## 2017-02-20 DIAGNOSIS — Z87891 Personal history of nicotine dependence: Secondary | ICD-10-CM | POA: Insufficient documentation

## 2017-02-20 DIAGNOSIS — R2241 Localized swelling, mass and lump, right lower limb: Secondary | ICD-10-CM | POA: Insufficient documentation

## 2017-02-20 DIAGNOSIS — Z7982 Long term (current) use of aspirin: Secondary | ICD-10-CM | POA: Insufficient documentation

## 2017-02-20 DIAGNOSIS — I10 Essential (primary) hypertension: Secondary | ICD-10-CM | POA: Diagnosis not present

## 2017-02-20 MED ORDER — IBUPROFEN 400 MG PO TABS
400.0000 mg | ORAL_TABLET | Freq: Once | ORAL | Status: AC
  Administered 2017-02-20: 400 mg via ORAL
  Filled 2017-02-20: qty 1

## 2017-02-20 MED ORDER — OXYCODONE-ACETAMINOPHEN 5-325 MG PO TABS
1.0000 | ORAL_TABLET | Freq: Once | ORAL | Status: AC
  Administered 2017-02-20: 1 via ORAL
  Filled 2017-02-20: qty 1

## 2017-02-20 NOTE — ED Provider Notes (Signed)
[facility] EMERGENCY DEPARTMENT Provider Note   CSN: [id number] Arrival date & time: 02/20/17  1300     History   Chief Complaint Chief Complaint  Patient presents with  . Knee Pain    HPI Marilyn Sexton is a 71 y.o. female.  HPI   71 year old female with right knee pain.  Chronic.  She had right knee arthroscopy and debridement on October 10.  She reports persistent pain and swelling since then.  Pain waxes and wanes but is constant.  Sometimes to the point where she has difficulty bearing weight.  No rash.  Denies any past history of DVT/PE.  She states that she is in the process of establishing care with another orthopedic group.  She denies any acute trauma.  Past Medical History:  Diagnosis Date  . Diabetes mellitus without complication (HCC)   . Dyspnea   . Hypertension   . Myocardial infarction (HCC)   . Thyroid disease     Patient Active Problem List   Diagnosis Date Noted  . Old complex tear of lateral meniscus of right knee   . Loose body in knee, right knee   . Bipolar affective disorder, manic, moderate (HCC) 07/23/2014  . History of medication noncompliance   . Manic behavior (HCC)     Past Surgical History:  Procedure Laterality Date  . CARDIAC CATHETERIZATION    . CHOLECYSTECTOMY    . coronary stents    . EYE SURGERY     bilateral cataracts  . KNEE ARTHROSCOPY Right 12/16/2016   Procedure: RIGHT KNEE ARTHROSCOPY AND DEBRIDEMENT;  Surgeon: Nadara Mustard, MD;  Location: [facility] OR;  Service: Orthopedics;  Laterality: Right;  . TUBAL LIGATION      OB History    No data available       Home Medications    Prior to Admission medications   Medication Sig Start Date End Date Taking? Authorizing Provider  amLODipine (NORVASC) 5 MG tablet Take 5 mg by mouth daily.   Yes [provider]  aspirin EC 81 MG tablet Take 81 mg by mouth every evening.   Yes [provider]  benazepril (LOTENSIN) 10 MG tablet Take 1  tablet (10 mg total) by mouth daily. Patient taking differently: Take 20 mg by mouth 2 (two) times daily.  07/23/14  Yes Charm Rings, NP  benztropine (COGENTIN) 0.5 MG tablet Take 1 tablet (0.5 mg total) by mouth 2 (two) times daily. 07/23/14  Yes Charm Rings, NP  ibuprofen (ADVIL) 200 MG tablet Take 400 mg by mouth every 6 (six) hours as needed.   Yes [provider]  insulin lispro (HUMALOG) 100 UNIT/ML injection Inject 12-14 Units into the skin See admin instructions. Inject 12 units SQ in the morning and inject 14 units SQ at night    Yes [provider]  levothyroxine (SYNTHROID, LEVOTHROID) 50 MCG tablet Take 50 mcg by mouth daily before breakfast.   Yes [provider]  metFORMIN (GLUCOPHAGE) 1000 MG tablet Take 1,000 mg by mouth daily.    Yes [provider]  metoprolol tartrate (LOPRESSOR) 25 MG tablet Take 1 tablet (25 mg total) by mouth daily. Patient taking differently: Take 50 mg by mouth 2 (two) times daily.  07/23/14  Yes Charm Rings, NP  mirabegron ER (MYRBETRIQ) 50 MG TB24 tablet Take 50 mg by mouth daily.   Yes [provider]  Propylene Glycol (SYSTANE BALANCE) 0.6 % SOLN Place 2 drops into both eyes  as needed (for dry eyes).   Yes [provider]  risperiDONE (RISPERDAL) 0.5 MG tablet Take 1 tablet (0.5 mg total) by mouth at bedtime. Patient taking differently: Take 1 mg by mouth 2 (two) times daily.  07/23/14  Yes Charm RingsLord, Jamison Y, NP  HYDROcodone-acetaminophen (NORCO/VICODIN) 5-325 MG tablet Take 1 tablet by mouth every 4 (four) hours as needed for moderate pain. Patient not taking: Reported on 02/20/2017 12/16/16   Nadara Mustarduda, Marcus V, MD    Family History History reviewed. No pertinent family history.  Social History Social History   Tobacco Use  . Smoking status: Former Smoker    Packs/day: 1.00    Years: 44.00    Pack years: 44.00    Types: Cigarettes    Last attempt to quit: 2006    Years since quitting:  12.9  . Smokeless tobacco: Never Used  Substance Use Topics  . Alcohol use: No  . Drug use: No     Allergies   Patient has no known allergies.   Review of Systems Review of Systems  All systems reviewed and negative, other than as noted in HPI.  Physical Exam Updated Vital Signs BP (!) 216/18 (BP Location: Left Arm)   Pulse 85   Temp 98.6 F (37 C) (Oral)   Resp 17   Ht 5\' 6"  (1.676 m)   Wt 99.8 kg (220 lb)   SpO2 98%   BMI 35.51 kg/m   Physical Exam  Constitutional: She appears well-developed and well-nourished. No distress.  HENT:  Head: Normocephalic and atraumatic.  Eyes: Conjunctivae are normal. Right eye exhibits no discharge. Left eye exhibits no discharge.  Neck: Neck supple.  Cardiovascular: Normal rate, regular rhythm and normal heart sounds. Exam reveals no gallop and no friction rub.  No murmur heard. Pulmonary/Chest: Effort normal and breath sounds normal. No respiratory distress.  Abdominal: Soft. She exhibits no distension. There is no tenderness.  Musculoskeletal: She exhibits no edema or tenderness.  Right knee with swelling as compared to the left.  Perhaps some mild effusion.  Mild tenderness anteriorly and over the lateral aspect.  Can actively range without apparent discomfort.  No rash.  Neurovascular intact distally.  Neurological: She is alert.  Skin: Skin is warm and dry.  Psychiatric: She has a normal mood and affect. Her behavior is normal. Thought content normal.  Nursing note and vitals reviewed.    ED Treatments / Results  Labs (all labs ordered are listed, but only abnormal results are displayed) Labs Reviewed - No data to display  EKG  EKG Interpretation None       Radiology Dg Knee Complete 4 Views Right  Result Date: 02/20/2017 CLINICAL DATA:  Right leg pain, swelling EXAM: RIGHT KNEE - COMPLETE 4+ VIEW COMPARISON:  10/17/2016 FINDINGS: Mild tricompartment degenerative changes with joint space narrowing and spurring.  No acute bony abnormality. Specifically, no fracture, subluxation, or dislocation. Soft tissues are intact. No joint effusion. IMPRESSION: Mild degenerative changes.  No acute findings. Electronically Signed   By: Charlett NoseKevin  Dover M.D.   On: 02/20/2017 17:21    Procedures Procedures (including critical care time)  Medications Ordered in ED Medications  oxyCODONE-acetaminophen (PERCOCET/ROXICET) 5-325 MG per tablet 1 tablet (1 tablet Oral Given 02/20/17 1648)  ibuprofen (ADVIL,MOTRIN) tablet 400 mg (400 mg Oral Given 02/20/17 1648)     Initial Impression / Assessment and Plan / ED Course  I have reviewed the triage vital signs and the nursing notes.  Pertinent labs & imaging results  that were available during my care of the patient were reviewed by me and considered in my medical decision making (see chart for details).     Chronic right knee pain.  No acute trauma.  Neurovascularly intact.  Ultrasound negative for DVT.  Continue symptomatic treatment.  Orthopedic follow-up otherwise.  Final Clinical Impressions(s) / ED Diagnoses   Final diagnoses:  Chronic pain of right knee    ED Discharge Orders    None       Raeford RazorKohut, Mariely Mahr, MD 02/20/17 1816

## 2017-02-20 NOTE — Progress Notes (Signed)
VASCULAR LAB PRELIMINARY  PRELIMINARY  PRELIMINARY  PRELIMINARY  Right lower extremity venous duplex completed.    Preliminary report:  There is no DVT or SVT noted in the right lower extremity.  There is an area of mixed echoes noted in the right popliteal fossa, extending into the proximal calf, that may or may not be consistent with rupturing Baker's cyst.   Keysha Damewood, RVT 02/20/2017, 5:32 PM

## 2017-02-20 NOTE — ED Triage Notes (Signed)
Pt was released from ortho MD in October from right knee surgery.  Pt has had continued pain, knee popping, and still having to use walker.  Pt has an appt in Jan with another ortho MD but is unable to wait that long.

## 2017-02-20 NOTE — Discharge Instructions (Signed)
Your x-ray shows degenerative changes in your knee.  Your ultrasound is negative for blood clot.  You need to follow-up with the orthopedist to discuss your symptoms further.

## 2017-05-27 ENCOUNTER — Other Ambulatory Visit: Payer: Self-pay | Admitting: Orthopedic Surgery

## 2017-05-27 DIAGNOSIS — S83241A Other tear of medial meniscus, current injury, right knee, initial encounter: Secondary | ICD-10-CM

## 2017-05-27 DIAGNOSIS — M5416 Radiculopathy, lumbar region: Secondary | ICD-10-CM

## 2017-06-08 ENCOUNTER — Ambulatory Visit
Admission: RE | Admit: 2017-06-08 | Discharge: 2017-06-08 | Disposition: A | Discharge status: Home / Self Care | Payer: Medicare Other | Source: Ambulatory Visit | Attending: Orthopedic Surgery | Admitting: Orthopedic Surgery

## 2017-06-08 DIAGNOSIS — S83241A Other tear of medial meniscus, current injury, right knee, initial encounter: Secondary | ICD-10-CM

## 2017-06-08 DIAGNOSIS — M5416 Radiculopathy, lumbar region: Secondary | ICD-10-CM

## 2017-08-15 ENCOUNTER — Emergency Department (HOSPITAL_COMMUNITY): Payer: Medicare Other

## 2017-08-15 ENCOUNTER — Inpatient Hospital Stay (HOSPITAL_COMMUNITY)
Admission: EM | Admit: 2017-08-15 | Discharge: 2017-08-16 | DRG: 303 | Disposition: A | Discharge status: Home / Self Care | Payer: Medicare Other | Attending: Cardiology | Admitting: Cardiology

## 2017-08-15 ENCOUNTER — Other Ambulatory Visit: Payer: Self-pay

## 2017-08-15 ENCOUNTER — Encounter (HOSPITAL_COMMUNITY): Payer: Self-pay | Admitting: Emergency Medicine

## 2017-08-15 DIAGNOSIS — I10 Essential (primary) hypertension: Secondary | ICD-10-CM | POA: Diagnosis present

## 2017-08-15 DIAGNOSIS — I160 Hypertensive urgency: Secondary | ICD-10-CM

## 2017-08-15 DIAGNOSIS — E039 Hypothyroidism, unspecified: Secondary | ICD-10-CM | POA: Diagnosis present

## 2017-08-15 DIAGNOSIS — E785 Hyperlipidemia, unspecified: Secondary | ICD-10-CM | POA: Diagnosis present

## 2017-08-15 DIAGNOSIS — I25119 Atherosclerotic heart disease of native coronary artery with unspecified angina pectoris: Secondary | ICD-10-CM | POA: Diagnosis present

## 2017-08-15 DIAGNOSIS — E119 Type 2 diabetes mellitus without complications: Secondary | ICD-10-CM | POA: Diagnosis present

## 2017-08-15 DIAGNOSIS — Z955 Presence of coronary angioplasty implant and graft: Secondary | ICD-10-CM

## 2017-08-15 DIAGNOSIS — I4510 Unspecified right bundle-branch block: Secondary | ICD-10-CM | POA: Diagnosis present

## 2017-08-15 DIAGNOSIS — E871 Hypo-osmolality and hyponatremia: Secondary | ICD-10-CM | POA: Diagnosis present

## 2017-08-15 DIAGNOSIS — F329 Major depressive disorder, single episode, unspecified: Secondary | ICD-10-CM | POA: Diagnosis present

## 2017-08-15 DIAGNOSIS — R0609 Other forms of dyspnea: Secondary | ICD-10-CM | POA: Diagnosis present

## 2017-08-15 DIAGNOSIS — Z7982 Long term (current) use of aspirin: Secondary | ICD-10-CM | POA: Diagnosis not present

## 2017-08-15 DIAGNOSIS — Z79899 Other long term (current) drug therapy: Secondary | ICD-10-CM

## 2017-08-15 DIAGNOSIS — Z6837 Body mass index (BMI) 37.0-37.9, adult: Secondary | ICD-10-CM

## 2017-08-15 DIAGNOSIS — Z86711 Personal history of pulmonary embolism: Secondary | ICD-10-CM

## 2017-08-15 DIAGNOSIS — I252 Old myocardial infarction: Secondary | ICD-10-CM

## 2017-08-15 DIAGNOSIS — R0602 Shortness of breath: Secondary | ICD-10-CM | POA: Diagnosis present

## 2017-08-15 DIAGNOSIS — I249 Acute ischemic heart disease, unspecified: Secondary | ICD-10-CM | POA: Diagnosis present

## 2017-08-15 DIAGNOSIS — Z794 Long term (current) use of insulin: Secondary | ICD-10-CM | POA: Diagnosis not present

## 2017-08-15 DIAGNOSIS — Z87891 Personal history of nicotine dependence: Secondary | ICD-10-CM

## 2017-08-15 LAB — BASIC METABOLIC PANEL
Anion gap: 10 (ref 5–15)
BUN: 10 mg/dL (ref 6–20)
CO2: 26 mmol/L (ref 22–32)
Calcium: 9.3 mg/dL (ref 8.9–10.3)
Chloride: 93 mmol/L — ABNORMAL LOW (ref 101–111)
Creatinine, Ser: 1.01 mg/dL — ABNORMAL HIGH (ref 0.44–1.00)
GFR, EST NON AFRICAN AMERICAN: 55 mL/min — AB (ref >60)
Glucose, Bld: 190 mg/dL — ABNORMAL HIGH (ref 65–99)
Potassium: 3.9 mmol/L (ref 3.5–5.1)
Sodium: 129 mmol/L — ABNORMAL LOW (ref 135–145)

## 2017-08-15 LAB — CBC
HCT: 32 % — ABNORMAL LOW (ref 36.0–46.0)
HEMOGLOBIN: 10.9 g/dL — AB (ref 12.0–15.0)
MCH: 33.3 pg (ref 26.0–34.0)
MCHC: 34.1 g/dL (ref 30.0–36.0)
MCV: 97.9 fL (ref 78.0–100.0)
Platelets: 288 10*3/uL (ref 150–400)
RBC: 3.27 MIL/uL — AB (ref 3.87–5.11)
RDW: 15 % (ref 11.5–15.5)
WBC: 10.2 10*3/uL (ref 4.0–10.5)

## 2017-08-15 LAB — GLUCOSE, CAPILLARY: Glucose-Capillary: 111 mg/dL — ABNORMAL HIGH (ref 65–99)

## 2017-08-15 LAB — I-STAT TROPONIN, ED: Troponin i, poc: 0.03 ng/mL (ref 0.00–0.08)

## 2017-08-15 LAB — D-DIMER, QUANTITATIVE: D-Dimer, Quant: 0.38 ug/mL-FEU (ref 0.00–0.50)

## 2017-08-15 MED ORDER — ATORVASTATIN CALCIUM 40 MG PO TABS
40.0000 mg | ORAL_TABLET | Freq: Every day | ORAL | Status: DC
  Administered 2017-08-16: 40 mg via ORAL
  Filled 2017-08-15: qty 1

## 2017-08-15 MED ORDER — HEPARIN (PORCINE) IN NACL 100-0.45 UNIT/ML-% IJ SOLN
1350.0000 [IU]/h | INTRAMUSCULAR | Status: DC
  Administered 2017-08-15: 950 [IU]/h via INTRAVENOUS
  Filled 2017-08-15 (×2): qty 250

## 2017-08-15 MED ORDER — METOPROLOL TARTRATE 25 MG PO TABS: 25.0000 mg | ORAL_TABLET | Freq: Every day | ORAL | Status: DC

## 2017-08-15 MED ORDER — INSULIN GLARGINE 100 UNIT/ML ~~LOC~~ SOLN
5.0000 [IU] | Freq: Every day | SUBCUTANEOUS | Status: DC
  Administered 2017-08-16: 5 [IU] via SUBCUTANEOUS
  Filled 2017-08-15 (×2): qty 0.05

## 2017-08-15 MED ORDER — NITROGLYCERIN IN D5W 200-5 MCG/ML-% IV SOLN
0.0000 ug/min | Freq: Once | INTRAVENOUS | Status: AC
  Administered 2017-08-15: 5 ug/min via INTRAVENOUS
  Filled 2017-08-15: qty 250

## 2017-08-15 MED ORDER — NITROGLYCERIN IN D5W 200-5 MCG/ML-% IV SOLN
10.0000 ug/min | INTRAVENOUS | Status: DC
  Administered 2017-08-16: 10 ug/min via INTRAVENOUS

## 2017-08-15 MED ORDER — AMLODIPINE BESYLATE 5 MG PO TABS: 5.0000 mg | ORAL_TABLET | Freq: Every day | ORAL | Status: DC

## 2017-08-15 MED ORDER — BENZTROPINE MESYLATE 0.5 MG PO TABS
0.5000 mg | ORAL_TABLET | Freq: Two times a day (BID) | ORAL | Status: DC
  Administered 2017-08-16 (×2): 0.5 mg via ORAL
  Filled 2017-08-15 (×2): qty 1

## 2017-08-15 MED ORDER — ASPIRIN 300 MG RE SUPP: 300.0000 mg | RECTAL | Status: DC

## 2017-08-15 MED ORDER — NITROGLYCERIN 0.4 MG SL SUBL: 0.4000 mg | SUBLINGUAL_TABLET | SUBLINGUAL | Status: DC | PRN

## 2017-08-15 MED ORDER — MIRABEGRON ER 25 MG PO TB24
25.0000 mg | ORAL_TABLET | Freq: Every day | ORAL | Status: DC
  Administered 2017-08-16: 25 mg via ORAL
  Filled 2017-08-15: qty 1

## 2017-08-15 MED ORDER — RISPERIDONE 0.5 MG PO TABS
0.5000 mg | ORAL_TABLET | Freq: Every day | ORAL | Status: DC
  Administered 2017-08-16: 0.5 mg via ORAL
  Filled 2017-08-15 (×2): qty 1

## 2017-08-15 MED ORDER — HEPARIN BOLUS VIA INFUSION
4000.0000 [IU] | Freq: Once | INTRAVENOUS | Status: AC
  Administered 2017-08-15: 4000 [IU] via INTRAVENOUS
  Filled 2017-08-15: qty 4000

## 2017-08-15 MED ORDER — ACETAMINOPHEN 325 MG PO TABS
650.0000 mg | ORAL_TABLET | ORAL | Status: DC | PRN
  Administered 2017-08-16 (×3): 650 mg via ORAL
  Filled 2017-08-15 (×3): qty 2

## 2017-08-15 MED ORDER — INSULIN ASPART 100 UNIT/ML ~~LOC~~ SOLN
0.0000 [IU] | Freq: Three times a day (TID) | SUBCUTANEOUS | Status: DC
  Administered 2017-08-16 (×2): 2 [IU] via SUBCUTANEOUS

## 2017-08-15 MED ORDER — ONDANSETRON HCL 4 MG/2ML IJ SOLN: 4.0000 mg | Freq: Four times a day (QID) | INTRAMUSCULAR | Status: DC | PRN

## 2017-08-15 MED ORDER — ASPIRIN 81 MG PO CHEW: 324.0000 mg | CHEWABLE_TABLET | ORAL | Status: DC

## 2017-08-15 MED ORDER — HYDRALAZINE HCL 20 MG/ML IJ SOLN
10.0000 mg | Freq: Once | INTRAMUSCULAR | Status: AC
  Administered 2017-08-15: 10 mg via INTRAVENOUS
  Filled 2017-08-15: qty 1

## 2017-08-15 MED ORDER — ASPIRIN 81 MG PO CHEW
243.0000 mg | CHEWABLE_TABLET | Freq: Once | ORAL | Status: AC
  Administered 2017-08-15: 243 mg via ORAL
  Filled 2017-08-15: qty 3

## 2017-08-15 MED ORDER — ASPIRIN EC 81 MG PO TBEC
81.0000 mg | DELAYED_RELEASE_TABLET | Freq: Every day | ORAL | Status: DC
  Administered 2017-08-16: 81 mg via ORAL
  Filled 2017-08-15: qty 1

## 2017-08-15 MED ORDER — BENAZEPRIL HCL 20 MG PO TABS
10.0000 mg | ORAL_TABLET | Freq: Every day | ORAL | Status: DC
  Administered 2017-08-16: 10 mg via ORAL
  Filled 2017-08-15: qty 1

## 2017-08-15 MED ORDER — SODIUM CHLORIDE 0.9 % IV SOLN
INTRAVENOUS | Status: DC
  Administered 2017-08-16: via INTRAVENOUS

## 2017-08-15 MED ORDER — LEVOTHYROXINE SODIUM 50 MCG PO TABS
50.0000 ug | ORAL_TABLET | Freq: Every day | ORAL | Status: DC
  Administered 2017-08-16: 50 ug via ORAL
  Filled 2017-08-15: qty 1

## 2017-08-15 NOTE — ED Provider Notes (Signed)
[facility] EMERGENCY DEPARTMENT Provider Note   CSN: [id number] Arrival date & time: 08/15/17  1918     History   Chief Complaint Chief Complaint  Patient presents with  . Shortness of Breath    HPI Baron SaneBonnie F Lemoine is a 72 y.o. female hx of diabetes, MI s/p stents, HTN, here with shortness of breath.  Patient states that she has been having some shortness of breath for the last 2 days.  Patient states that it is worse with exertion and is worse at night.  She had some chest pressure occasionally and she states that this is similar to her previous heart attacks.  She did take one baby ASA today. She follows up with Dr. Sharyn LullHarwani and had MI 10 years ago and had myoview done on 2013 but no recent imaging or stress test. Denies any recent travel or leg swelling or calf pain   The history is provided by the patient.    Past Medical History:  Diagnosis Date  . Diabetes mellitus without complication (HCC)   . Dyspnea   . Hypertension   . Myocardial infarction (HCC)   . Thyroid disease     Patient Active Problem List   Diagnosis Date Noted  . Old complex tear of lateral meniscus of right knee   . Loose body in knee, right knee   . Bipolar affective disorder, manic, moderate (HCC) 07/23/2014  . History of medication noncompliance   . Manic behavior (HCC)     Past Surgical History:  Procedure Laterality Date  . CARDIAC CATHETERIZATION    . CHOLECYSTECTOMY    . coronary stents    . EYE SURGERY     bilateral cataracts  . KNEE ARTHROSCOPY Right 12/16/2016   Procedure: RIGHT KNEE ARTHROSCOPY AND DEBRIDEMENT;  Surgeon: Nadara Mustarduda, Marcus V, MD;  [facility] OR;  Service: Orthopedics;  Laterality: Right;  . TUBAL LIGATION       OB History   None      Home Medications    Prior to Admission medications   Medication Sig Start Date End Date Taking? Authorizing Provider  amLODipine (NORVASC) 5 MG tablet Take 5 mg by mouth daily.    [provider]    aspirin EC 81 MG tablet Take 81 mg by mouth every evening.    [provider]  benazepril (LOTENSIN) 10 MG tablet Take 1 tablet (10 mg total) by mouth daily. Patient taking differently: Take 20 mg by mouth 2 (two) times daily.  07/23/14   Charm RingsLord, Jamison Y, NP  benztropine (COGENTIN) 0.5 MG tablet Take 1 tablet (0.5 mg total) by mouth 2 (two) times daily. 07/23/14   Charm RingsLord, Jamison Y, NP  HYDROcodone-acetaminophen (NORCO/VICODIN) 5-325 MG tablet Take 1 tablet by mouth every 4 (four) hours as needed for moderate pain. Patient not taking: Reported on 02/20/2017 12/16/16   Nadara Mustarduda, Marcus V, MD  ibuprofen (ADVIL) 200 MG tablet Take 400 mg by mouth every 6 (six) hours as needed.    [provider]  insulin lispro (HUMALOG) 100 UNIT/ML injection Inject 12-14 Units into the skin See admin instructions. Inject 12 units SQ in the morning and inject 14 units SQ at night     [provider]  levothyroxine (SYNTHROID, LEVOTHROID) 50 MCG tablet Take 50 mcg by mouth daily before breakfast.    [provider]  metFORMIN (GLUCOPHAGE) 1000 MG tablet Take 1,000 mg by mouth daily.     [provider]  metoprolol tartrate (LOPRESSOR) 25  MG tablet Take 1 tablet (25 mg total) by mouth daily. Patient taking differently: Take 50 mg by mouth 2 (two) times daily.  07/23/14   Charm Rings, NP  mirabegron ER (MYRBETRIQ) 50 MG TB24 tablet Take 50 mg by mouth daily.    [provider]  Propylene Glycol (SYSTANE BALANCE) 0.6 % SOLN Place 2 drops into both eyes as needed (for dry eyes).    [provider]  risperiDONE (RISPERDAL) 0.5 MG tablet Take 1 tablet (0.5 mg total) by mouth at bedtime. Patient taking differently: Take 1 mg by mouth 2 (two) times daily.  07/23/14   Charm Rings, NP    Family History No family history on file.  Social History Social History   Tobacco Use  . Smoking status: Former Smoker    Packs/day: 1.00    Years: 44.00    Pack years:  44.00    Types: Cigarettes    Last attempt to quit: 2006    Years since quitting: 13.4  . Smokeless tobacco: Never Used  Substance Use Topics  . Alcohol use: No  . Drug use: No     Allergies   Patient has no known allergies.   Review of Systems Review of Systems  Respiratory: Positive for shortness of breath.   All other systems reviewed and are negative.    Physical Exam Updated Vital Signs BP (!) 186/80   Pulse 77   Temp (!) 97.5 F (36.4 C) (Oral)   Resp (!) 26   Ht 5\' 4"  (1.626 m)   Wt 98.4 kg (217 lb)   SpO2 94%   BMI 37.25 kg/m   Physical Exam  Constitutional: She is oriented to person, place, and time.  Slightly short of breath   HENT:  Head: Normocephalic.  Mouth/Throat: Oropharynx is clear and moist.  Eyes: Pupils are equal, round, and reactive to light. EOM are normal.  Neck: Normal range of motion.  Cardiovascular: Normal rate.  Pulmonary/Chest: Effort normal.  No wheezing or crackles   Abdominal: Soft. Bowel sounds are normal.  Musculoskeletal: Normal range of motion.       Right lower leg: Normal.       Left lower leg: Normal.  Neurological: She is alert and oriented to person, place, and time.  Skin: Skin is warm. Capillary refill takes less than 2 seconds.  Psychiatric: She has a normal mood and affect. Her behavior is normal.  Nursing note and vitals reviewed.    ED Treatments / Results  Labs (all labs ordered are listed, but only abnormal results are displayed) Labs Reviewed  BASIC METABOLIC PANEL - Abnormal; Notable for the following components:      Result Value   Sodium 129 (*)    Chloride 93 (*)    Glucose, Bld 190 (*)    Creatinine, Ser 1.01 (*)    GFR calc non Af Amer 55 (*)    All other components within normal limits  CBC - Abnormal; Notable for the following components:   RBC 3.27 (*)    Hemoglobin 10.9 (*)    HCT 32.0 (*)    All other components within normal limits  I-STAT TROPONIN, ED    EKG EKG  Interpretation  Date/Time:  Sunday August 15 2017 19:26:54 EDT Ventricular Rate:  85 PR Interval:  178 QRS Duration: 104 QT Interval:  372 QTC Calculation: 442 R Axis:   44 Text Interpretation:  Normal sinus rhythm Low voltage QRS Incomplete right bundle branch block Possible  Inferior infarct , age undetermined Cannot rule out Anterior infarct , age undetermined Abnormal ECG No significant change since last tracing Confirmed by Richardean Canal ([id number]) on 08/15/2017 7:52:14 PM   Radiology Dg Chest 2 View  Result Date: 08/15/2017 CLINICAL DATA:  72 y/o F; shortness of breath starting 3 days ago. Dry nonproductive cough. Diarrhea. EXAM: CHEST - 2 VIEW COMPARISON:  06/07/2007 chest radiograph. FINDINGS: Normal cardiac silhouette. Aortic atherosclerosis with calcification. Clear lungs. No pleural effusion or pneumothorax. No acute osseous abnormality is evident. IMPRESSION: No acute pulmonary process identified. Electronically Signed   By: Mitzi Hansen M.D.   On: 08/15/2017 19:56    Procedures Procedures (including critical care time)  CRITICAL CARE Performed by: Richardean Canal   Total critical care time: 30 minutes  Critical care time was exclusive of separately billable procedures and treating other patients.  Critical care was necessary to treat or prevent imminent or life-threatening deterioration.  Critical care was time spent personally by me on the following activities: development of treatment plan with patient and/or surrogate as well as nursing, discussions with consultants, evaluation of patient's response to treatment, examination of patient, obtaining history from patient or surrogate, ordering and performing treatments and interventions, ordering and review of laboratory studies, ordering and review of radiographic studies, pulse oximetry and re-evaluation of patient's condition.  Medications Ordered in ED Medications  nitroGLYCERIN 50 mg in dextrose 5 % 250 mL (0.2  mg/mL) infusion (has no administration in time range)  hydrALAZINE (APRESOLINE) injection 10 mg (10 mg Intravenous Given 08/15/17 2036)  aspirin chewable tablet 243 mg (243 mg Oral Given 08/15/17 2038)     Initial Impression / Assessment and Plan / ED Course  I have reviewed the triage vital signs and the nursing notes.  Pertinent labs & imaging results that were available during my care of the patient were reviewed by me and considered in my medical decision making (see chart for details).     KAMALJIT HIZER is a 72 y.o. female here with SOB, chest pressure. She is hypertensive but has hx of hypertension. SOB worse with exertion and she had previous MI. Consider MI vs hypertensive urgency. Low suspicion for dissection. Will get labs, CXR. Will consult Dr. Sharyn Lull regarding admission for r/o ACS.   8:46 PM I called Dr. Sharyn Lull. He recommend nitro drip to control BP and heparin drip. He will see and admit patient.    Final Clinical Impressions(s) / ED Diagnoses   Final diagnoses:  None    ED Discharge Orders    None       Charlynne Pander, MD 08/15/17 2047

## 2017-08-15 NOTE — Progress Notes (Signed)
Patient here via stretcher. Alert and oriented x 4. Ambulatory. Oriented to room and call light. On Nitro gtt at 8710mcg/min, on heparin gtt at 9.455ml/hr, on normal saline at 5910ml/hr via left AC. No c/o pain. Skin audit performed by myself and Cindra EvesHeather Richard RN. No skin breakdown or rashes noted. VSS. CHG bath given. BSC placed at bedside. Husband at bedside. No distress noted at this time. On room air.

## 2017-08-15 NOTE — ED Notes (Signed)
Dr Sharyn LullHarwani called back and stated to keep nitro drip at 10, NOT to titrate, and give oral BP meds

## 2017-08-15 NOTE — ED Notes (Signed)
BP goal is systolic of 140 per Dr Silverio Layyao

## 2017-08-15 NOTE — H&P (Signed)
Marilyn Sexton is an 72 y.o. female.   Chief Complaint:progressive shortness of breath for last 3 days HPI: patient is 72 year old female with past medical history significant for coronary artery disease history of non-Q-wave myocardial infarction approximately 10 years ago status post PTCA stenting to RCA, hypertension, non-insulin-dependent diabetes mellitus, hypothyroidism, hypercholesterolemia, morbid obesity, depression, came to the ER complaining of progressive increasing shortness of breath for last 3 days with minimal exertion. Patient denies any chest pain but activity very limited. Denies excessive salty food intake denies any leg swelling. Denies PND orthopnea leg swelling. Denies prolonged immobilization. EKG done in the ED showed normal sinus rhythm with incomplete right bundle branch block and old inferior wall MI as before no new acute ischemic changes were noted first set of cardiac enzymes has been negative chest x-ray showed no acute cardiopulmonary abnormalities. Patient denies any fever chills cough or cold.  Past Medical History:  Diagnosis Date  . Diabetes mellitus without complication ([place])   . Dyspnea   . Hypertension   . Myocardial infarction ([place])   . Thyroid disease     Past Surgical History:  Procedure Laterality Date  . CARDIAC CATHETERIZATION    . CHOLECYSTECTOMY    . coronary stents    . EYE SURGERY     bilateral cataracts  . KNEE ARTHROSCOPY Right 12/16/2016   Procedure: RIGHT KNEE ARTHROSCOPY AND DEBRIDEMENT;  Surgeon: Newt Minion, MD;  Location: [place];  Service: Orthopedics;  Laterality: Right;  . TUBAL LIGATION      No family history on file. Social History:  reports that she quit smoking about 13 years ago. Her smoking use included cigarettes. She has a 44.00 pack-year smoking history. She has never used smokeless tobacco. She reports that she does not drink alcohol or use drugs.  Allergies: No Known Allergies   (Not in a hospital  admission)  Results for orders placed or performed during the hospital encounter of 08/15/17 (from the past 48 hour(s))  Basic metabolic panel     Status: Abnormal   Collection Time: 08/15/17  7:32 PM  Result Value Ref Range   Sodium 129 (L) 135 - 145 mmol/L   Potassium 3.9 3.5 - 5.1 mmol/L   Chloride 93 (L) 101 - 111 mmol/L   CO2 26 22 - 32 mmol/L   Glucose, Bld 190 (H) 65 - 99 mg/dL   BUN 10 6 - 20 mg/dL   Creatinine, Ser 1.01 (H) 0.44 - 1.00 mg/dL   Calcium 9.3 8.9 - 10.3 mg/dL   GFR calc non Af Amer 55 (L) >60 mL/min   GFR calc Af Amer >60 >60 mL/min    Comment: (NOTE) The eGFR has been calculated using the CKD EPI equation. This calculation has not been validated in all clinical situations. eGFR's persistently <60 mL/min signify possible Chronic Kidney Disease.    Anion gap 10 5 - 15    Comment: Performed at [facility] [address], [place], [place] [address]  CBC     Status: Abnormal   Collection Time: 08/15/17  7:32 PM  Result Value Ref Range   WBC 10.2 4.0 - 10.5 K/uL   RBC 3.27 (L) 3.87 - 5.11 MIL/uL   Hemoglobin 10.9 (L) 12.0 - 15.0 g/dL   HCT 32.0 (L) 36.0 - 46.0 %   MCV 97.9 78.0 - 100.0 fL   MCH 33.3 26.0 - 34.0 pg   MCHC 34.1 30.0 - 36.0 g/dL   RDW 15.0 11.5 - 15.5 %  Platelets 288 150 - 400 K/uL    Comment: Performed at [facility], [place] [address] [place], [place] [address]  I-stat troponin, ED     Status: None   Collection Time: 08/15/17  7:38 PM  Result Value Ref Range   Troponin i, poc 0.03 0.00 - 0.08 ng/mL   Comment 3            Comment: Due to the release kinetics of cTnI, a negative result within the first hours of the onset of symptoms does not rule out myocardial infarction with certainty. If myocardial infarction is still suspected, repeat the test at appropriate intervals.    Dg Chest 2 View  Result Date: 08/15/2017 CLINICAL DATA:  72 y/o F; shortness of breath starting 3 days ago. Dry nonproductive cough. Diarrhea.  EXAM: CHEST - 2 VIEW COMPARISON:  06/07/2007 chest radiograph. FINDINGS: Normal cardiac silhouette. Aortic atherosclerosis with calcification. Clear lungs. No pleural effusion or pneumothorax. No acute osseous abnormality is evident. IMPRESSION: No acute pulmonary process identified. Electronically Signed   By: Kristine Garbe M.D.   On: 08/15/2017 19:56    Review of Systems  Constitutional: Negative for chills and fever.  HENT: Negative for hearing loss.   Eyes: Negative for blurred vision.  Respiratory: Positive for shortness of breath. Negative for cough.   Cardiovascular: Negative for chest pain, palpitations, orthopnea and leg swelling.  Gastrointestinal: Negative for abdominal pain, nausea and vomiting.  Genitourinary: Negative for dysuria.  Neurological: Negative for dizziness.    Blood pressure (!) 172/81, pulse 83, temperature (!) 97.5 F (36.4 C), temperature source Oral, resp. rate (!) 23, height _0  (1.626 m), weight 98.4 kg (217 lb), SpO2 95 %. Physical Exam  Constitutional: She is oriented to person, place, and time.  HENT:  Head: Normocephalic and atraumatic.  Eyes: Pupils are equal, round, and reactive to light. Conjunctivae are normal. Left eye exhibits no discharge. No scleral icterus.  Neck: Normal range of motion. Neck supple. No JVD present. No tracheal deviation present. No thyromegaly present.  Cardiovascular: Normal rate and regular rhythm.  Murmur (soft systolic murmur noted) heard. Respiratory: Effort normal and breath sounds normal. No respiratory distress. She has no wheezes. She has no rales.  GI: Soft. Bowel sounds are normal. She exhibits no distension. There is no tenderness. There is no rebound.  Musculoskeletal: She exhibits no edema, tenderness or deformity.  Neurological: She is alert and oriented to person, place, and time.     Assessment/Plan Exertional dyspnea probably angina equivalent rule out MI/rule out PE Coronary artery disease  history of inferior wall MI in the past status post PCI to RCA Uncontrolled hypertension Diabetes mellitus Hyperlipidemia Morbid obesity Hyponatremia questionable etiology Hypothyroidism Depression PLAN As per orders We will get D dimers if positive will get CT  do to rule out PE Schedule for Lexiscan Myoview in a.m. Check old records  Charolette Forward, MD 08/15/2017, 9:32 PM

## 2017-08-15 NOTE — ED Triage Notes (Signed)
Pt reports some sob that started about 3 days ago. Pt denies any pain. Pt reports diarrhea x1 that started yesterday and a dry, non-productive cough.

## 2017-08-15 NOTE — ED Notes (Signed)
Cards paged to change order from tele to SDU due to pt on nitro drip that is titrating

## 2017-08-15 NOTE — ED Notes (Signed)
Cards at bedside

## 2017-08-15 NOTE — Progress Notes (Signed)
ANTICOAGULATION CONSULT NOTE - Initial Consult  Pharmacy Consult for heparin  Indication: chest pain/ACS  No Known Allergies  Patient Measurements: Height: 5\' 4"  (162.6 cm) Weight: 217 lb (98.4 kg) IBW/kg (Calculated) : 54.7 Heparin Dosing Weight: 77kg  Vital Signs: Temp: 97.5 F (36.4 C) (06/09 1927) Temp Source: Oral (06/09 1927) BP: 186/80 (06/09 2036) Pulse Rate: 77 (06/09 2030)  Labs: Recent Labs    08/15/17 1932  HGB 10.9*  HCT 32.0*  PLT 288  CREATININE 1.01*    Estimated Creatinine Clearance: 59.1 mL/min (A) (by C-G formula based on SCr of 1.01 mg/dL (H)).   Medical History: Past Medical History:  Diagnosis Date  . Diabetes mellitus without complication (HCC)   . Dyspnea   . Hypertension   . Myocardial infarction (HCC)   . Thyroid disease     Assessment: 89 you female here with SOB/CP. Pharmacy consulted to dose heparin for r/o ACS. No anticoagulants noted PTA -Hg= 10.9, plt= 288   Goal of Therapy:  Heparin level 0.3-0.7 units/ml Monitor platelets by anticoagulation protocol: Yes   Plan:  -Heparin bolus 4000 units IV followed by  950 units/hr (~ 12 units/kg/hr) -Heparin level in 8 hours and daily wth CBC daily  Harland GermanAndrew Nakeshia Waldeck, PharmD 08/15/2017 8:56 PM

## 2017-08-16 ENCOUNTER — Inpatient Hospital Stay (HOSPITAL_COMMUNITY): Payer: Medicare Other

## 2017-08-16 DIAGNOSIS — I25119 Atherosclerotic heart disease of native coronary artery with unspecified angina pectoris: Secondary | ICD-10-CM | POA: Diagnosis not present

## 2017-08-16 DIAGNOSIS — R0602 Shortness of breath: Secondary | ICD-10-CM | POA: Diagnosis not present

## 2017-08-16 DIAGNOSIS — R0609 Other forms of dyspnea: Secondary | ICD-10-CM | POA: Diagnosis not present

## 2017-08-16 DIAGNOSIS — E871 Hypo-osmolality and hyponatremia: Secondary | ICD-10-CM | POA: Diagnosis not present

## 2017-08-16 LAB — CBC
HEMATOCRIT: 31 % — AB (ref 36.0–46.0)
Hemoglobin: 10.7 g/dL — ABNORMAL LOW (ref 12.0–15.0)
MCH: 33.6 pg (ref 26.0–34.0)
MCHC: 34.5 g/dL (ref 30.0–36.0)
MCV: 97.5 fL (ref 78.0–100.0)
PLATELETS: 275 10*3/uL (ref 150–400)
RBC: 3.18 MIL/uL — ABNORMAL LOW (ref 3.87–5.11)
RDW: 14.8 % (ref 11.5–15.5)
WBC: 11.1 10*3/uL — AB (ref 4.0–10.5)

## 2017-08-16 LAB — LIPID PANEL
CHOL/HDL RATIO: 3 ratio
Cholesterol: 123 mg/dL (ref 0–200)
HDL: 41 mg/dL (ref >40)
LDL CALC: 42 mg/dL (ref 0–99)
TRIGLYCERIDES: 201 mg/dL — AB (ref <150)
VLDL: 40 mg/dL (ref 0–40)

## 2017-08-16 LAB — BASIC METABOLIC PANEL
Anion gap: 10 (ref 5–15)
BUN: 8 mg/dL (ref 6–20)
CALCIUM: 9 mg/dL (ref 8.9–10.3)
CO2: 26 mmol/L (ref 22–32)
CREATININE: 0.88 mg/dL (ref 0.44–1.00)
Chloride: 96 mmol/L — ABNORMAL LOW (ref 101–111)
GFR calc non Af Amer: >60 mL/min (ref >60)
Glucose, Bld: 127 mg/dL — ABNORMAL HIGH (ref 65–99)
Potassium: 3.7 mmol/L (ref 3.5–5.1)
SODIUM: 132 mmol/L — AB (ref 135–145)

## 2017-08-16 LAB — TROPONIN I
Troponin I: <0.03 ng/mL (ref <0.03)
Troponin I: <0.03 ng/mL (ref <0.03)

## 2017-08-16 LAB — HEMOGLOBIN A1C
HEMOGLOBIN A1C: 6.1 % — AB (ref 4.8–5.6)
MEAN PLASMA GLUCOSE: 128.37 mg/dL

## 2017-08-16 LAB — TSH: TSH: 4.242 u[IU]/mL (ref 0.350–4.500)

## 2017-08-16 LAB — GLUCOSE, CAPILLARY
GLUCOSE-CAPILLARY: 171 mg/dL — AB (ref 65–99)
Glucose-Capillary: 197 mg/dL — ABNORMAL HIGH (ref 65–99)

## 2017-08-16 LAB — HEPARIN LEVEL (UNFRACTIONATED)
HEPARIN UNFRACTIONATED: 0.13 [IU]/mL — AB (ref 0.30–0.70)
Heparin Unfractionated: 0.2 IU/mL — ABNORMAL LOW (ref 0.30–0.70)

## 2017-08-16 LAB — MRSA PCR SCREENING: MRSA BY PCR: NEGATIVE

## 2017-08-16 LAB — HIV ANTIBODY (ROUTINE TESTING W REFLEX): HIV SCREEN 4TH GENERATION: NONREACTIVE

## 2017-08-16 MED ORDER — REGADENOSON 0.4 MG/5ML IV SOLN
0.4000 mg | Freq: Once | INTRAVENOUS | Status: AC
  Administered 2017-08-16: 0.4 mg via INTRAVENOUS
  Filled 2017-08-16: qty 5

## 2017-08-16 MED ORDER — TECHNETIUM TC 99M TETROFOSMIN IV KIT
30.0000 | PACK | Freq: Once | INTRAVENOUS | Status: AC | PRN
  Administered 2017-08-16: 30 via INTRAVENOUS

## 2017-08-16 MED ORDER — NITROGLYCERIN 0.4 MG SL SUBL: 0.4000 mg | SUBLINGUAL_TABLET | SUBLINGUAL | 12 refills | Status: AC | PRN

## 2017-08-16 MED ORDER — HEPARIN BOLUS VIA INFUSION
2000.0000 [IU] | Freq: Once | INTRAVENOUS | Status: AC
  Administered 2017-08-16: 2000 [IU] via INTRAVENOUS
  Filled 2017-08-16: qty 2000

## 2017-08-16 MED ORDER — METOPROLOL TARTRATE 25 MG PO TABS
25.0000 mg | ORAL_TABLET | Freq: Every day | ORAL | Status: DC
  Administered 2017-08-16: 25 mg via ORAL
  Filled 2017-08-16: qty 1

## 2017-08-16 MED ORDER — ATORVASTATIN CALCIUM 40 MG PO TABS: 40.0000 mg | ORAL_TABLET | Freq: Every day | ORAL | 3 refills | Status: DC

## 2017-08-16 MED ORDER — AMLODIPINE BESYLATE 5 MG PO TABS
5.0000 mg | ORAL_TABLET | Freq: Every day | ORAL | Status: DC
  Administered 2017-08-16: 5 mg via ORAL
  Filled 2017-08-16: qty 1

## 2017-08-16 MED ORDER — TECHNETIUM TC 99M TETROFOSMIN IV KIT
10.0000 | PACK | Freq: Once | INTRAVENOUS | Status: AC | PRN
  Administered 2017-08-16: 10 via INTRAVENOUS

## 2017-08-16 MED ORDER — REGADENOSON 0.4 MG/5ML IV SOLN
INTRAVENOUS | Status: AC
  Filled 2017-08-16: qty 5

## 2017-08-16 NOTE — Discharge Instructions (Signed)

## 2017-08-16 NOTE — Progress Notes (Signed)
ANTICOAGULATION CONSULT NOTE Pharmacy Consult for heparin  Indication: chest pain/ACS  No Known Allergies  Patient Measurements: Height: 5\' 4"  (162.6 cm) Weight: 220 lb (99.8 kg) IBW/kg (Calculated) : 54.7 Heparin Dosing Weight: 77kg  Vital Signs: Temp: 98 F (36.7 C) (06/09 2310) Temp Source: Oral (06/09 2310) BP: 174/70 (06/10 0600) Pulse Rate: 78 (06/09 2230)  Labs: Recent Labs    08/15/17 1932 08/15/17 2341 08/16/17 0613  HGB 10.9*  --  10.7*  HCT 32.0*  --  31.0*  PLT 288  --  275  HEPARINUNFRC  --   --  0.13*  CREATININE 1.01*  --   --   TROPONINI  --  <0.03  --     Estimated Creatinine Clearance: 59.5 mL/min (A) (by C-G formula based on SCr of 1.01 mg/dL (H)).  Assessment: 72 y.o. female with chest pain for heparin   Goal of Therapy:  Heparin level 0.3-0.7 units/ml Monitor platelets by anticoagulation protocol: Yes   Plan:  Heparin 2000 units IV bolus, then increase heparin 1200 units/hr Check heparin level in 6 hours.   Geannie RisenGreg Arles Rumbold, PharmD, BCPS  08/16/2017 7:11 AM

## 2017-08-16 NOTE — Plan of Care (Signed)
Pt. Received all discharge paperwork. Verbalized understanding and had all personal belongings.  Code 44 for Case Management. Case Management called but no response and pt. Ready to discharge. Will print paperwork for patient and discharge as it is after hours and ED Case Management no responding.

## 2017-08-16 NOTE — Discharge Summary (Signed)
Discharge summary dictated on 08/16/2017 dictation number is [phone] [id number]

## 2017-08-16 NOTE — Progress Notes (Signed)
ANTICOAGULATION CONSULT NOTE  Pharmacy Consult for heparin  Indication: chest pain/ACS  No Known Allergies  Patient Measurements: Height: 5\' 4"  (162.6 cm) Weight: 220 lb (99.8 kg) IBW/kg (Calculated) : 54.7 Heparin Dosing Weight: 77kg  Vital Signs: Temp: 98.8 F (37.1 C) (06/10 1132) Temp Source: Oral (06/10 1132) BP: 123/64 (06/10 1132) Pulse Rate: 55 (06/10 1132)  Labs: Recent Labs    08/15/17 1932 08/15/17 2341 08/16/17 0613 08/16/17 1214  HGB 10.9*  --  10.7*  --   HCT 32.0*  --  31.0*  --   PLT 288  --  275  --   HEPARINUNFRC  --   --  0.13* 0.20*  CREATININE 1.01*  --  0.88  --   TROPONINI  --  <0.03 <0.03 <0.03    Estimated Creatinine Clearance: 68.3 mL/min (by C-G formula based on SCr of 0.88 mg/dL).   Assessment: [age] you female here with SOB/CP. Pharmacy consulted to dose heparin for r/o ACS. No anticoagulants noted PTA First two heparin levels have been low- most recent level was drawn ~2h early, but a true level is likely still on the low end of range.  Myoview completed today. CBC stable, no bleeding noted.   Goal of Therapy:  Heparin level 0.3-0.7 units/ml Monitor platelets by anticoagulation protocol: Yes   Plan:  Increase heparin to 1350 units/hr Heparin level in 6 hours Daily heparin level and CBC  Merit Maybee D. Brittanya Winburn, PharmD, BCPS Clinical Pharmacist [phone] Please check [facility] for all [facility] 08/16/2017 3:16 PM

## 2017-08-16 NOTE — Progress Notes (Signed)
Subjective:  Patient denies any chest pain states breathing has improved.  Objective:  Vital Signs in the last 24 hours: Temp:  [97.5 F (36.4 C)-98 F (36.7 C)] 97.8 F (36.6 C) (06/10 0727) Pulse Rate:  [76-109] 107 (06/10 1030) Resp:  [16-28] 20 (06/10 0727) BP: (149-199)/(55-99) 169/61 (06/10 1030) SpO2:  [93 %-97 %] 97 % (06/10 0727) Weight:  [98.4 kg (217 lb)-99.8 kg (220 lb)] 99.8 kg (220 lb) (06/09 2310)  Intake/Output from previous day: 06/09 0701 - 06/10 0700 In: 261.4 [I.V.:261.4] Out: -  Intake/Output from this shift: Total I/O In: -  Out: 550 [Urine:550]  Physical Exam: Neck: no adenopathy, no carotid bruit, no JVD and supple, symmetrical, trachea midline Lungs: clear to auscultation bilaterally Heart: regular rate and rhythm, S1, S2 normal and Soft systolic murmur noted Abdomen: soft, non-tender; bowel sounds normal; no masses,  no organomegaly Extremities: extremities normal, atraumatic, no cyanosis or edema  Lab Results: Recent Labs    08/15/17 1932 08/16/17 0613  WBC 10.2 11.1*  HGB 10.9* 10.7*  PLT 288 275   Recent Labs    08/15/17 1932 08/16/17 0613  NA 129* 132*  K 3.9 3.7  CL 93* 96*  CO2 26 26  GLUCOSE 190* 127*  BUN 10 8  CREATININE 1.01* 0.88   Recent Labs    08/15/17 2341 08/16/17 0613  TROPONINI <0.03 <0.03   Hepatic Function Panel No results for input(s): PROT, ALBUMIN, AST, ALT, ALKPHOS, BILITOT, BILIDIR, IBILI in the last 72 hours. Recent Labs    08/16/17 0613  CHOL 123   No results for input(s): PROTIME in the last 72 hours.  Imaging: Imaging results have been reviewed and Dg Chest 2 View  Result Date: 08/15/2017 CLINICAL DATA:  72 y/o F; shortness of breath starting 3 days ago. Dry nonproductive cough. Diarrhea. EXAM: CHEST - 2 VIEW COMPARISON:  06/07/2007 chest radiograph. FINDINGS: Normal cardiac silhouette. Aortic atherosclerosis with calcification. Clear lungs. No pleural effusion or pneumothorax. No acute osseous  abnormality is evident. IMPRESSION: No acute pulmonary process identified. Electronically Signed   By: Mitzi HansenLance  Furusawa-Stratton M.D.   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

## 2017-08-17 NOTE — Discharge Summary (Signed)
NAME: Marilyn Sexton, Marilyn Sexton MEDICAL RECORD [id number] ACCOUNT [id number] DATE OF BIRTH:06/29/1945 FACILITY: [facility] LOCATION: [facility] PHYSICIAN:Joesiah Lonon Jeoffrey Massed, MD  DISCHARGE SUMMARY  DATE OF DISCHARGE:  08/16/2017  DATE OF ADMISSION:  08/15/2017  DATE OF DISCHARGE:  08/16/2017  DISCHARGE DIAGNOSES: 1.  Exertional dyspnea, probably angina equivalent, rule out myocardial infarction, rule out pulmonary embolism, coronary artery disease, history of inferior wall myocardial infarction in the past, status post percutaneous coronary intervention to right  coronary artery in the past. 2.  Uncontrolled hypertension. 3.  Diabetes mellitus. 4.  Hyperlipidemia. 5.  Morbid obesity. 6.  Hyponatremia. 7.  Hypothyroidism. 8.  Depression.  DISCHARGE DIAGNOSES: 1.  Status post exertional dyspnea.  Myocardial infarction ruled out.  Negative nuclear stress test. 2.  Coronary artery disease, history of inferior wall myocardial infarction in the past, status post percutaneous coronary intervention to right coronary artery in the past. 3.  Hypertension. 4.  Diabetes mellitus. 5.  Hyperlipidemia. 6.  Morbid obesity. 7.  Status post resolving hyponatremia. 8.  Hypothyroidism. 9.  Depression.  DISCHARGE MEDICATIONS:   1.  Atorvastatin 40 mg 1 tablet daily. 2.  Nitrostat 0.4 mg sublingual p.r.n.  Continue rest of the home medications: 1.  Amlodipine 5 mg daily. 2.  Aspirin 81 mg daily. 3.  Cogentin 0.5 mg twice daily. 4.  Norco 1 tablet every 4 hours. 5.  Humalog 12 units in a.m., 14 units in p.m.. 6.  Levothyroxine 50 mcg daily. 7.  Metformin 1000 mg twice daily. 8.  Myrbetriq 50 mg half tablet daily. 9.  Systane Balance. 10.  Benazepril 10 mg 1 tablet daily. 11.  Metoprolol tartrate 25 mg twice daily.   12.  Risperdal 0.5 mg at bedtime.   13.  The patient has been advised to stop ibuprofen.  DIET:  Low salt, low cholesterol, weight-reducing 1800 calories ADA diet.    Follow up with me  in 1 week.  CONDITION AT DISCHARGE:  Stable.  BRIEF HISTORY AND HOSPITAL COURSE:  The patient is a 72 year old female with past medical history significant for coronary artery disease, history of non-Q wave/inferior wall MI approximately 10 years ago, status post PTCA stenting to RCA, hypertension,  non-insulin dependent diabetes mellitus, hypothyroidism, hyperlipidemia, morbid obesity, depression.  She came to the ER complaining of progressively increasing shortness of breath for the last 3 days with minimal exertion.  The patient denies any chest  pain, but activity limited.  Denies excessive salty food intake.  Denies leg swelling.  Denies PND, orthopnea.  Denies prolonged immobilization.  EKG done in the ED showed normal sinus rhythm with incomplete right bundle branch block, old inferior wall  MI as before.  No new acute ischemic changes were noted.  First set of cardiac enzymes has been negative.  Chest x-ray showed no acute cardiopulmonary abnormalities.  The patient denies any fever, chills, cough, cold.  PHYSICAL EXAMINATION: VITAL SIGNS:  Her blood pressure was 172/81, pulse 83.  She was afebrile. HEENT:  Conjunctivae pink. NECK:  Supple, no JVD, no bruits. LUNGS:  Clear to auscultation without rhonchi or rales. CARDIOVASCULAR:  S1, S2 was normal.  There was a soft systolic murmur. ABDOMEN:  Soft.  Bowel sounds are present, obese, nontender. EXTREMITIES:  There is no clubbing, cyanosis or edema.  LABORATORY DATA:  Sodium 129, potassium 3.9, glucose 190, BUN 10, creatinine 1.01.  Two sets of cardiac enzymes are negative.  Hemoglobin is 10.9, hematocrit 32, white count of 10.2.  D-dimer was in normal range 0.38.  Repeat sodium is 132, potassium  3.7, glucose 127, BUN 8, creatinine 0.88, cholesterol 123, HDL was 41, triglycerides 161201, LDL 42.  Hemoglobin A1c was 6.1.  TSH was 4.24.  BRIEF HOSPITAL COURSE:  The patient was admitted to telemetry unit.  MI was ruled out by serial enzymes and  EKG.  The patient did not have any episodes of chest pain during the hospital stay.  States her breathing has improved.  The patient subsequently  underwent a Lexiscan Myoview today, which showed no evidence of reversible ischemia with normal LV systolic function.  The patient is ambulating in the room without any problems.  The patient will be discharged home on the above medications and will be  followed up in my office in one week.  TN/NUANCE D:08/16/2017 T:08/17/2017 JOB:[id number]

## 2017-11-10 ENCOUNTER — Other Ambulatory Visit (HOSPITAL_COMMUNITY): Payer: Self-pay | Admitting: Orthopedic Surgery

## 2017-11-10 ENCOUNTER — Ambulatory Visit (HOSPITAL_COMMUNITY)
Admission: RE | Admit: 2017-11-10 | Discharge: 2017-11-10 | Disposition: A | Discharge status: Home / Self Care | Payer: Medicare Other | Source: Ambulatory Visit | Attending: Vascular Surgery | Admitting: Vascular Surgery

## 2017-11-10 DIAGNOSIS — I771 Stricture of artery: Secondary | ICD-10-CM

## 2017-11-29 ENCOUNTER — Other Ambulatory Visit: Payer: Self-pay | Admitting: Orthopedic Surgery

## 2017-12-17 NOTE — Progress Notes (Signed)
08-16-17 (Epic) EKG, Stress, CXR

## 2017-12-17 NOTE — Patient Instructions (Addendum)
Marilyn Sexton  12/17/2017   Your procedure is scheduled on: 12-31-17     Report to [facility] Main  Entrance    Report to Admitting at 5:30 AM    Call this number if you have problems the morning of surgery [phone]     Remember: Do not eat food or drink liquids :After Midnight.    BRUSH YOUR TEETH MORNING OF SURGERY AND RINSE YOUR MOUTH OUT, NO CHEWING GUM CANDY OR MINTS.     Take these medicines the morning of surgery with A SIP OF WATER: Amlodipine (Norvasc), Benztropine (Cogentin), Levothyroxine (Synthroid), Metoprolol (Lopressor), Risperidone (Risperadal)   DO NOT TAKE ANY DIABETIC MEDICATIONS DAY OF YOUR SURGERY                               You may not have any metal on your body including hair pins and              piercings  Do not wear jewelry, make-up, lotions, powders or perfumes, deodorant             Do not wear nail polish.  Do not shave  48 hours prior to surgery.     Do not bring valuables to the hospital. [facility] IS NOT             RESPONSIBLE   FOR VALUABLES.  Contacts, dentures or bridgework may not be worn into surgery.  Leave suitcase in the car. After surgery it may be brought to your room.      Special Instructions: N/A              Please read over the following fact sheets you were given: _____________________________________________________________________             How to Manage Your Diabetes Before and After Surgery  Why is it important to control my blood sugar before and after surgery? . Improving blood sugar levels before and after surgery helps healing and can limit problems. . A way of improving blood sugar control is eating a healthy diet by: o  Eating less sugar and carbohydrates o  Increasing activity/exercise o  Talking with your doctor about reaching your blood sugar goals . High blood sugars (greater than 180 mg/dL) can raise your risk of infections and slow your recovery, so you will  need to focus on controlling your diabetes during the weeks before surgery. . Make sure that the doctor who takes care of your diabetes knows about your planned surgery including the date and location.  How do I manage my blood sugar before surgery? . Check your blood sugar at least 4 times a day, starting 2 days before surgery, to make sure that the level is not too high or low. o Check your blood sugar the morning of your surgery when you wake up and every 2 hours until you get to the Short Stay unit. . If your blood sugar is less than 70 mg/dL, you will need to treat for low blood sugar: o Do not take insulin. o Treat a low blood sugar (less than 70 mg/dL) with  cup of clear juice (cranberry or apple), 4 glucose tablets, OR glucose gel. o Recheck blood sugar in 15 minutes after treatment (to make sure it is greater than 70 mg/dL). If your blood sugar is not  greater than 70 mg/dL on recheck, call [phone] for further instructions. . Report your blood sugar to the short stay nurse when you get to Short Stay.  . If you are admitted to the hospital after surgery: o Your blood sugar will be checked by the staff and you will probably be given insulin after surgery (instead of oral diabetes medicines) to make sure you have good blood sugar levels. o The goal for blood sugar control after surgery is 80-180 mg/dL.   WHAT DO I DO ABOUT MY DIABETES MEDICATION?  Marland Kitchen Do not take oral diabetes medicines (pills) the morning of surgery.  . THE DAY BEFORE SURGERY, you can take your usual Metformin. Take your 12 units of breakfast Humalog insulin. Donot take your night time dose of Humalog          [place] [place] - Preparing for Surgery Before surgery, you can play an important role.  Because skin is not sterile, your skin needs to be as free of germs as possible.  You can reduce the number of germs on your skin by washing with CHG (chlorahexidine gluconate) soap before surgery.  CHG is an antiseptic  cleaner which kills germs and bonds with the skin to continue killing germs even after washing. Please DO NOT use if you have an allergy to CHG or antibacterial soaps.  If your skin becomes reddened/irritated stop using the CHG and inform your nurse when you arrive at Short Stay. Do not shave (including legs and underarms) for at least 48 hours prior to the first CHG shower.  You may shave your face/neck. Please follow these instructions carefully:  1.  Shower with CHG Soap the night before surgery and the  morning of Surgery.  2.  If you choose to wash your hair, wash your hair first as usual with your  normal  shampoo.  3.  After you shampoo, rinse your hair and body thoroughly to remove the  shampoo.                           4.  Use CHG as you would any other liquid soap.  You can apply chg directly  to the skin and wash                       Gently with a scrungie or clean washcloth.  5.  Apply the CHG Soap to your body ONLY FROM THE NECK DOWN.   Do not use on face/ open                           Wound or open sores. Avoid contact with eyes, ears mouth and genitals (private parts).                       Wash face,  Genitals (private parts) with your normal soap.             6.  Wash thoroughly, paying special attention to the area where your surgery  will be performed.  7.  Thoroughly rinse your body with warm water from the neck down.  8.  DO NOT shower/wash with your normal soap after using and rinsing off  the CHG Soap.                9.  Pat yourself dry with a clean towel.  10.  Wear clean pajamas.            11.  Place clean sheets on your bed the night of your first shower and do not  sleep with pets. Day of Surgery : Do not apply any lotions/deodorants the morning of surgery.  Please wear clean clothes to the hospital/surgery center.  FAILURE TO FOLLOW THESE INSTRUCTIONS MAY RESULT IN THE CANCELLATION OF YOUR SURGERY PATIENT  SIGNATURE_________________________________  NURSE SIGNATURE__________________________________  ________________________________________________________________________    Marilyn Sexton  An incentive spirometer is a tool that can help keep your lungs clear and active. This tool measures how well you are filling your lungs with each breath. Taking long deep breaths may help reverse or decrease the chance of developing breathing (pulmonary) problems (especially infection) following:  A long period of time when you are unable to move or be active. BEFORE THE PROCEDURE   If the spirometer includes an indicator to show your best effort, your nurse or respiratory therapist will set it to a desired goal.  If possible, sit up straight or lean slightly forward. Try not to slouch.  Hold the incentive spirometer in an upright position. INSTRUCTIONS FOR USE  1. Sit on the edge of your bed if possible, or sit up as far as you can in bed or on a chair. 2. Hold the incentive spirometer in an upright position. 3. Breathe out normally. 4. Place the mouthpiece in your mouth and seal your lips tightly around it. 5. Breathe in slowly and as deeply as possible, raising the piston or the ball toward the top of the column. 6. Hold your breath for 3-5 seconds or for as long as possible. Allow the piston or ball to fall to the bottom of the column. 7. Remove the mouthpiece from your mouth and breathe out normally. 8. Rest for a few seconds and repeat Steps 1 through 7 at least 10 times every 1-2 hours when you are awake. Take your time and take a few normal breaths between deep breaths. 9. The spirometer may include an indicator to show your best effort. Use the indicator as a goal to work toward during each repetition. 10. After each set of 10 deep breaths, practice coughing to be sure your lungs are clear. If you have an incision (the cut made at the time of surgery), support your incision when coughing  by placing a pillow or rolled up towels firmly against it. Once you are able to get out of bed, walk around indoors and cough well. You may stop using the incentive spirometer when instructed by your caregiver.  RISKS AND COMPLICATIONS  Take your time so you do not get dizzy or light-headed.  If you are in pain, you may need to take or ask for pain medication before doing incentive spirometry. It is harder to take a deep breath if you are having pain. AFTER USE  Rest and breathe slowly and easily.  It can be helpful to keep track of a log of your progress. Your caregiver can provide you with a simple table to help with this. If you are using the spirometer at home, follow these instructions: SEEK MEDICAL CARE IF:   You are having difficultly using the spirometer.  You have trouble using the spirometer as often as instructed.  Your pain medication is not giving enough relief while using the spirometer.  You develop fever of 100.5 F (38.1 C) or higher. SEEK IMMEDIATE MEDICAL CARE IF:   You cough up  bloody sputum that had not been present before.  You develop fever of 102 F (38.9 C) or greater.  You develop worsening pain at or near the incision site. MAKE SURE YOU:   Understand these instructions.  Will watch your condition.  Will get help right away if you are not doing well or get worse. Document Released: 07/06/2006 Document Revised: 05/18/2011 Document Reviewed: 09/06/2006 Exit[employer] Patient Information 2014 ExitCare, [place].   ________________________________________________________________________ WHAT IS A BLOOD TRANSFUSION? Blood Transfusion Information  A transfusion is the replacement of blood or some of its parts. Blood is made up of multiple cells which provide different functions.  Red blood cells carry oxygen and are used for blood loss replacement.  White blood cells fight against infection.  Platelets control bleeding.  Plasma helps clot  blood.  Other blood products are available for specialized needs, such as hemophilia or other clotting disorders. BEFORE THE TRANSFUSION  Who gives blood for transfusions?   Healthy volunteers who are fully evaluated to make sure their blood is safe. This is blood bank blood. Transfusion therapy is the safest it has ever been in the practice of medicine. Before blood is taken from a donor, a complete history is taken to make sure that person has no history of diseases nor engages in risky social behavior (examples are intravenous drug use or sexual activity with multiple partners). The donor's travel history is screened to minimize risk of transmitting infections, such as malaria. The donated blood is tested for signs of infectious diseases, such as HIV and hepatitis. The blood is then tested to be sure it is compatible with you in order to minimize the chance of a transfusion reaction. If you or a relative donates blood, this is often done in anticipation of surgery and is not appropriate for emergency situations. It takes many days to process the donated blood. RISKS AND COMPLICATIONS Although transfusion therapy is very safe and saves many lives, the main dangers of transfusion include:   Getting an infectious disease.  Developing a transfusion reaction. This is an allergic reaction to something in the blood you were given. Every precaution is taken to prevent this. The decision to have a blood transfusion has been considered carefully by your caregiver before blood is given. Blood is not given unless the benefits outweigh the risks. AFTER THE TRANSFUSION  Right after receiving a blood transfusion, you will usually feel much better and more energetic. This is especially true if your red blood cells have gotten low (anemic). The transfusion raises the level of the red blood cells which carry oxygen, and this usually causes an energy increase.  The nurse administering the transfusion will monitor  you carefully for complications. HOME CARE INSTRUCTIONS  No special instructions are needed after a transfusion. You may find your energy is better. Speak with your caregiver about any limitations on activity for underlying diseases you may have. SEEK MEDICAL CARE IF:   Your condition is not improving after your transfusion.  You develop redness or irritation at the intravenous (IV) site. SEEK IMMEDIATE MEDICAL CARE IF:  Any of the following symptoms occur over the next 12 hours:  Shaking chills.  You have a temperature by mouth above 102 F (38.9 C), not controlled by medicine.  Chest, back, or muscle pain.  People around you feel you are not acting correctly or are confused.  Shortness of breath or difficulty breathing.  Dizziness and fainting.  You get a rash or develop hives.  You have a  decrease in urine output.  Your urine turns a dark color or changes to pink, red, or brown. Any of the following symptoms occur over the next 10 days:  You have a temperature by mouth above 102 F (38.9 C), not controlled by medicine.  Shortness of breath.  Weakness after normal activity.  The white part of the eye turns yellow (jaundice).  You have a decrease in the amount of urine or are urinating less often.  Your urine turns a dark color or changes to pink, red, or brown. Document Released: 02/21/2000 Document Revised: 05/18/2011 Document Reviewed: 10/10/2007 [facility] Patient Information 2014 [place], [place].  _______________________________________________________________________

## 2017-12-22 ENCOUNTER — Encounter (HOSPITAL_COMMUNITY)
Admission: RE | Admit: 2017-12-22 | Discharge: 2017-12-22 | Disposition: A | Discharge status: Home / Self Care | Payer: Medicare Other | Source: Ambulatory Visit | Attending: Orthopedic Surgery | Admitting: Orthopedic Surgery

## 2017-12-22 ENCOUNTER — Encounter (HOSPITAL_COMMUNITY): Payer: Self-pay

## 2017-12-22 ENCOUNTER — Other Ambulatory Visit: Payer: Self-pay

## 2017-12-22 ENCOUNTER — Encounter (INDEPENDENT_AMBULATORY_CARE_PROVIDER_SITE_OTHER): Payer: Self-pay

## 2017-12-22 ENCOUNTER — Ambulatory Visit (HOSPITAL_COMMUNITY)
Admission: RE | Admit: 2017-12-22 | Discharge: 2017-12-22 | Disposition: A | Discharge status: Home / Self Care | Payer: Medicare Other | Source: Ambulatory Visit | Attending: Orthopedic Surgery | Admitting: Orthopedic Surgery

## 2017-12-22 DIAGNOSIS — Z01818 Encounter for other preprocedural examination: Secondary | ICD-10-CM | POA: Insufficient documentation

## 2017-12-22 DIAGNOSIS — E119 Type 2 diabetes mellitus without complications: Secondary | ICD-10-CM | POA: Diagnosis not present

## 2017-12-22 DIAGNOSIS — I252 Old myocardial infarction: Secondary | ICD-10-CM | POA: Diagnosis not present

## 2017-12-22 DIAGNOSIS — I10 Essential (primary) hypertension: Secondary | ICD-10-CM | POA: Insufficient documentation

## 2017-12-22 LAB — URINALYSIS, ROUTINE W REFLEX MICROSCOPIC
BILIRUBIN URINE: NEGATIVE
Glucose, UA: NEGATIVE mg/dL
HGB URINE DIPSTICK: NEGATIVE
KETONES UR: NEGATIVE mg/dL
Leukocytes, UA: NEGATIVE
Nitrite: NEGATIVE
Protein, ur: NEGATIVE mg/dL
SPECIFIC GRAVITY, URINE: 1.01 (ref 1.005–1.030)
pH: 5 (ref 5.0–8.0)

## 2017-12-22 LAB — CBC WITH DIFFERENTIAL/PLATELET
Abs Immature Granulocytes: 0.05 10*3/uL (ref 0.00–0.07)
BASOS ABS: 0 10*3/uL (ref 0.0–0.1)
BASOS PCT: 1 %
EOS PCT: 1 %
Eosinophils Absolute: 0.1 10*3/uL (ref 0.0–0.5)
HCT: 32.1 % — ABNORMAL LOW (ref 36.0–46.0)
Hemoglobin: 10.7 g/dL — ABNORMAL LOW (ref 12.0–15.0)
Immature Granulocytes: 1 %
Lymphocytes Relative: 22 %
Lymphs Abs: 1.9 10*3/uL (ref 0.7–4.0)
MCH: 33.9 pg (ref 26.0–34.0)
MCHC: 33.3 g/dL (ref 30.0–36.0)
MCV: 101.6 fL — ABNORMAL HIGH (ref 80.0–100.0)
Monocytes Absolute: 1 10*3/uL (ref 0.1–1.0)
Monocytes Relative: 11 %
NEUTROS ABS: 5.6 10*3/uL (ref 1.7–7.7)
NRBC: 0 % (ref 0.0–0.2)
Neutrophils Relative %: 64 %
PLATELETS: 310 10*3/uL (ref 150–400)
RBC: 3.16 MIL/uL — AB (ref 3.87–5.11)
RDW: 15.5 % (ref 11.5–15.5)
WBC: 8.6 10*3/uL (ref 4.0–10.5)

## 2017-12-22 LAB — COMPREHENSIVE METABOLIC PANEL
ALT: 17 U/L (ref 0–44)
ANION GAP: 12 (ref 5–15)
AST: 20 U/L (ref 15–41)
Albumin: 4.5 g/dL (ref 3.5–5.0)
Alkaline Phosphatase: 49 U/L (ref 38–126)
BILIRUBIN TOTAL: 0.8 mg/dL (ref 0.3–1.2)
BUN: 21 mg/dL (ref 8–23)
CO2: 31 mmol/L (ref 22–32)
Calcium: 10 mg/dL (ref 8.9–10.3)
Chloride: 96 mmol/L — ABNORMAL LOW (ref 98–111)
Creatinine, Ser: 1.1 mg/dL — ABNORMAL HIGH (ref 0.44–1.00)
GFR, EST AFRICAN AMERICAN: 58 mL/min — AB (ref >60)
GFR, EST NON AFRICAN AMERICAN: 50 mL/min — AB (ref >60)
Glucose, Bld: 123 mg/dL — ABNORMAL HIGH (ref 70–99)
Potassium: 4.4 mmol/L (ref 3.5–5.1)
SODIUM: 139 mmol/L (ref 135–145)
TOTAL PROTEIN: 7.2 g/dL (ref 6.5–8.1)

## 2017-12-22 LAB — SURGICAL PCR SCREEN
MRSA, PCR: NEGATIVE
Staphylococcus aureus: NEGATIVE

## 2017-12-22 LAB — HEMOGLOBIN A1C
Hgb A1c MFr Bld: 5.3 % (ref 4.8–5.6)
MEAN PLASMA GLUCOSE: 105.41 mg/dL

## 2017-12-22 LAB — APTT: APTT: 24 s (ref 24–36)

## 2017-12-22 LAB — GLUCOSE, CAPILLARY: Glucose-Capillary: 133 mg/dL — ABNORMAL HIGH (ref 70–99)

## 2017-12-22 LAB — PROTIME-INR
INR: 0.87
PROTHROMBIN TIME: 11.8 s (ref 11.4–15.2)

## 2017-12-22 NOTE — Progress Notes (Signed)
12-02-17 Cardiac Clearance from Dr. Sharyn Lull on chart. ...  Also pt refuses blood. However, pt did not verbalize this at her PAT appt. T&S was cancelled per blood bank, and pt will need to sign refusal of blood consent form in short stay prior to surgery.Marland Kitchen

## 2017-12-24 ENCOUNTER — Other Ambulatory Visit: Payer: Self-pay | Admitting: Orthopedic Surgery

## 2017-12-24 NOTE — Care Plan (Signed)
Spoke with patient prior to surgery. Will discharge to home with family. Has equipment. No other needs at this time.Marland Kitchen

## 2017-12-30 NOTE — Anesthesia Preprocedure Evaluation (Addendum)
Anesthesia Evaluation  Patient identified by MRN, date of birth, ID band Patient awake    Reviewed: Allergy & Precautions, NPO status , Patient's Chart, lab work & pertinent test results, reviewed documented beta blocker date and time   Airway Mallampati: III  TM Distance: >3 FB Neck ROM: Full  Mouth opening: Limited Mouth Opening  Dental no notable dental hx. (+) Edentulous Upper, Edentulous Lower   Pulmonary shortness of breath and with exertion, former smoker,    Pulmonary exam normal breath sounds clear to auscultation       Cardiovascular hypertension, Pt. on medications and Pt. 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 No reversible ischemia.  No change from prior. 2. Normal left ventricular wall motion. 3. Left ventricular ejection fraction 69% 4. Non invasive risk stratification*: Low   Neuro/Psych PSYCHIATRIC DISORDERS Bipolar Disorder    GI/Hepatic negative GI ROS, Neg liver ROS,   Endo/Other  diabetes, Type 2, Insulin Dependent, Oral Hypoglycemic AgentsHypothyroidism   Renal/GU negative Renal ROS     Musculoskeletal  (+) Arthritis , Osteoarthritis,    Abdominal   Peds  Hematology negative hematology ROS (+)   Anesthesia Other Findings   Reproductive/Obstetrics                            Anesthesia Physical Anesthesia Plan  ASA: III  Anesthesia Plan: Regional and Spinal   Post-op Pain Management:  Regional for Post-op pain   Induction: Intravenous  PONV Risk Score and Plan: 2 and Ondansetron and Treatment may vary due to age or medical condition  Airway Management Planned: Simple Face Mask and Natural Airway  Additional Equipment:   Intra-op Plan:   Post-operative Plan:   Informed Consent: I have reviewed the patients  History and Physical, chart, labs and discussed the procedure including the risks, benefits and alternatives for the proposed anesthesia with the patient or authorized representative who has indicated his/her understanding and acceptance.   Dental advisory given  Plan Discussed with: CRNA  Anesthesia Plan Comments:        Anesthesia Quick Evaluation

## 2017-12-31 ENCOUNTER — Inpatient Hospital Stay (HOSPITAL_COMMUNITY)
Admission: RE | Admit: 2017-12-31 | Discharge: 2018-01-04 | DRG: 469 | Disposition: A | Discharge status: Home Health | Payer: Medicare Other | Source: Ambulatory Visit | Attending: Orthopedic Surgery | Admitting: Orthopedic Surgery

## 2017-12-31 ENCOUNTER — Encounter (HOSPITAL_COMMUNITY): Payer: Self-pay | Admitting: Emergency Medicine

## 2017-12-31 ENCOUNTER — Other Ambulatory Visit: Payer: Self-pay

## 2017-12-31 ENCOUNTER — Encounter (HOSPITAL_COMMUNITY)
Admission: RE | Disposition: A | Discharge status: Home Health | Payer: Self-pay | Source: Ambulatory Visit | Attending: Orthopedic Surgery

## 2017-12-31 ENCOUNTER — Ambulatory Visit (HOSPITAL_COMMUNITY): Payer: Medicare Other | Admitting: Anesthesiology

## 2017-12-31 DIAGNOSIS — G9341 Metabolic encephalopathy: Secondary | ICD-10-CM | POA: Diagnosis not present

## 2017-12-31 DIAGNOSIS — E669 Obesity, unspecified: Secondary | ICD-10-CM | POA: Diagnosis present

## 2017-12-31 DIAGNOSIS — Z6835 Body mass index (BMI) 35.0-35.9, adult: Secondary | ICD-10-CM | POA: Diagnosis not present

## 2017-12-31 DIAGNOSIS — E871 Hypo-osmolality and hyponatremia: Secondary | ICD-10-CM | POA: Diagnosis not present

## 2017-12-31 DIAGNOSIS — J441 Chronic obstructive pulmonary disease with (acute) exacerbation: Secondary | ICD-10-CM

## 2017-12-31 DIAGNOSIS — R062 Wheezing: Secondary | ICD-10-CM

## 2017-12-31 DIAGNOSIS — N179 Acute kidney failure, unspecified: Secondary | ICD-10-CM

## 2017-12-31 DIAGNOSIS — M1711 Unilateral primary osteoarthritis, right knee: Secondary | ICD-10-CM | POA: Diagnosis present

## 2017-12-31 DIAGNOSIS — Z79899 Other long term (current) drug therapy: Secondary | ICD-10-CM

## 2017-12-31 DIAGNOSIS — I252 Old myocardial infarction: Secondary | ICD-10-CM

## 2017-12-31 DIAGNOSIS — Z87891 Personal history of nicotine dependence: Secondary | ICD-10-CM

## 2017-12-31 DIAGNOSIS — D62 Acute posthemorrhagic anemia: Secondary | ICD-10-CM | POA: Diagnosis not present

## 2017-12-31 DIAGNOSIS — F3112 Bipolar disorder, current episode manic without psychotic features, moderate: Secondary | ICD-10-CM | POA: Diagnosis present

## 2017-12-31 DIAGNOSIS — Z794 Long term (current) use of insulin: Secondary | ICD-10-CM | POA: Diagnosis not present

## 2017-12-31 DIAGNOSIS — E119 Type 2 diabetes mellitus without complications: Secondary | ICD-10-CM

## 2017-12-31 DIAGNOSIS — I10 Essential (primary) hypertension: Secondary | ICD-10-CM | POA: Diagnosis present

## 2017-12-31 HISTORY — PX: TOTAL KNEE ARTHROPLASTY: SHX125

## 2017-12-31 LAB — GLUCOSE, CAPILLARY
GLUCOSE-CAPILLARY: 154 mg/dL — AB (ref 70–99)
GLUCOSE-CAPILLARY: 145 mg/dL — AB (ref 70–99)
Glucose-Capillary: 167 mg/dL — ABNORMAL HIGH (ref 70–99)
Glucose-Capillary: 198 mg/dL — ABNORMAL HIGH (ref 70–99)
Glucose-Capillary: 215 mg/dL — ABNORMAL HIGH (ref 70–99)

## 2017-12-31 LAB — TYPE AND SCREEN
ABO/RH(D): A POS
Antibody Screen: NEGATIVE

## 2017-12-31 LAB — ABO/RH: ABO/RH(D): A POS

## 2017-12-31 SURGERY — ARTHROPLASTY, KNEE, TOTAL
Anesthesia: Regional | Site: Knee | Laterality: Right

## 2017-12-31 MED ORDER — TRANEXAMIC ACID-NACL 1000-0.7 MG/100ML-% IV SOLN
1000.0000 mg | Freq: Once | INTRAVENOUS | Status: AC
  Administered 2017-12-31: 1000 mg via INTRAVENOUS
  Filled 2017-12-31: qty 100

## 2017-12-31 MED ORDER — PHENYLEPHRINE 40 MCG/ML (10ML) SYRINGE FOR IV PUSH (FOR BLOOD PRESSURE SUPPORT)
PREFILLED_SYRINGE | INTRAVENOUS | Status: AC
  Filled 2017-12-31: qty 20

## 2017-12-31 MED ORDER — FUROSEMIDE 20 MG PO TABS
20.0000 mg | ORAL_TABLET | Freq: Every day | ORAL | Status: DC
  Administered 2017-12-31 – 2018-01-01 (×2): 20 mg via ORAL
  Filled 2017-12-31 (×2): qty 1

## 2017-12-31 MED ORDER — NALOXONE HCL 0.4 MG/ML IJ SOLN
0.4000 mg | INTRAMUSCULAR | Status: DC | PRN
  Administered 2017-12-31: 0.4 mg via INTRAVENOUS
  Filled 2017-12-31: qty 1

## 2017-12-31 MED ORDER — TRANEXAMIC ACID 1000 MG/10ML IV SOLN: 1.5000 mg/kg/h | INTRAVENOUS | Status: DC

## 2017-12-31 MED ORDER — GABAPENTIN 300 MG PO CAPS
300.0000 mg | ORAL_CAPSULE | Freq: Two times a day (BID) | ORAL | Status: DC
  Administered 2017-12-31 – 2018-01-04 (×8): 300 mg via ORAL
  Filled 2017-12-31 (×8): qty 1

## 2017-12-31 MED ORDER — SODIUM CHLORIDE 0.9 % IV SOLN
INTRAVENOUS | Status: DC
  Administered 2018-01-02: 08:00:00 via INTRAVENOUS

## 2017-12-31 MED ORDER — MAGNESIUM CITRATE PO SOLN: 1.0000 | Freq: Once | ORAL | Status: DC | PRN

## 2017-12-31 MED ORDER — DOCUSATE SODIUM 100 MG PO CAPS
100.0000 mg | ORAL_CAPSULE | Freq: Two times a day (BID) | ORAL | Status: DC
  Administered 2018-01-01 – 2018-01-04 (×7): 100 mg via ORAL
  Filled 2017-12-31 (×7): qty 1

## 2017-12-31 MED ORDER — ATORVASTATIN CALCIUM 40 MG PO TABS
40.0000 mg | ORAL_TABLET | Freq: Every day | ORAL | Status: DC
  Administered 2018-01-01 – 2018-01-03 (×3): 40 mg via ORAL
  Filled 2017-12-31 (×3): qty 1

## 2017-12-31 MED ORDER — DEXAMETHASONE SODIUM PHOSPHATE 10 MG/ML IJ SOLN
10.0000 mg | Freq: Two times a day (BID) | INTRAMUSCULAR | Status: AC
  Administered 2017-12-31: 10 mg via INTRAVENOUS
  Filled 2017-12-31: qty 1

## 2017-12-31 MED ORDER — HYDROCHLOROTHIAZIDE 25 MG PO TABS
25.0000 mg | ORAL_TABLET | Freq: Every day | ORAL | Status: DC
  Administered 2017-12-31 – 2018-01-01 (×2): 25 mg via ORAL
  Filled 2017-12-31 (×2): qty 1

## 2017-12-31 MED ORDER — CEFAZOLIN SODIUM-DEXTROSE 2-4 GM/100ML-% IV SOLN
INTRAVENOUS | Status: AC
  Filled 2017-12-31: qty 100

## 2017-12-31 MED ORDER — ONDANSETRON HCL 4 MG/2ML IJ SOLN: 4.0000 mg | Freq: Four times a day (QID) | INTRAMUSCULAR | Status: DC | PRN

## 2017-12-31 MED ORDER — ONDANSETRON HCL 4 MG PO TABS: 4.0000 mg | ORAL_TABLET | Freq: Four times a day (QID) | ORAL | Status: DC | PRN

## 2017-12-31 MED ORDER — RISPERIDONE 1 MG PO TABS
1.0000 mg | ORAL_TABLET | Freq: Two times a day (BID) | ORAL | Status: DC
  Administered 2018-01-01 – 2018-01-04 (×6): 1 mg via ORAL
  Filled 2017-12-31 (×6): qty 1

## 2017-12-31 MED ORDER — HYDROCODONE-ACETAMINOPHEN 5-325 MG PO TABS: 1.0000 | ORAL_TABLET | ORAL | Status: DC | PRN

## 2017-12-31 MED ORDER — NALOXONE HCL 0.4 MG/ML IJ SOLN
INTRAMUSCULAR | Status: AC
  Administered 2017-12-31: 0.4 mg via INTRAVENOUS
  Filled 2017-12-31: qty 1

## 2017-12-31 MED ORDER — BUPIVACAINE LIPOSOME 1.3 % IJ SUSP
20.0000 mL | Freq: Once | INTRAMUSCULAR | Status: AC
  Administered 2017-12-31: 20 mL
  Filled 2017-12-31: qty 20

## 2017-12-31 MED ORDER — TRANEXAMIC ACID-NACL 1000-0.7 MG/100ML-% IV SOLN
1000.0000 mg | INTRAVENOUS | Status: AC
  Administered 2017-12-31: 1000 mg via INTRAVENOUS
  Filled 2017-12-31: qty 100

## 2017-12-31 MED ORDER — PHENYLEPHRINE 40 MCG/ML (10ML) SYRINGE FOR IV PUSH (FOR BLOOD PRESSURE SUPPORT)
PREFILLED_SYRINGE | INTRAVENOUS | Status: DC | PRN
  Administered 2017-12-31: 40 ug via INTRAVENOUS
  Administered 2017-12-31: 80 ug via INTRAVENOUS
  Administered 2017-12-31: 40 ug via INTRAVENOUS
  Administered 2017-12-31 (×5): 80 ug via INTRAVENOUS

## 2017-12-31 MED ORDER — CHLORHEXIDINE GLUCONATE 4 % EX LIQD: 60.0000 mL | Freq: Once | CUTANEOUS | Status: DC

## 2017-12-31 MED ORDER — PROPOFOL 500 MG/50ML IV EMUL
INTRAVENOUS | Status: DC | PRN
  Administered 2017-12-31: 40 ug/kg/min via INTRAVENOUS

## 2017-12-31 MED ORDER — STERILE WATER FOR IRRIGATION IR SOLN
Status: DC | PRN
  Administered 2017-12-31: 3000 mL

## 2017-12-31 MED ORDER — ASPIRIN EC 325 MG PO TBEC
325.0000 mg | DELAYED_RELEASE_TABLET | Freq: Two times a day (BID) | ORAL | Status: DC
  Administered 2018-01-01 – 2018-01-04 (×7): 325 mg via ORAL
  Filled 2017-12-31 (×7): qty 1

## 2017-12-31 MED ORDER — ACETAMINOPHEN 325 MG PO TABS
325.0000 mg | ORAL_TABLET | Freq: Four times a day (QID) | ORAL | Status: DC | PRN
  Administered 2018-01-01 – 2018-01-03 (×4): 650 mg via ORAL
  Filled 2017-12-31 (×4): qty 2

## 2017-12-31 MED ORDER — HYDROMORPHONE HCL 1 MG/ML IJ SOLN
0.5000 mg | INTRAMUSCULAR | Status: DC | PRN
  Administered 2017-12-31: 0.5 mg via INTRAVENOUS
  Filled 2017-12-31: qty 1

## 2017-12-31 MED ORDER — METOPROLOL TARTRATE 50 MG PO TABS
50.0000 mg | ORAL_TABLET | Freq: Two times a day (BID) | ORAL | Status: DC
  Administered 2018-01-01 – 2018-01-04 (×7): 50 mg via ORAL
  Filled 2017-12-31: qty 1
  Filled 2017-12-31: qty 2
  Filled 2017-12-31 (×3): qty 1
  Filled 2017-12-31: qty 2
  Filled 2017-12-31: qty 1

## 2017-12-31 MED ORDER — MIRABEGRON ER 25 MG PO TB24
50.0000 mg | ORAL_TABLET | Freq: Every day | ORAL | Status: DC
  Administered 2018-01-01 – 2018-01-04 (×4): 50 mg via ORAL
  Filled 2017-12-31 (×5): qty 2

## 2017-12-31 MED ORDER — BUPIVACAINE HCL (PF) 0.5 % IJ SOLN
INTRAMUSCULAR | Status: AC
  Filled 2017-12-31: qty 30

## 2017-12-31 MED ORDER — NALOXONE HCL 0.4 MG/ML IJ SOLN
0.4000 mg | INTRAMUSCULAR | Status: DC | PRN
  Administered 2017-12-31 (×2): 0.4 mg via INTRAVENOUS

## 2017-12-31 MED ORDER — ASPIRIN EC 325 MG PO TBEC: 325.0000 mg | DELAYED_RELEASE_TABLET | Freq: Two times a day (BID) | ORAL | 0 refills | Status: DC

## 2017-12-31 MED ORDER — NITROGLYCERIN 0.4 MG SL SUBL: 0.4000 mg | SUBLINGUAL_TABLET | SUBLINGUAL | Status: DC | PRN

## 2017-12-31 MED ORDER — SODIUM CHLORIDE 0.9 % IV SOLN
INTRAVENOUS | Status: DC
  Administered 2017-12-31: 20:00:00 via INTRAVENOUS

## 2017-12-31 MED ORDER — AMLODIPINE BESYLATE 5 MG PO TABS
5.0000 mg | ORAL_TABLET | Freq: Every day | ORAL | Status: DC
  Administered 2018-01-01 – 2018-01-04 (×4): 5 mg via ORAL
  Filled 2017-12-31 (×4): qty 1

## 2017-12-31 MED ORDER — SODIUM CHLORIDE 0.9 % IJ SOLN
INTRAMUSCULAR | Status: DC | PRN
  Administered 2017-12-31: 30 mL

## 2017-12-31 MED ORDER — BUPIVACAINE IN DEXTROSE 0.75-8.25 % IT SOLN
INTRATHECAL | Status: DC | PRN
  Administered 2017-12-31: 1.8 mL via INTRATHECAL

## 2017-12-31 MED ORDER — TRANEXAMIC ACID-NACL 1000-0.7 MG/100ML-% IV SOLN
INTRAVENOUS | Status: AC
  Filled 2017-12-31: qty 100

## 2017-12-31 MED ORDER — OXYCODONE-ACETAMINOPHEN 5-325 MG PO TABS: 1.0000 | ORAL_TABLET | Freq: Four times a day (QID) | ORAL | 0 refills | Status: DC | PRN

## 2017-12-31 MED ORDER — DEXAMETHASONE SODIUM PHOSPHATE 10 MG/ML IJ SOLN
INTRAMUSCULAR | Status: AC
  Filled 2017-12-31: qty 1

## 2017-12-31 MED ORDER — POLYETHYLENE GLYCOL 3350 17 G PO PACK
17.0000 g | PACK | Freq: Every day | ORAL | Status: DC | PRN
  Administered 2018-01-01 – 2018-01-02 (×2): 17 g via ORAL
  Filled 2017-12-31 (×2): qty 1

## 2017-12-31 MED ORDER — METHOCARBAMOL 500 MG IVPB - SIMPLE MED
INTRAVENOUS | Status: AC
  Administered 2017-12-31: 500 mg via INTRAVENOUS
  Filled 2017-12-31: qty 50

## 2017-12-31 MED ORDER — NALOXONE HCL 0.4 MG/ML IJ SOLN
INTRAMUSCULAR | Status: AC
  Administered 2017-12-31: 18:00:00
  Filled 2017-12-31: qty 1

## 2017-12-31 MED ORDER — METFORMIN HCL 500 MG PO TABS
1000.0000 mg | ORAL_TABLET | Freq: Two times a day (BID) | ORAL | Status: DC
  Administered 2018-01-01: 1000 mg via ORAL
  Filled 2017-12-31: qty 2

## 2017-12-31 MED ORDER — BUPIVACAINE HCL (PF) 0.5 % IJ SOLN
INTRAMUSCULAR | Status: DC | PRN
  Administered 2017-12-31: 20 mL

## 2017-12-31 MED ORDER — BISACODYL 5 MG PO TBEC
5.0000 mg | DELAYED_RELEASE_TABLET | Freq: Every day | ORAL | Status: DC | PRN
  Administered 2018-01-03: 5 mg via ORAL
  Filled 2017-12-31: qty 1

## 2017-12-31 MED ORDER — BENAZEPRIL HCL 10 MG PO TABS
20.0000 mg | ORAL_TABLET | Freq: Two times a day (BID) | ORAL | Status: DC
  Administered 2017-12-31 – 2018-01-01 (×2): 20 mg via ORAL
  Filled 2017-12-31 (×2): qty 2

## 2017-12-31 MED ORDER — METHOCARBAMOL 500 MG IVPB - SIMPLE MED
500.0000 mg | Freq: Four times a day (QID) | INTRAVENOUS | Status: DC | PRN
  Administered 2017-12-31: 500 mg via INTRAVENOUS
  Filled 2017-12-31: qty 50

## 2017-12-31 MED ORDER — DIPHENHYDRAMINE HCL 12.5 MG/5ML PO ELIX: 12.5000 mg | ORAL_SOLUTION | ORAL | Status: DC | PRN

## 2017-12-31 MED ORDER — FENTANYL CITRATE (PF) 100 MCG/2ML IJ SOLN: 25.0000 ug | INTRAMUSCULAR | Status: DC | PRN

## 2017-12-31 MED ORDER — PROPOFOL 10 MG/ML IV BOLUS
INTRAVENOUS | Status: AC
  Filled 2017-12-31: qty 60

## 2017-12-31 MED ORDER — ONDANSETRON HCL 4 MG/2ML IJ SOLN
INTRAMUSCULAR | Status: AC
  Filled 2017-12-31: qty 2

## 2017-12-31 MED ORDER — OXYCODONE HCL 5 MG PO TABS
5.0000 mg | ORAL_TABLET | ORAL | Status: DC | PRN
  Administered 2017-12-31: 5 mg via ORAL
  Filled 2017-12-31: qty 2

## 2017-12-31 MED ORDER — PROPOFOL 10 MG/ML IV BOLUS
INTRAVENOUS | Status: DC | PRN
  Administered 2017-12-31: 10 mg via INTRAVENOUS
  Administered 2017-12-31 (×2): 20 mg via INTRAVENOUS
  Administered 2017-12-31: 10 mg via INTRAVENOUS

## 2017-12-31 MED ORDER — ROPIVACAINE HCL 7.5 MG/ML IJ SOLN
INTRAMUSCULAR | Status: DC | PRN
  Administered 2017-12-31: 20 mL via PERINEURAL

## 2017-12-31 MED ORDER — INSULIN ASPART 100 UNIT/ML ~~LOC~~ SOLN
0.0000 [IU] | Freq: Three times a day (TID) | SUBCUTANEOUS | Status: DC
  Administered 2018-01-01: 2 [IU] via SUBCUTANEOUS
  Administered 2018-01-01 (×2): 3 [IU] via SUBCUTANEOUS
  Administered 2018-01-02 (×2): 5 [IU] via SUBCUTANEOUS
  Administered 2018-01-02: 3 [IU] via SUBCUTANEOUS
  Administered 2018-01-03: 8 [IU] via SUBCUTANEOUS
  Administered 2018-01-03: 3 [IU] via SUBCUTANEOUS

## 2017-12-31 MED ORDER — CELECOXIB 200 MG PO CAPS
200.0000 mg | ORAL_CAPSULE | Freq: Two times a day (BID) | ORAL | Status: DC
  Administered 2017-12-31 – 2018-01-04 (×8): 200 mg via ORAL
  Filled 2017-12-31 (×8): qty 1

## 2017-12-31 MED ORDER — SODIUM CHLORIDE 0.9 % IJ SOLN
INTRAMUSCULAR | Status: AC
  Filled 2017-12-31: qty 50

## 2017-12-31 MED ORDER — DOCUSATE SODIUM 100 MG PO CAPS: 100.0000 mg | ORAL_CAPSULE | Freq: Two times a day (BID) | ORAL | 0 refills | Status: DC

## 2017-12-31 MED ORDER — ONDANSETRON HCL 4 MG/2ML IJ SOLN
INTRAMUSCULAR | Status: DC | PRN
  Administered 2017-12-31: 4 mg via INTRAVENOUS

## 2017-12-31 MED ORDER — QUETIAPINE FUMARATE 25 MG PO TABS
25.0000 mg | ORAL_TABLET | Freq: Every day | ORAL | Status: DC
  Administered 2018-01-01 – 2018-01-03 (×3): 25 mg via ORAL
  Filled 2017-12-31 (×3): qty 1

## 2017-12-31 MED ORDER — CEFAZOLIN SODIUM-DEXTROSE 2-4 GM/100ML-% IV SOLN
2.0000 g | INTRAVENOUS | Status: AC
  Administered 2017-12-31: 2 g via INTRAVENOUS

## 2017-12-31 MED ORDER — FENTANYL CITRATE (PF) 100 MCG/2ML IJ SOLN
INTRAMUSCULAR | Status: AC
  Filled 2017-12-31: qty 2

## 2017-12-31 MED ORDER — MIDAZOLAM HCL 2 MG/2ML IJ SOLN
INTRAMUSCULAR | Status: AC
  Filled 2017-12-31: qty 2

## 2017-12-31 MED ORDER — 0.9 % SODIUM CHLORIDE (POUR BTL) OPTIME
TOPICAL | Status: DC | PRN
  Administered 2017-12-31: 1000 mL

## 2017-12-31 MED ORDER — INSULIN ASPART 100 UNIT/ML ~~LOC~~ SOLN
5.0000 [IU] | Freq: Two times a day (BID) | SUBCUTANEOUS | Status: DC
  Administered 2017-12-31 – 2018-01-03 (×5): 5 [IU] via SUBCUTANEOUS

## 2017-12-31 MED ORDER — LACTATED RINGERS IV SOLN
INTRAVENOUS | Status: DC
  Administered 2017-12-31 (×2): via INTRAVENOUS

## 2017-12-31 MED ORDER — DEXAMETHASONE SODIUM PHOSPHATE 10 MG/ML IJ SOLN
INTRAMUSCULAR | Status: DC | PRN
  Administered 2017-12-31: 10 mg via INTRAVENOUS

## 2017-12-31 MED ORDER — CEFAZOLIN SODIUM-DEXTROSE 2-4 GM/100ML-% IV SOLN
2.0000 g | Freq: Four times a day (QID) | INTRAVENOUS | Status: AC
  Administered 2017-12-31 (×2): 2 g via INTRAVENOUS
  Filled 2017-12-31 (×2): qty 100

## 2017-12-31 MED ORDER — BENZTROPINE MESYLATE 0.5 MG PO TABS
0.5000 mg | ORAL_TABLET | Freq: Two times a day (BID) | ORAL | Status: DC
  Administered 2018-01-01 – 2018-01-04 (×7): 0.5 mg via ORAL
  Filled 2017-12-31 (×8): qty 1

## 2017-12-31 MED ORDER — LEVOTHYROXINE SODIUM 75 MCG PO TABS
75.0000 ug | ORAL_TABLET | Freq: Every day | ORAL | Status: DC
  Administered 2018-01-01 – 2018-01-04 (×4): 75 ug via ORAL
  Filled 2017-12-31 (×4): qty 1

## 2017-12-31 MED ORDER — HYDROMORPHONE HCL 1 MG/ML IJ SOLN: 0.5000 mg | INTRAMUSCULAR | Status: DC | PRN

## 2017-12-31 MED ORDER — TIZANIDINE HCL 2 MG PO TABS: 2.0000 mg | ORAL_TABLET | Freq: Three times a day (TID) | ORAL | 0 refills | Status: DC | PRN

## 2017-12-31 MED ORDER — SODIUM CHLORIDE 0.9 % IR SOLN
Status: DC | PRN
  Administered 2017-12-31: 1000 mL

## 2017-12-31 MED ORDER — ALUM & MAG HYDROXIDE-SIMETH 200-200-20 MG/5ML PO SUSP: 30.0000 mL | ORAL | Status: DC | PRN

## 2017-12-31 MED ORDER — METHOCARBAMOL 500 MG PO TABS
500.0000 mg | ORAL_TABLET | Freq: Four times a day (QID) | ORAL | Status: DC | PRN
  Administered 2018-01-01: 500 mg via ORAL
  Filled 2017-12-31: qty 1

## 2017-12-31 SURGICAL SUPPLY — 61 items
APL SKNCLS STERI-STRIP NONHPOA (GAUZE/BANDAGES/DRESSINGS) ×1
ATTUNE MED DOME PAT 38 KNEE (Knees) ×1 IMPLANT
ATTUNE MED DOME PAT 38MM KNEE (Knees) ×1 IMPLANT
ATTUNE PS FEM RT SZ 5 CEM KNEE (Femur) ×2 IMPLANT
ATTUNE PSRP INSR SZ5 5 KNEE (Insert) ×1 IMPLANT
ATTUNE PSRP INSR SZ5 5MM KNEE (Insert) ×1 IMPLANT
BAG SPEC THK2 15X12 ZIP CLS (MISCELLANEOUS) ×1
BAG ZIPLOCK 12X15 (MISCELLANEOUS) ×3 IMPLANT
BANDAGE ACE 6X5 VEL STRL LF (GAUZE/BANDAGES/DRESSINGS) ×3 IMPLANT
BENZOIN TINCTURE PRP APPL 2/3 (GAUZE/BANDAGES/DRESSINGS) ×3 IMPLANT
BLADE SAGITTAL 25.0X1.19X90 (BLADE) ×2 IMPLANT
BLADE SAGITTAL 25.0X1.19X90MM (BLADE) ×1
BLADE SAW SGTL 11.0X1.19X90.0M (BLADE) ×3 IMPLANT
BOOTIES KNEE HIGH SLOAN (MISCELLANEOUS) ×3 IMPLANT
BOWL SMART MIX CTS (DISPOSABLE) ×3 IMPLANT
CEMENT HV SMART SET (Cement) ×6 IMPLANT
CLOSURE WOUND 1/2 X4 (GAUZE/BANDAGES/DRESSINGS) ×1
COVER WAND RF STERILE (DRAPES) ×2 IMPLANT
CUFF TOURN SGL QUICK 34 (TOURNIQUET CUFF) ×3
CUFF TRNQT CYL 34X4X40X1 (TOURNIQUET CUFF) ×1 IMPLANT
DECANTER SPIKE VIAL GLASS SM (MISCELLANEOUS) ×2 IMPLANT
DRAPE U-SHAPE 47X51 STRL (DRAPES) ×3 IMPLANT
DRSG AQUACEL AG ADV 3.5X10 (GAUZE/BANDAGES/DRESSINGS) ×3 IMPLANT
DURAPREP 26ML APPLICATOR (WOUND CARE) ×3 IMPLANT
ELECT REM PT RETURN 15FT ADLT (MISCELLANEOUS) ×3 IMPLANT
GLOVE BIOGEL PI IND STRL 7.0 (GLOVE) IMPLANT
GLOVE BIOGEL PI IND STRL 7.5 (GLOVE) IMPLANT
GLOVE BIOGEL PI IND STRL 8 (GLOVE) ×2 IMPLANT
GLOVE BIOGEL PI INDICATOR 7.0 (GLOVE) ×4
GLOVE BIOGEL PI INDICATOR 7.5 (GLOVE) ×4
GLOVE BIOGEL PI INDICATOR 8 (GLOVE) ×4
GLOVE ECLIPSE 7.5 STRL STRAW (GLOVE) ×6 IMPLANT
GOWN STRL REUS W/ TWL XL LVL3 (GOWN DISPOSABLE) IMPLANT
GOWN STRL REUS W/TWL XL LVL3 (GOWN DISPOSABLE) ×12 IMPLANT
HANDPIECE INTERPULSE COAX TIP (DISPOSABLE) ×3
HOLDER FOLEY CATH W/STRAP (MISCELLANEOUS) ×2 IMPLANT
HOOD PEEL AWAY FLYTE STAYCOOL (MISCELLANEOUS) ×9 IMPLANT
IMMOBILIZER KNEE 20 (SOFTGOODS) ×3
IMMOBILIZER KNEE 20 THIGH 36 (SOFTGOODS) ×1 IMPLANT
IMMOBILIZER KNEE 22 UNIV (SOFTGOODS) ×2 IMPLANT
MANIFOLD NEPTUNE II (INSTRUMENTS) ×3 IMPLANT
NEEDLE HYPO 22GX1.5 SAFETY (NEEDLE) ×3 IMPLANT
PACK ICE MAXI GEL EZY WRAP (MISCELLANEOUS) ×3 IMPLANT
PACK TOTAL KNEE CUSTOM (KITS) ×3 IMPLANT
PADDING CAST COTTON 6X4 STRL (CAST SUPPLIES) ×3 IMPLANT
PIN STEINMAN FIXATION KNEE (PIN) ×2 IMPLANT
PIN THREADED HEADED SIGMA (PIN) ×2 IMPLANT
POSITIONER SURGICAL ARM (MISCELLANEOUS) ×3 IMPLANT
SET HNDPC FAN SPRY TIP SCT (DISPOSABLE) ×1 IMPLANT
STAPLER VISISTAT 35W (STAPLE) IMPLANT
STRIP CLOSURE SKIN 1/2X4 (GAUZE/BANDAGES/DRESSINGS) ×2 IMPLANT
SUT MNCRL AB 3-0 PS2 18 (SUTURE) ×3 IMPLANT
SUT VIC AB 0 CT1 36 (SUTURE) ×3 IMPLANT
SUT VIC AB 1 CT1 36 (SUTURE) ×6 IMPLANT
SUT VIC AB 2-0 CT1 27 (SUTURE) ×6
SUT VIC AB 2-0 CT1 TAPERPNT 27 (SUTURE) ×2 IMPLANT
SYR CONTROL 10ML LL (SYRINGE) ×6 IMPLANT
TIBIAL BASE ROTATING PLATFORM, SIZE 6 CEMENTED (Knees) ×2 IMPLANT
TRAY FOLEY MTR SLVR 16FR STAT (SET/KITS/TRAYS/PACK) ×3 IMPLANT
WRAP KNEE MAXI GEL POST OP (GAUZE/BANDAGES/DRESSINGS) ×2 IMPLANT
YANKAUER SUCT BULB TIP 10FT TU (MISCELLANEOUS) ×3 IMPLANT

## 2017-12-31 NOTE — Brief Op Note (Signed)
12/31/2017  9:23 AM  PATIENT:  Marilyn Sexton  72 y.o. female  PRE-OPERATIVE DIAGNOSIS:  OSTEOARTHRITIS RIGHT KNEE  POST-OPERATIVE DIAGNOSIS:  OSTEOARTHRITIS RIGHT KNEE  PROCEDURE:  Procedure(s) with comments: RIGHT TOTAL KNEE ARTHROPLASTY (Right) - Adductor Block  SURGEON:  Surgeon(s) and Role:    Jodi Geralds, MD - Primary  PHYSICIAN ASSISTANT:   ASSISTANTS: jim bethune   ANESTHESIA:   spinal  EBL:  25 mL   BLOOD ADMINISTERED:none  DRAINS: none   LOCAL MEDICATIONS USED:  MARCAINE    and OTHER experel  SPECIMEN:  No Specimen  DISPOSITION OF SPECIMEN:  N/A  COUNTS:  YES  TOURNIQUET:   Total Tourniquet Time Documented: Thigh (Right) - 54 minutes Total: Thigh (Right) - 54 minutes   DICTATION: .Other Dictation: Dictation Number [id number]?  PLAN OF CARE: Admit to inpatient   PATIENT DISPOSITION:  PACU - hemodynamically stable.   Delay start of Pharmacological VTE agent (>24hrs) due to surgical blood loss or risk of bleeding: no

## 2017-12-31 NOTE — Progress Notes (Signed)
PT Cancellation Note  Patient Details Name: Marilyn Sexton MRN: [id number] DOB: Feb 01, 1946   Cancelled Treatment:    Reason Eval/Treat Not Completed: Fatigue/lethargy limiting ability to participate. Patient does not arouse enough to participate. Had IV medication 2 hours ago. RN in to assess. Rada Hay 12/31/2017, 3:52 PM  Blanchard Kelch PT Acute Rehabilitation Services Pager [phone] Office [phone]

## 2017-12-31 NOTE — H&P (Signed)
TOTAL KNEE ADMISSION H&P  Patient is being admitted for right total knee arthroplasty.  Subjective:  Chief Complaint:right knee pain.  HPI: Marilyn Sexton, 72 y.o. female, has a history of pain and functional disability in the right knee due to arthritis and has failed non-surgical conservative treatments for greater than 12 weeks to includeNSAID's and/or analgesics, corticosteriod injections, viscosupplementation injections, flexibility and strengthening excercises, weight reduction as appropriate and activity modification.  Onset of symptoms was gradual, starting 5 years ago with gradually worsening course since that time. The patient noted no past surgery on the right knee(s).  Patient currently rates pain in the right knee(s) at 8 out of 10 with activity. Patient has night pain, worsening of pain with activity and weight bearing, pain that interferes with activities of daily living, pain with passive range of motion, crepitus and joint swelling.  Patient has evidence of subchondral sclerosis, periarticular osteophytes and joint space narrowing by imaging studies. This patient has had Failure of all reasonable conservative care. There is no active infection.  Patient Active Problem List   Diagnosis Date Noted  . Acute coronary syndrome ([place]) 08/15/2017  . Old complex tear of lateral meniscus of right knee   . Loose body in knee, right knee   . Bipolar affective disorder, manic, moderate ([place]) 07/23/2014  . History of medication noncompliance   . Manic behavior ([place])    Past Medical History:  Diagnosis Date  . Diabetes mellitus without complication ([place])   . Dyspnea   . Hypertension   . Myocardial infarction ([place])   . Thyroid disease     Past Surgical History:  Procedure Laterality Date  . CARDIAC CATHETERIZATION    . CHOLECYSTECTOMY    . coronary stents    . EYE SURGERY     bilateral cataracts  . KNEE ARTHROSCOPY Right 12/16/2016   Procedure: RIGHT KNEE ARTHROSCOPY AND  DEBRIDEMENT;  Surgeon: Newt Minion, MD;  Location: [facility];  Service: Orthopedics;  Laterality: Right;  . TUBAL LIGATION      Current Facility-Administered Medications  Medication Dose Route Frequency Provider Last Rate Last Dose  . bupivacaine liposome (EXPAREL) 1.3 % injection 266 mg  20 mL Infiltration Once Dorna Leitz, MD      . ceFAZolin (ANCEF) 2-4 GM/100ML-% IVPB           . ceFAZolin (ANCEF) IVPB 2g/100 mL premix  2 g Intravenous On Call to OR Dorna Leitz, MD      . chlorhexidine (HIBICLENS) 4 % liquid 4 application  60 mL Topical Once Dorna Leitz, MD      . lactated ringers infusion   Intravenous Continuous Freddrick March, MD 50 mL/hr at 12/31/17 0641    . tranexamic acid (CYKLOKAPRON) 1000MG/144m IVPB           . tranexamic acid (CYKLOKAPRON) 2,500 mg in sodium chloride 0.9 % 250 mL (10 mg/mL) infusion  1.5 mg/kg/hr Intravenous To OR GDorna Leitz MD       No Known Allergies  Social History   Tobacco Use  . Smoking status: Former Smoker    Packs/day: 1.00    Years: 44.00    Pack years: 44.00    Types: Cigarettes    Last attempt to quit: 2006    Years since quitting: 13.8  . Smokeless tobacco: Never Used  Substance Use Topics  . Alcohol use: No    History reviewed. No pertinent family history.   ROS ROS: I have reviewed the patient's review of  systems thoroughly and there are no positive responses as relates to the HPI. Objective:  Physical Exam  Vital signs in last 24 hours: Temp:  [98.1 F (36.7 C)] 98.1 F (36.7 C) (10/25 0551) Pulse Rate:  [76] 76 (10/25 0551) Resp:  [20] 20 (10/25 0551) BP: (171)/(69) 171/69 (10/25 0551) SpO2:  [95 %] 95 % (10/25 0551) Weight:  [97.2 kg] 97.2 kg (10/25 0612) Well-developed well-nourished patient in no acute distress. Alert and oriented x3 HEENT:within normal limits Cardiac: Regular rate and rhythm Pulmonary: Lungs clear to auscultation Abdomen: Soft and nontender.  Normal active bowel sounds  Musculoskeletal:  (Right knee: Limited range of motion.  Painful range of motion.  Mild valgus malalignment.  No instability.  Trace effusion. Labs: Recent Results (from the past 2160 hour(s))  Surgical pcr screen     Status: None   Collection Time: 12/22/17  1:40 PM  Result Value Ref Range   MRSA, PCR NEGATIVE NEGATIVE   Staphylococcus aureus NEGATIVE NEGATIVE    Comment: (NOTE) The Xpert SA Assay (FDA approved for NASAL specimens in patients 62 years of age and older), is one component of a comprehensive surveillance program. It is not intended to diagnose infection nor to guide or monitor treatment. Performed at [facility], [place] [address], [place], [place] [address]   Glucose, capillary     Status: Abnormal   Collection Time: 12/22/17  1:59 PM  Result Value Ref Range   Glucose-Capillary 133 (H) 70 - 99 mg/dL  APTT     Status: None   Collection Time: 12/22/17  2:49 PM  Result Value Ref Range   aPTT 24 24 - 36 seconds    Comment: Performed at [facility], [place] [address], [place], [place] [address]  CBC WITH DIFFERENTIAL     Status: Abnormal   Collection Time: 12/22/17  2:49 PM  Result Value Ref Range   WBC 8.6 4.0 - 10.5 K/uL   RBC 3.16 (L) 3.87 - 5.11 MIL/uL   Hemoglobin 10.7 (L) 12.0 - 15.0 g/dL   HCT 32.1 (L) 36.0 - 46.0 %   MCV 101.6 (H) 80.0 - 100.0 fL   MCH 33.9 26.0 - 34.0 pg   MCHC 33.3 30.0 - 36.0 g/dL   RDW 15.5 11.5 - 15.5 %   Platelets 310 150 - 400 K/uL   nRBC 0.0 0.0 - 0.2 %   Neutrophils Relative % 64 %   Neutro Abs 5.6 1.7 - 7.7 K/uL   Lymphocytes Relative 22 %   Lymphs Abs 1.9 0.7 - 4.0 K/uL   Monocytes Relative 11 %   Monocytes Absolute 1.0 0.1 - 1.0 K/uL   Eosinophils Relative 1 %   Eosinophils Absolute 0.1 0.0 - 0.5 K/uL   Basophils Relative 1 %   Basophils Absolute 0.0 0.0 - 0.1 K/uL   Immature Granulocytes 1 %   Abs Immature Granulocytes 0.05 0.00 - 0.07 K/uL    Comment: Performed at [facility], [place] [address], [place],  [address]  Comprehensive metabolic panel     Status: Abnormal   Collection Time: 12/22/17  2:49 PM  Result Value Ref Range   Sodium 139 135 - 145 mmol/L   Potassium 4.4 3.5 - 5.1 mmol/L   Chloride 96 (L) 98 - 111 mmol/L   CO2 31 22 - 32 mmol/L   Glucose, Bld 123 (H) 70 - 99 mg/dL   BUN 21 8 - 23 mg/dL   Creatinine, Ser 1.10 (  H) 0.44 - 1.00 mg/dL   Calcium 10.0 8.9 - 10.3 mg/dL   Total Protein 7.2 6.5 - 8.1 g/dL   Albumin 4.5 3.5 - 5.0 g/dL   AST 20 15 - 41 U/L   ALT 17 0 - 44 U/L   Alkaline Phosphatase 49 38 - 126 U/L   Total Bilirubin 0.8 0.3 - 1.2 mg/dL   GFR calc non Af Amer 50 (L) >60 mL/min   GFR calc Af Amer 58 (L) >60 mL/min    Comment: (NOTE) The eGFR has been calculated using the CKD EPI equation. This calculation has not been validated in all clinical situations. eGFR's persistently <60 mL/min signify possible Chronic Kidney Disease.    Anion gap 12 5 - 15    Comment: Performed at [facility], [place] [address] [place], [place] [address]  Protime-INR     Status: None   Collection Time: 12/22/17  2:49 PM  Result Value Ref Range   Prothrombin Time 11.8 11.4 - 15.2 seconds   INR 0.87     Comment: Performed at [facility], [place] [address] [place], [place] [address]  Urinalysis, Routine w reflex microscopic     Status: Abnormal   Collection Time: 12/22/17  2:49 PM  Result Value Ref Range   Color, Urine STRAW (A) YELLOW   APPearance CLEAR CLEAR   Specific Gravity, Urine 1.010 1.005 - 1.030   pH 5.0 5.0 - 8.0   Glucose, UA NEGATIVE NEGATIVE mg/dL   Hgb urine dipstick NEGATIVE NEGATIVE   Bilirubin Urine NEGATIVE NEGATIVE   Ketones, ur NEGATIVE NEGATIVE mg/dL   Protein, ur NEGATIVE NEGATIVE mg/dL   Nitrite NEGATIVE NEGATIVE   Leukocytes, UA NEGATIVE NEGATIVE    Comment: Performed at [place] [address] [place], [place] [address]  Hemoglobin A1c     Status: None    Collection Time: 12/22/17  2:49 PM  Result Value Ref Range   Hgb A1c MFr Bld 5.3 4.8 - 5.6 %    Comment: (NOTE) Pre diabetes:          5.7%-6.4% Diabetes:              >6.4% Glycemic control for   <7.0% adults with diabetes    Mean Plasma Glucose 105.41 mg/dL    Comment: Performed at [place] [address], [place],  [address]  Glucose, capillary     Status: Abnormal   Collection Time: 12/31/17  5:54 AM  Result Value Ref Range   Glucose-Capillary 154 (H) 70 - 99 mg/dL   Comment 1 Notify RN    Comment 2 Document in Chart     Estimated body mass index is 35.67 kg/m as calculated from the following:   Height as of this encounter: '5\' 5"'  (1.651 m).   Weight as of this encounter: 97.2 kg.   Imaging Review Plain radiographs demonstrate severe degenerative joint disease of the right knee(s). The overall alignment ismild valgus. The bone quality appears to be fair for age and reported activity level.   Preoperative templating of the joint replacement has been completed, documented, and submitted to the Operating Room personnel in order to optimize intra-operative equipment management.   Anticipated LOS equal to or greater than 2 midnights due to - Age 36 and older with one or more of the following:  - Obesity  - Expected need for hospital services (PT, OT, Nursing) required for safe  discharge  - Anticipated need for  postoperative skilled nursing care or inpatient rehab  - Active co-morbidities: Coronary Artery Disease and Psychiatric disorders including bipolar disorder OR   - Unanticipated findings during/Post Surgery: Slow post-op progression: GI, pain control, mobility  - Patient is a high risk of re-admission due to: Patient is a history of coronary artery disease which I am concerned will slow her postoperative rehabilitation and she has a history of psychiatric disorders including bipolar disorder which may have an unanticipated effect  postoperatively     Assessment/Plan:  End stage arthritis, right knee   The patient history, physical examination, clinical judgment of the provider and imaging studies are consistent with end stage degenerative joint disease of the right knee(s) and total knee arthroplasty is deemed medically necessary. The treatment options including medical management, injection therapy arthroscopy and arthroplasty were discussed at length. The risks and benefits of total knee arthroplasty were presented and reviewed. The risks due to aseptic loosening, infection, stiffness, patella tracking problems, thromboembolic complications and other imponderables were discussed. The patient acknowledged the explanation, agreed to proceed with the plan and consent was signed. Patient is being admitted for inpatient treatment for surgery, pain control, PT, OT, prophylactic antibiotics, VTE prophylaxis, progressive ambulation and ADL's and discharge planning. The patient is planning to be discharged home with home health services

## 2017-12-31 NOTE — Anesthesia Postprocedure Evaluation (Signed)
Anesthesia Post Note  Patient: Marilyn Sexton  Procedure(s) Performed: RIGHT TOTAL KNEE ARTHROPLASTY (Right Knee)     Patient location during evaluation: PACU Anesthesia Type: Regional and Spinal Level of consciousness: oriented and awake and alert Pain management: pain level controlled Vital Signs Assessment: post-procedure vital signs reviewed and stable Respiratory status: spontaneous breathing, respiratory function stable and patient connected to nasal cannula oxygen Cardiovascular status: blood pressure returned to baseline and stable Postop Assessment: no headache, no backache and no apparent nausea or vomiting Anesthetic complications: no    Last Vitals:  Vitals:   12/31/17 1409 12/31/17 1459  BP: 134/81 (!) 104/53  Pulse: 84 70  Resp: 16 12  Temp: 36.7 C 36.7 C  SpO2: 100% 99%    Last Pain:  Vitals:   12/31/17 1445  TempSrc:   PainSc: Asleep                 Yavonne Kiss L Lonnette Shrode

## 2017-12-31 NOTE — Anesthesia Procedure Notes (Signed)
Spinal  Patient location during procedure: OR Start time: 12/31/2017 7:41 AM End time: 12/31/2017 7:48 AM Staffing Anesthesiologist: Elmer Picker, MD Resident/CRNA: Epimenio Sarin, CRNA Performed: resident/CRNA  Preanesthetic Checklist Completed: patient identified, surgical consent, pre-op evaluation, timeout performed, IV checked, risks and benefits discussed and monitors and equipment checked Spinal Block Patient position: sitting Prep: DuraPrep Patient monitoring: heart rate, cardiac monitor, continuous pulse ox and blood pressure Approach: midline Location: L3-4 Needle Needle gauge: 22 G Needle length: 9 cm Needle insertion depth: 8 cm Assessment Sensory level: T8

## 2017-12-31 NOTE — Progress Notes (Signed)
Patient unresponsive to pain and voice. Rapid response called and Dr. Luiz Blare paged. 0.8mg  Narcan given by rapid response. Patient arousable to pain and follows simple commands after narcan was given. Vitals stable throughout incident, blood glucose normal.

## 2017-12-31 NOTE — Progress Notes (Signed)
Discussed blood transfusion and consent for blood transfusion with the patient, her husband and son. All agree to give consent for blood transfusion related to surgery. Dr. Luiz Blare advised. Consent obtained and specimen sent.

## 2017-12-31 NOTE — Significant Event (Signed)
Rapid Response Event Note  Overview: Time Called: 1637 Arrival Time: 1639 Event Type: Neurologic  Initial Focused Assessment:  Called to room by bedside RN. Patient unresponsive, CBG checked and within normal limits. Patient had received, Oral pain medication, 0.5 of Dilaudid, and oral Neurontin, since post op.  Narcan per standing rapid response orders administered 0.4 initially over 1 minute. After administration patient began waking up and became responsive to pain. Second dose of 0.4 administered per protocol and patient returned to post op alertness, responding to voice and following commands. Patient remaining slightly drowsy but now following commands with equal sided hand grip. Blood pressure initially pre-Narcan was 94/55, now BP 105/62. Heart rate remaining stable, good respiratory effort and oxygen saturations in 96-100 range.   Marilyn Sexton 12/31/2017, 5:02 PM    Interventions:  Narcan administered  Plan of Care (if not transferred):   Bedside RN to call [employer] if patient's neuro status declines.     Marilyn Sexton

## 2017-12-31 NOTE — Transfer of Care (Signed)
Immediate Anesthesia Transfer of Care Note  Patient: Marilyn Sexton  Procedure(s) Performed: RIGHT TOTAL KNEE ARTHROPLASTY (Right Knee)  Patient Location: PACU  Anesthesia Type:Spinal  Level of Consciousness: drowsy  Airway & Oxygen Therapy: Patient Spontanous Breathing and Patient connected to face mask oxygen  Post-op Assessment: Report given to RN and Post -op Vital signs reviewed and stable  Post vital signs: Reviewed and stable  Last Vitals:  Vitals Value Taken Time  BP 132/81 12/31/2017  9:40 AM  Temp    Pulse 74 12/31/2017  9:41 AM  Resp 19 12/31/2017  9:41 AM  SpO2 100 % 12/31/2017  9:41 AM  Vitals shown include unvalidated device data.  Last Pain:  Vitals:   12/31/17 0612  TempSrc:   PainSc: 0-No pain      Patients Stated Pain Goal: 4 (12/31/17 0612)  Complications: No apparent anesthesia complications

## 2017-12-31 NOTE — Op Note (Signed)
NAME: Marilyn Sexton, Marilyn Sexton MEDICAL RECORD ZO:[id number] ACCOUNT [id number] DATE OF BIRTH:08-14-1945 FACILITY: [facility] LOCATION: [facility] PHYSICIAN:Kandas Oliveto L. Emberlee Sortino, MD  OPERATIVE REPORT  DATE OF PROCEDURE:  12/31/2017  PREOPERATIVE DIAGNOSIS:  End-stage degenerative joint disease, right knee.  POSTOPERATIVE DIAGNOSIS:  End-stage degenerative joint disease, right knee.  PROCEDURE:  Right total knee replacement with an Attune system, size 5 femur, size 6 tibia, 5 mm bridging bearing, and a 38 mm all polyethylene patella.  SURGEON:  Jodi Geralds, MD  ASSISTANT:  Marshia Ly PA-C, who was present for the entire case and was critical in performance of the case including retraction, saw cuts, and closure to minimize OR time.  ANESTHESIA:  Spinal.  BRIEF HISTORY:  The patient is a 72 year old female with a long history significant complaints of right knee pain.  She was treated conservatively for a long period of time with injection therapy, activity modification and weight loss.  She failed all of  this and was having night pain and light activity pain, and because of failure of all conservative care, is taken to the operating room for right total knee replacement.  DESCRIPTION OF PROCEDURE:  The patient was taken to the operating room where after adequate anesthesia was obtained, the right knee was prepped and draped in the usual sterile fashion.  Following this, the leg was exsanguinated, blood pressure tourniquet  inflated to 300 mmHg.  Following this, a midline incision was made in subcutaneous dissected down to the level of the extensor mechanism and a medial parapatellar arthrotomy was undertaken.  Following this, attention was turned to the knee where medial  and lateral meniscus were removed, retropatellar fat pad, synovium on the anterior aspect of the femur, and anterior and posterior cruciates.  Once this was done, an intramedullary pilot hole was drilled in the femur and a 5 degree  valgus inclination cut  is made with resection of 9 mm of distal bone off the high side.  At this point, attention was turned towards the femur where it sized to a 5.  Anterior and posterior cuts were made chamfers and box.  Attention was then turned to the tibia that is cut  perpendicular to its long axis and drilled  and keeled to a size 6.  The 5 poly was put in place and the knee had excellent range of motion and stability.  Attention was turned to the patella cut down to the level of 13 mm and the lugs were drilled for a  38 all poly patella and lugs were drilled in the femur.  The trial components were then removed and the knee was copiously and thoroughly lavaged with pulsatile lavage irrigation and suctioned dry.  The final components were then cemented into place,  size 5 femur, size 6 tibia, 5 mm bridging bearing, and a 38 mm all polyethylene patella.  Attention was then turned towards allowing the cement to completely harden.  All excess bone cement was removed.  Once cement was completely hardened, the  tourniquet was let down, all bleeders controlled with electrocautery.  Exparel was instilled throughout the synovial reflections for postoperative pain control and the final poly size 5 was opened and placed after we retested for extension range of  motion.  Once this was done, the attention was turned towards the closure.  A medial parapatellar arthrotomy was closed with #1 Vicryl running.  The skin was closed with 0 and 2-0 Vicryl and 3-0 Monocryl subcuticular.  Benzoin and Steri-Strips were  applied.  Sterile  compressive dressing was applied and the patient was taken to recovery was noted to be in satisfactory condition.  Estimated blood loss for the procedure is minimal.  TN/NUANCE  D:12/31/2017 T:12/31/2017 JOB:[id number]

## 2017-12-31 NOTE — Plan of Care (Signed)
  Problem: Clinical Measurements: Goal: Ability to maintain clinical measurements within normal limits will improve Outcome: Progressing Goal: Will remain free from infection Outcome: Progressing Goal: Diagnostic test results will improve Outcome: Progressing Goal: Respiratory complications will improve Outcome: Progressing Goal: Cardiovascular complication will be avoided Outcome: Progressing   Problem: Activity: Goal: Risk for activity intolerance will decrease Outcome: Progressing   Problem: Nutrition: Goal: Adequate nutrition will be maintained Outcome: Progressing   Problem: Coping: Goal: Level of anxiety will decrease Outcome: Progressing   Problem: Elimination: Goal: Will not experience complications related to bowel motility Outcome: Progressing Goal: Will not experience complications related to urinary retention Outcome: Progressing   Problem: Pain Managment: Goal: General experience of comfort will improve Outcome: Progressing   Problem: Skin Integrity: Goal: Risk for impaired skin integrity will decrease Outcome: Progressing   Problem: Education: Goal: Knowledge of the prescribed therapeutic regimen will improve Outcome: Progressing Goal: Individualized Educational Video(s) Outcome: Progressing   Problem: Activity: Goal: Ability to avoid complications of mobility impairment will improve Outcome: Progressing Goal: Range of joint motion will improve Outcome: Progressing   Problem: Clinical Measurements: Goal: Postoperative complications will be avoided or minimized Outcome: Progressing   Problem: Pain Management: Goal: Pain level will decrease with appropriate interventions Outcome: Progressing   Problem: Skin Integrity: Goal: Will show signs of wound healing Outcome: Progressing

## 2017-12-31 NOTE — Anesthesia Procedure Notes (Addendum)
Anesthesia Regional Block: Adductor canal block   Pre-Anesthetic Checklist: ,, timeout performed, Correct Patient, Correct Site, Correct Laterality, Correct Procedure, Correct Position, site marked, Risks and benefits discussed,  Surgical consent,  Pre-op evaluation,  At surgeon's request and post-op pain management  Laterality: Right  Prep: Maximum Sterile Barrier Precautions used, chloraprep       Needles:  Injection technique: Single-shot  Needle Type: Echogenic Stimulator Needle     Needle Length: 9cm  Needle Gauge: 22     Additional Needles:   Procedures:,,,, ultrasound used (permanent image in chart),,,,  Narrative:  Start time: 12/31/2017 7:20 AM End time: 12/31/2017 7:30 AM Injection made incrementally with aspirations every 5 mL.  Performed by: Personally  Anesthesiologist: Elmer Picker, MD  Additional Notes: Monitors applied. No increased pain on injection. No increased resistance to injection. Injection made in 5cc increments. Good needle visualization. Patient tolerated procedure well.

## 2017-12-31 NOTE — Discharge Instructions (Signed)

## 2018-01-01 ENCOUNTER — Inpatient Hospital Stay (HOSPITAL_COMMUNITY): Payer: Medicare Other

## 2018-01-01 DIAGNOSIS — M1711 Unilateral primary osteoarthritis, right knee: Principal | ICD-10-CM

## 2018-01-01 LAB — CBC
HCT: 29.4 % — ABNORMAL LOW (ref 36.0–46.0)
HEMOGLOBIN: 9.7 g/dL — AB (ref 12.0–15.0)
MCH: 33.3 pg (ref 26.0–34.0)
MCHC: 33 g/dL (ref 30.0–36.0)
MCV: 101 fL — ABNORMAL HIGH (ref 80.0–100.0)
PLATELETS: 270 10*3/uL (ref 150–400)
RBC: 2.91 MIL/uL — AB (ref 3.87–5.11)
RDW: 15.3 % (ref 11.5–15.5)
WBC: 13.8 10*3/uL — AB (ref 4.0–10.5)
nRBC: 0 % (ref 0.0–0.2)

## 2018-01-01 LAB — BASIC METABOLIC PANEL
Anion gap: 12 (ref 5–15)
BUN: 23 mg/dL (ref 8–23)
CHLORIDE: 88 mmol/L — AB (ref 98–111)
CO2: 29 mmol/L (ref 22–32)
CREATININE: 1.32 mg/dL — AB (ref 0.44–1.00)
Calcium: 8.9 mg/dL (ref 8.9–10.3)
GFR, EST AFRICAN AMERICAN: 46 mL/min — AB (ref >60)
GFR, EST NON AFRICAN AMERICAN: 40 mL/min — AB (ref >60)
Glucose, Bld: 186 mg/dL — ABNORMAL HIGH (ref 70–99)
Potassium: 4.5 mmol/L (ref 3.5–5.1)
SODIUM: 129 mmol/L — AB (ref 135–145)

## 2018-01-01 LAB — GLUCOSE, CAPILLARY
GLUCOSE-CAPILLARY: 197 mg/dL — AB (ref 70–99)
GLUCOSE-CAPILLARY: 172 mg/dL — AB (ref 70–99)
GLUCOSE-CAPILLARY: 148 mg/dL — AB (ref 70–99)
Glucose-Capillary: 161 mg/dL — ABNORMAL HIGH (ref 70–99)

## 2018-01-01 MED ORDER — ALBUTEROL SULFATE (2.5 MG/3ML) 0.083% IN NEBU
2.5000 mg | INHALATION_SOLUTION | RESPIRATORY_TRACT | Status: DC | PRN
  Administered 2018-01-01: 2.5 mg via RESPIRATORY_TRACT
  Filled 2018-01-01: qty 3

## 2018-01-01 MED ORDER — FUROSEMIDE 10 MG/ML IJ SOLN
20.0000 mg | Freq: Once | INTRAMUSCULAR | Status: AC
  Administered 2018-01-01: 20 mg via INTRAVENOUS
  Filled 2018-01-01: qty 2

## 2018-01-01 MED ORDER — TRAMADOL HCL 50 MG PO TABS
50.0000 mg | ORAL_TABLET | Freq: Four times a day (QID) | ORAL | Status: DC | PRN
  Administered 2018-01-01 (×2): 50 mg via ORAL
  Filled 2018-01-01 (×3): qty 1

## 2018-01-01 MED ORDER — IPRATROPIUM-ALBUTEROL 0.5-2.5 (3) MG/3ML IN SOLN
3.0000 mL | Freq: Four times a day (QID) | RESPIRATORY_TRACT | Status: DC
  Administered 2018-01-01: 3 mL via RESPIRATORY_TRACT
  Filled 2018-01-01: qty 3

## 2018-01-01 MED ORDER — IPRATROPIUM-ALBUTEROL 0.5-2.5 (3) MG/3ML IN SOLN: 3.0000 mL | Freq: Four times a day (QID) | RESPIRATORY_TRACT | Status: DC

## 2018-01-01 NOTE — Progress Notes (Signed)
PATIENT ID: Marilyn Sexton  MRN: [id number]  DOB/AGE:  70-14-1947 / 70 y.o.  1 Day Post-Op Procedure(s) (LRB): RIGHT TOTAL KNEE ARTHROPLASTY (Right)    PROGRESS NOTE Subjective: Patient is alert, oriented, no Nausea, no Vomiting, yes passing gas. Taking PO sips Corky Downs.  Had a rapid response team yesterday probably secondary to narcotics that responded reasonably well to Narcan.  Has not had physical therapy secondary to the rapid response team yesterday. Denies SOB, Chest or Calf Pain. Using Incentive Spirometer, PAS in place. Ambulate WBAT, Patient reports pain as 4/10 .    Objective: Vital signs in last 24 hours: Vitals:   12/31/17 1840 12/31/17 2154 01/01/18 0216 01/01/18 0531  BP:  (Abnormal) 149/74 140/66 (Abnormal) 158/69  Pulse:  81 83 100  Resp:  18    Temp: 98.6 F (37 C) 98.6 F (37 C)  98.3 F (36.8 C)  TempSrc: Axillary Oral  Oral  SpO2:  98% 97% 97%  Weight:      Height:          Intake/Output from previous day: I/O last 3 completed shifts: In: 3463 [P.O.:600; I.V.:2863] Out: 4125 [Urine:4100; Blood:25]   Intake/Output this shift: No intake/output data recorded.   LABORATORY DATA: Recent Labs    12/31/17 1239 12/31/17 1634 12/31/17 2344 01/01/18 0322  WBC  --   --   --  13.8*  HGB  --   --   --  9.7*  HCT  --   --   --  29.4*  PLT  --   --   --  270  NA  --   --   --  129*  K  --   --   --  4.5  CL  --   --   --  88*  CO2  --   --   --  29  BUN  --   --   --  23  CREATININE  --   --   --  1.32*  GLUCOSE  --   --   --  186*  GLUCAP 215* 198* 167*  --   CALCIUM  --   --   --  8.9    Examination: Neurologically intact ABD soft Neurovascular intact Sensation intact distally Intact pulses distally Dorsiflexion/Plantar flexion intact Incision: dressing C/D/I No cellulitis present Compartment soft}  Assessment:   1 Day Post-Op Procedure(s) (LRB): RIGHT TOTAL KNEE ARTHROPLASTY (Right) ADDITIONAL DIAGNOSIS: Expected Acute Blood Loss  Anemia, history of bipolar disease, history of coronary artery syndrome morbid obesity Anticipated LOS equal to or greater than 2 midnights due to - Age 70 and older with one or more of the following:  - Obesity  - Expected need for hospital services (PT, OT, Nursing) required for safe  discharge  - Anticipated need for postoperative skilled nursing care or inpatient rehab  - Bipolar ds     Plan: PT/OT WBAT, AROM and PROM  DVT Prophylaxis:  SCDx72hrs, ASA 81 mg BID x 2 weeks DISCHARGE PLAN: Home DISCHARGE NEEDS: HHPT, Walker and 3-in-1 comode seat     Nestor Lewandowsky 01/01/2018, 7:19 AM

## 2018-01-01 NOTE — Progress Notes (Signed)
I was notified by Blanchard Kelch, PT that the pt was experiencing expiratory wheezes as she assisted the pt out of the chair. On reassessment, expiratory wheezes were noted and did not clear up when the pt coughed. The pt denied chest pain or SOB. The pt reported coughing up a thick white mucous throughout the day. I advised the pt to notify me the next time she coughs anything up. The pt SATs were 96% on room air and respirations of 17. The pt has been using the IS, but has only been able to reach around 1200. Pt was encouraged to continue to use the IS 10 times an hour. The pt reported that she has been doing so and will continue to do so.   Turner Daniels, the on-call MD was paged/notified of this change. Turner Daniels gave verbal orders for a chest xray. Turner Daniels also indicated that he would put in an order  for a Hospitalist consult.   I will continue to monitor the pt.

## 2018-01-01 NOTE — Evaluation (Signed)
Physical Therapy Evaluation Patient Details Name: Marilyn Sexton MRN: [id number] DOB: March 31, 1945 Today's Date: 01/01/2018   History of Present Illness  RTKA. H/O DM and MI, Required Nrcan 10/25 PM.  Clinical Impression  The patient is awake and able to participate, ambulated x 60'. Pt admitted with above diagnosis. Pt currently with functional limitations due to the deficits listed below (see PT Problem List). Pt will benefit from skilled PT to increase their independence and safety with mobility to allow discharge to the venue listed below.       Follow Up Recommendations Home health PT    Equipment Recommendations  None recommended by PT    Recommendations for Other Services       Precautions / Restrictions Precautions Precautions: Knee;Fall Required Braces or Orthoses: Knee Immobilizer - Right Knee Immobilizer - Right: Discontinue once straight leg raise with < 10 degree lag      Mobility  Bed Mobility Overal bed mobility: Needs Assistance Bed Mobility: Supine to Sit     Supine to sit: Mod assist;HOB elevated     General bed mobility comments: multimodal cues for technique, body position. required mod assist for right leg and trunk to sit upright.  Transfers Overall transfer level: Needs assistance Equipment used: Rolling walker (2 wheeled) Transfers: Sit to/from Stand Sit to Stand: Mod assist;From elevated surface         General transfer comment: cues for hand and right leg position  Ambulation/Gait Ambulation/Gait assistance: Min assist;+2 safety/equipment Gait Distance (Feet): 60 Feet Assistive device: Rolling walker (2 wheeled) Gait Pattern/deviations: Step-to pattern;Step-through pattern     General Gait Details: frequent cues for position inside RW, gait sequence .  Stairs            Wheelchair Mobility    Modified Rankin (Stroke Patients Only)       Balance                                              Pertinent Vitals/Pain Pain Assessment: Faces Faces Pain Scale: Hurts even more Pain Location: right knee Pain Descriptors / Indicators: Discomfort;Grimacing Pain Intervention(s): Premedicated before session;Repositioned;Monitored during session;Ice applied    Home Living Family/patient expects to be discharged to:: Private residence Living Arrangements: Spouse/significant other;Children Available Help at Discharge: Family Type of Home: House Home Access: Ramped entrance;Stairs to enter   Entrance Stairs-Number of Steps: 1 at ramp Home Layout: One level Home Equipment: Environmental consultant - 2 wheels;Cane - single point;Shower seat;Bedside commode;Wheelchair - manual      Prior Function Level of Independence: Independent with assistive device(s)               Hand Dominance        Extremity/Trunk Assessment   Upper Extremity Assessment Upper Extremity Assessment: Overall WFL for tasks assessed    Lower Extremity Assessment Lower Extremity Assessment: RLE deficits/detail RLE Deficits / Details: knee flexion  to 40 degrees, SLR with mod assist    Cervical / Trunk Assessment Cervical / Trunk Assessment: Normal  Communication   Communication: No difficulties  Cognition Arousal/Alertness: Awake/alert Behavior During Therapy: WFL for tasks assessed/performed Overall Cognitive Status: Within Functional Limits for tasks assessed                                 General Comments: a little  sluggish for questions. Requires frequent cues for safety and for use of RW. patient required NARCAn yesterday.      General Comments      Exercises Total Joint Exercises Ankle Circles/Pumps: AROM;Right;Left;10 reps Quad Sets: AROM;Right;10 reps Heel Slides: AAROM;Right;10 reps;Supine Hip ABduction/ADduction: AAROM;Right;10 reps;Supine Straight Leg Raises: AAROM;Right;10 reps;Supine   Assessment/Plan    PT Assessment Patient needs continued PT services  PT Problem  List Decreased strength;Decreased range of motion;Decreased activity tolerance;Decreased mobility;Decreased knowledge of precautions;Decreased safety awareness;Decreased knowledge of use of DME;Pain       PT Treatment Interventions DME instruction;Therapeutic exercise;Gait training;Stair training;Functional mobility training;Therapeutic activities;Patient/family education    PT Goals (Current goals can be found in the Care Plan section)  Acute Rehab PT Goals Patient Stated Goal: to go home PT Goal Formulation: With patient/family Time For Goal Achievement: 01/04/18 Potential to Achieve Goals: Good    Frequency 7X/week   Barriers to discharge        Co-evaluation               AM-PAC PT "6 Clicks" Daily Activity  Outcome Measure Difficulty turning over in bed (including adjusting bedclothes, sheets and blankets)?: Unable Difficulty moving from lying on back to sitting on the side of the bed? : Unable Difficulty sitting down on and standing up from a chair with arms (e.g., wheelchair, bedside commode, etc,.)?: Unable Help needed moving to and from a bed to chair (including a wheelchair)?: A Lot Help needed walking in hospital room?: A Lot Help needed climbing 3-5 steps with a railing? : Total 6 Click Score: 8    End of Session Equipment Utilized During Treatment: Gait belt;Right knee immobilizer Activity Tolerance: Patient tolerated treatment well Patient left: in chair;with call bell/phone within reach;with family/visitor present;with chair alarm set   PT Visit Diagnosis: Unsteadiness on feet (R26.81);Pain Pain - Right/Left: Right Pain - part of body: Knee    Time: 6962-9528 PT Time Calculation (min) (ACUTE ONLY): 36 min   Charges:   PT Evaluation $PT Eval Low Complexity: 1 Low PT Treatments $Gait Training: 8-22 mins        Blanchard Kelch PT Acute Rehabilitation Services Pager [phone] Office [phone]   Rada Hay 01/01/2018, 1:03  PM

## 2018-01-01 NOTE — Consult Note (Signed)
Medical Consultation   Marilyn Sexton  ZOX:[id number]  DOB: [birth year]  DOA: 12/31/2017  PCP: Adrian Prince, MD   Requesting physician:dr rowan   Reason for consultation:wheezing  History of Present Illness: Marilyn Sexton is an 72 y.o. female with a history of acute coronary syndrome, mild COPD, bipolar disorder, type 2 diabetes, hypertension, hypothyroidism admitted for right knee pain and is now status post right total knee arthroplasty.  Today patient started complaining of wheezing.  She denies shortness of breath!  She denies chest pain she does have a cough and brings up white phlegm.  She reports using an inhaler very long time ago even more than 5 years ago.  She smoked for 23 years. Looking the records patient has hyponatremia sodium 129 which is a drop from 139 earlier this month and patient has been receiving normal saline she also has an elevated white count compared to yesterday to 13.8 and her creatinine has bumped up to 1.32.  She denies any history of DVT PE or family history of blood clots.  Chest x-ray that was just done shows left lower lobe haziness await final official report      ROS see above patient reports having had no bowel movement since hospital admission.  She is put on stool softeners.  She does not think she needs any extra stool softeners or laxatives at this time.  Past Medical History: Past Medical History:  Diagnosis Date  . Diabetes mellitus without complication (HCC)   . Dyspnea   . Hypertension   . Myocardial infarction (HCC)   . Thyroid disease     Past Surgical History: Past Surgical History:  Procedure Laterality Date  . CARDIAC CATHETERIZATION    . CHOLECYSTECTOMY    . coronary stents    . EYE SURGERY     bilateral cataracts  . KNEE ARTHROSCOPY Right 12/16/2016   Procedure: RIGHT KNEE ARTHROSCOPY AND DEBRIDEMENT;  Surgeon: Nadara Mustard, MD;  Location: [facility];  Service: Orthopedics;  Laterality: Right;    . TUBAL LIGATION       Allergies:  No Known Allergies   Social History:  reports that she quit smoking about 13 years ago. Her smoking use included cigarettes. She has a 44.00 pack-year smoking history. She has never used smokeless tobacco. She reports that she does not drink alcohol or use drugs.   Family History: History reviewed. No pertinent family history.  Physical Exam: Vitals:   01/01/18 0531 01/01/18 0931 01/01/18 1351 01/01/18 1405  BP: (!) 158/69 (!) 148/58  (!) 144/65  Pulse: 100 (!) 110  79  Resp:   17 16  Temp: 98.3 F (36.8 C) 98.2 F (36.8 C)  98.7 F (37.1 C)  TempSrc: Oral Oral  Oral  SpO2: 97% 95% 96% 98%  Weight:      Height:        Constitutional  Alert and awake, oriented x3, not in any acute distress. Eyes: PERLA, EOMI, irises appear normal, anicteric sclera,  ENMT: external ears and nose appear normal            Lips appears normal, oropharynx mucosa, tongue, posterior pharynx appear normal  Neck: neck appears normal, no masses, normal ROM, no thyromegaly, no JVD  CVS: S1-S2 clear, no murmur rubs or gallops, no LE edema, normal pedal pulses  Respiratory: Wheezing to auscultation bilaterally, no wheezing, rales or rhonchi. Respiratory effort normal. No accessory  muscle use.  Abdomen: soft nontender, nondistended, normal bowel sounds, no hepatosplenomegaly, no hernias  Musculoskeletal: : no cyanosis, clubbing or edema noted bilaterally                        Neuro: Cranial nerves II-XII intact, strength, sensation, reflexes Psych: judgement and insight appear normal, stable mood and affect, mental status Skin: no rashes or lesions or ulcers, no induration or nodules    Data reviewed:  I have personally reviewed following labs and imaging studies Labs:  CBC: Recent Labs  Lab 01/01/18 0322  WBC 13.8*  HGB 9.7*  HCT 29.4*  MCV 101.0*  PLT 270    Basic Metabolic Panel: Recent Labs  Lab 01/01/18 0322  NA 129*  K 4.5  CL 88*  CO2 29   GLUCOSE 186*  BUN 23  CREATININE 1.32*  CALCIUM 8.9   GFR Estimated Creatinine Clearance: 45.8 mL/min (A) (by C-G formula based on SCr of 1.32 mg/dL (H)). Liver Function Tests: No results for input(s): AST, ALT, ALKPHOS, BILITOT, PROT, ALBUMIN in the last 168 hours. No results for input(s): LIPASE, AMYLASE in the last 168 hours. No results for input(s): AMMONIA in the last 168 hours. Coagulation profile No results for input(s): INR, PROTIME in the last 168 hours.  Cardiac Enzymes: No results for input(s): CKTOTAL, CKMB, CKMBINDEX, TROPONINI in the last 168 hours. BNP: Invalid input(s): POCBNP CBG: Recent Labs  Lab 12/31/17 1239 12/31/17 1634 12/31/17 2344 01/01/18 0735 01/01/18 1203  GLUCAP 215* 198* 167* 197* 172*   D-Dimer No results for input(s): DDIMER in the last 72 hours. Hgb A1c No results for input(s): HGBA1C in the last 72 hours. Lipid Profile No results for input(s): CHOL, HDL, LDLCALC, TRIG, CHOLHDL, LDLDIRECT in the last 72 hours. Thyroid function studies No results for input(s): TSH, T4TOTAL, T3FREE, THYROIDAB in the last 72 hours.  Invalid input(s): FREET3 Anemia work up No results for input(s): VITAMINB12, FOLATE, FERRITIN, TIBC, IRON, RETICCTPCT in the last 72 hours. Urinalysis    Component Value Date/Time   COLORURINE STRAW (A) 12/22/2017 1449   APPEARANCEUR CLEAR 12/22/2017 1449   LABSPEC 1.010 12/22/2017 1449   PHURINE 5.0 12/22/2017 1449   GLUCOSEU NEGATIVE 12/22/2017 1449   HGBUR NEGATIVE 12/22/2017 1449   BILIRUBINUR NEGATIVE 12/22/2017 1449   KETONESUR NEGATIVE 12/22/2017 1449   PROTEINUR NEGATIVE 12/22/2017 1449   UROBILINOGEN 0.2 07/20/2014 0732   NITRITE NEGATIVE 12/22/2017 1449   LEUKOCYTESUR NEGATIVE 12/22/2017 1449     Microbiology No results found for this or any previous visit (from the past 240 hour(s)).     Inpatient Medications:   Scheduled Meds: . amLODipine  5 mg Oral Daily  . aspirin EC  325 mg Oral BID PC  .  atorvastatin  40 mg Oral q1800  . benazepril  20 mg Oral BID  . benztropine  0.5 mg Oral BID  . celecoxib  200 mg Oral BID  . docusate sodium  100 mg Oral BID  . furosemide  20 mg Oral Daily  . gabapentin  300 mg Oral BID  . hydrochlorothiazide  25 mg Oral Daily  . insulin aspart  0-15 Units Subcutaneous TID WC  . insulin aspart  5 Units Subcutaneous BID  . levothyroxine  75 mcg Oral QAC breakfast  . metFORMIN  1,000 mg Oral BID WC  . metoprolol tartrate  50 mg Oral BID  . mirabegron ER  50 mg Oral Daily  . QUEtiapine  25 mg  Oral QHS  . risperiDONE  1 mg Oral BID   Continuous Infusions: . sodium chloride    . methocarbamol (ROBAXIN) IV Stopped (12/31/17 1053)     Radiological Exams on Admission: No results found.  Impression/Recommendations Principal Problem:   Primary osteoarthritis of right knee  #1 status post right total knee arthroplasty per Ortho  #2 mild COPD patient with history of smoking and mild COPD not on inhaler on a regular basis at home.  Currently she is diffusely wheezing.  I will order albuterol nebulizer stat and prescribed DuoNeb to be used as outpatient.  #3 type 2 diabetes new insulin I will hold metformin because of the rising creatinine and restart tomorrow if creatinine better  #4 AKI with slight bump in creatinine I will hold ACE inhibitor and metformin and recheck labs tomorrow and restart if renal functions are improving.  #5 hyponatremia possibly multifactorial secondary to pain patient looks more hypervolemic I will give her a dose of Lasix.  DC HCTZ for now.  #6 mild leukocytosis patient has received a dose of steroids yesterday monitor for infection.  Thank you for this consultation.  Our [facility] hospitalist team will follow the patient with you.   Time Spent:   Alwyn Ren M.D. [facility] 01/01/2018, 2:44 PM

## 2018-01-01 NOTE — Progress Notes (Addendum)
The on-call dr was paged regarding pain medication for the pt. Per report from night RN, the pt received Narcan x3 yesterday related to pain medication. Pt is complaining of pain, but I am only able to give PRN Tylenol at this time. On-call MD was paged to readdress pain management.   Turner Daniels, on-call MD returned my call at [phone] and gave a verbal order for Ultram 50 mg PO PRN Q-6.

## 2018-01-01 NOTE — Progress Notes (Addendum)
Physical Therapy Treatment Patient Details Name: Marilyn Sexton MRN: [id number] DOB: [birth year] Today's Date: 01/01/2018    History of Present Illness RTKA. H/O DM and MI, Required Narcan 10/25 PM.    PT Comments    Patient is noted to have wheezing with efforts for sitting and  lying down mostly, some with ambulating. RN aware. Patient is progressing well. Patient ambulated x 90'. Minimal  Complaints of pain this visit. O2 sats 96% on RA, HR 87/ continue PT.   Follow Up Recommendations  Home health PT;Supervision/Assistance - 24 hour     Equipment Recommendations  None recommended by PT    Recommendations for Other Services OT consult     Precautions / Restrictions Precautions Precautions: Knee;Fall Required Braces or Orthoses: Knee Immobilizer - Right Knee Immobilizer - Right: Discontinue once straight leg raise with < 10 degree lag    Mobility  Bed Mobility Overal bed mobility: Needs Assistance Bed Mobility: Sit to Supine     Supine to sit: Mod assist;HOB elevated Sit to supine: Mod assist   General bed mobility comments: Required assist  with right leg and trunk  when assisting to sit upright in recliner when leg rest placed in  down position. Patient with noted wheezing with efforts for sitting upright and especially for lying back down. Sats 96%. HR 87.Assist  with legs and trunk back ontop bed.  Transfers Overall transfer level: Needs assistance Equipment used: Rolling walker (2 wheeled) Transfers: Sit to/from Stand Sit to Stand: Mod assist;From elevated surface         General transfer comment: cues for hand and right leg position, multimodal cues for backing up to seat and for placing hands to arm rest.  Ambulation/Gait Ambulation/Gait assistance: Min assist;+2 safety/equipment Gait Distance (Feet): 20 Feet(then 90') Assistive device: Rolling walker (2 wheeled) Gait Pattern/deviations: Step-to pattern;Step-through pattern     General Gait  Details: frequent cues for position inside RW, gait sequence  posture, tends to roll Rw forward.   Stairs             Wheelchair Mobility    Modified Rankin (Stroke Patients Only)       Balance                                            Cognition Arousal/Alertness: Awake/alert Behavior During Therapy: WFL for tasks assessed/performed Overall Cognitive Status: Within Functional Limits for tasks assessed                                 General Comments: a little sluggish for questions. Requires frequent cues for safety and for use of RW. patient required NARCAn yesterday.      Exercises Total Joint Exercises Ankle Circles/Pumps: AROM;Right;Left;10 reps Quad Sets: AROM;Right;10 reps Heel Slides: AAROM;Right;10 reps;Supine Hip ABduction/ADduction: AAROM;Right;10 reps;Supine Straight Leg Raises: AAROM;Right;10 reps;Supine    General Comments        Pertinent Vitals/Pain Pain Assessment: Faces Faces Pain Scale: Hurts little more Pain Location: right knee Pain Descriptors / Indicators: Discomfort;Grimacing Pain Intervention(s): Premedicated before session;Monitored during session;Ice applied    Home Living Family/patient expects to be discharged to:: Private residence Living Arrangements: Spouse/significant other;Children Available Help at Discharge: Family Type of Home: House Home Access: Ramped entrance;Stairs to enter   Home Layout: One level Home Equipment: Environmental consultant - 2  wheels;Cane - single point;Shower seat;Bedside commode;Wheelchair - manual      Prior Function Level of Independence: Independent with assistive device(s)          PT Goals (current goals can now be found in the care plan section) Acute Rehab PT Goals Patient Stated Goal: to go home PT Goal Formulation: With patient/family Time For Goal Achievement: 01/04/18 Potential to Achieve Goals: Good Progress towards PT goals: Progressing toward goals     Frequency    7X/week      PT Plan Current plan remains appropriate    Co-evaluation              AM-PAC PT "6 Clicks" Daily Activity  Outcome Measure  Difficulty turning over in bed (including adjusting bedclothes, sheets and blankets)?: Unable Difficulty moving from lying on back to sitting on the side of the bed? : Unable Difficulty sitting down on and standing up from a chair with arms (e.g., wheelchair, bedside commode, etc,.)?: Unable Help needed moving to and from a bed to chair (including a wheelchair)?: A Lot Help needed walking in hospital room?: A Lot Help needed climbing 3-5 steps with a railing? : Total 6 Click Score: 8    End of Session Equipment Utilized During Treatment: Right knee immobilizer Activity Tolerance: Patient tolerated treatment well Patient left: in bed;with call bell/phone within reach;with bed alarm set;with family/visitor present Nurse Communication: Mobility status(wheezes) PT Visit Diagnosis: Unsteadiness on feet (R26.81);Pain Pain - Right/Left: Right Pain - part of body: Knee     Time: 1315-1346 PT Time Calculation (min) (ACUTE ONLY): 31 min  Charges:  $Gait Training: 23-37 mins                     Blanchard Kelch PT Acute Rehabilitation Services Pager [phone] Office [phone]    Rada Hay 01/01/2018, 3:23 PM

## 2018-01-01 NOTE — Care Management Note (Signed)
Case Management Note  Patient Details  Name: Marilyn Sexton MRN: [id number] Date of Birth: February 23, 1946  Subjective/Objective:  72 yo F, s/p R TKA                  Action/Plan: HH and DME   Expected Discharge Date:    01/02/18              Expected Discharge Plan:  [place]  In-House Referral:     Discharge planning Services  CM Consult  Post Acute Care Choice:  Home Health Choice offered to:  Patient, Spouse  DME Arranged:    DME Agency:     HH Arranged:  PT [place]:  [facility] (now [facility])  Status of Service:  Completed, signed off  If discussed at [employer] of Stay Meetings, dates discussed:    Additional Comments: Met with pt and husband. Pt plans to return home with the support of her husband. She has a cane and a RW. Discussed preference for a [facility] agency. Informed them that the physician's office arranged the [facility] PT with [facility] prior to the surgery and they agreed with the agency.  Norina Buzzard, RN 01/01/2018, 5:36 PM

## 2018-01-02 ENCOUNTER — Inpatient Hospital Stay (HOSPITAL_COMMUNITY): Payer: Medicare Other

## 2018-01-02 ENCOUNTER — Encounter (HOSPITAL_COMMUNITY): Payer: Self-pay | Admitting: *Deleted

## 2018-01-02 DIAGNOSIS — N179 Acute kidney failure, unspecified: Secondary | ICD-10-CM

## 2018-01-02 DIAGNOSIS — E119 Type 2 diabetes mellitus without complications: Secondary | ICD-10-CM

## 2018-01-02 DIAGNOSIS — J441 Chronic obstructive pulmonary disease with (acute) exacerbation: Secondary | ICD-10-CM

## 2018-01-02 DIAGNOSIS — Z794 Long term (current) use of insulin: Secondary | ICD-10-CM

## 2018-01-02 DIAGNOSIS — E871 Hypo-osmolality and hyponatremia: Secondary | ICD-10-CM

## 2018-01-02 LAB — BASIC METABOLIC PANEL
Anion gap: 13 (ref 5–15)
Anion gap: 12 (ref 5–15)
Anion gap: 10 (ref 5–15)
Anion gap: 9 (ref 5–15)
BUN: 23 mg/dL (ref 8–23)
BUN: 26 mg/dL — AB (ref 8–23)
BUN: 24 mg/dL — AB (ref 8–23)
BUN: 22 mg/dL (ref 8–23)
CHLORIDE: 84 mmol/L — AB (ref 98–111)
CHLORIDE: 86 mmol/L — AB (ref 98–111)
CHLORIDE: 86 mmol/L — AB (ref 98–111)
CO2: 28 mmol/L (ref 22–32)
CO2: 27 mmol/L (ref 22–32)
CO2: 28 mmol/L (ref 22–32)
CO2: 28 mmol/L (ref 22–32)
CREATININE: 1.09 mg/dL — AB (ref 0.44–1.00)
CREATININE: 1.07 mg/dL — AB (ref 0.44–1.00)
CREATININE: 0.97 mg/dL (ref 0.44–1.00)
Calcium: 8.6 mg/dL — ABNORMAL LOW (ref 8.9–10.3)
Calcium: 8.4 mg/dL — ABNORMAL LOW (ref 8.9–10.3)
Calcium: 8.7 mg/dL — ABNORMAL LOW (ref 8.9–10.3)
Calcium: 8.3 mg/dL — ABNORMAL LOW (ref 8.9–10.3)
Chloride: 82 mmol/L — ABNORMAL LOW (ref 98–111)
Creatinine, Ser: 1.16 mg/dL — ABNORMAL HIGH (ref 0.44–1.00)
GFR calc Af Amer: 54 mL/min — ABNORMAL LOW (ref >60)
GFR calc Af Amer: 58 mL/min — ABNORMAL LOW (ref >60)
GFR calc Af Amer: 60 mL/min — ABNORMAL LOW (ref >60)
GFR calc Af Amer: >60 mL/min (ref >60)
GFR calc non Af Amer: 50 mL/min — ABNORMAL LOW (ref >60)
GFR calc non Af Amer: 51 mL/min — ABNORMAL LOW (ref >60)
GFR calc non Af Amer: 58 mL/min — ABNORMAL LOW (ref >60)
GFR, EST NON AFRICAN AMERICAN: 47 mL/min — AB (ref >60)
GLUCOSE: 159 mg/dL — AB (ref 70–99)
Glucose, Bld: 232 mg/dL — ABNORMAL HIGH (ref 70–99)
Glucose, Bld: 216 mg/dL — ABNORMAL HIGH (ref 70–99)
Glucose, Bld: 167 mg/dL — ABNORMAL HIGH (ref 70–99)
POTASSIUM: 3.8 mmol/L (ref 3.5–5.1)
POTASSIUM: 5.2 mmol/L — AB (ref 3.5–5.1)
POTASSIUM: 4.4 mmol/L (ref 3.5–5.1)
Potassium: 4.5 mmol/L (ref 3.5–5.1)
Sodium: 123 mmol/L — ABNORMAL LOW (ref 135–145)
Sodium: 123 mmol/L — ABNORMAL LOW (ref 135–145)
Sodium: 124 mmol/L — ABNORMAL LOW (ref 135–145)
Sodium: 123 mmol/L — ABNORMAL LOW (ref 135–145)

## 2018-01-02 LAB — CBC
HEMATOCRIT: 26.6 % — AB (ref 36.0–46.0)
HEMATOCRIT: 28.6 % — AB (ref 36.0–46.0)
Hemoglobin: 9 g/dL — ABNORMAL LOW (ref 12.0–15.0)
Hemoglobin: 9.7 g/dL — ABNORMAL LOW (ref 12.0–15.0)
MCH: 34 pg (ref 26.0–34.0)
MCH: 33.9 pg (ref 26.0–34.0)
MCHC: 33.8 g/dL (ref 30.0–36.0)
MCHC: 33.9 g/dL (ref 30.0–36.0)
MCV: 100.4 fL — AB (ref 80.0–100.0)
MCV: 100 fL (ref 80.0–100.0)
NRBC: 0 % (ref 0.0–0.2)
PLATELETS: 253 10*3/uL (ref 150–400)
PLATELETS: 275 10*3/uL (ref 150–400)
RBC: 2.65 MIL/uL — AB (ref 3.87–5.11)
RBC: 2.86 MIL/uL — AB (ref 3.87–5.11)
RDW: 15.5 % (ref 11.5–15.5)
RDW: 15.5 % (ref 11.5–15.5)
WBC: 12 10*3/uL — ABNORMAL HIGH (ref 4.0–10.5)
WBC: 11.2 10*3/uL — ABNORMAL HIGH (ref 4.0–10.5)
nRBC: 0 % (ref 0.0–0.2)

## 2018-01-02 LAB — BLOOD GAS, ARTERIAL
ACID-BASE EXCESS: 5.3 mmol/L — AB (ref 0.0–2.0)
BICARBONATE: 29.4 mmol/L — AB (ref 20.0–28.0)
Drawn by: 331471
FIO2: 21
O2 SAT: 91.9 %
PCO2 ART: 43.4 mmHg (ref 32.0–48.0)
PO2 ART: 70 mmHg — AB (ref 83.0–108.0)
Patient temperature: 98.6
pH, Arterial: 7.446 (ref 7.350–7.450)

## 2018-01-02 LAB — GLUCOSE, CAPILLARY
GLUCOSE-CAPILLARY: 188 mg/dL — AB (ref 70–99)
Glucose-Capillary: 215 mg/dL — ABNORMAL HIGH (ref 70–99)
Glucose-Capillary: 234 mg/dL — ABNORMAL HIGH (ref 70–99)
Glucose-Capillary: 209 mg/dL — ABNORMAL HIGH (ref 70–99)
Glucose-Capillary: 143 mg/dL — ABNORMAL HIGH (ref 70–99)

## 2018-01-02 LAB — CBC WITH DIFFERENTIAL/PLATELET
ABS IMMATURE GRANULOCYTES: 0.13 10*3/uL — AB (ref 0.00–0.07)
BASOS PCT: 0 %
Basophils Absolute: 0 10*3/uL (ref 0.0–0.1)
Eosinophils Absolute: 0 10*3/uL (ref 0.0–0.5)
Eosinophils Relative: 0 %
HCT: 25.7 % — ABNORMAL LOW (ref 36.0–46.0)
Hemoglobin: 8.8 g/dL — ABNORMAL LOW (ref 12.0–15.0)
IMMATURE GRANULOCYTES: 1 %
Lymphocytes Relative: 5 %
Lymphs Abs: 0.6 10*3/uL — ABNORMAL LOW (ref 0.7–4.0)
MCH: 34.2 pg — AB (ref 26.0–34.0)
MCHC: 34.2 g/dL (ref 30.0–36.0)
MCV: 100 fL (ref 80.0–100.0)
MONO ABS: 0.6 10*3/uL (ref 0.1–1.0)
Monocytes Relative: 5 %
NEUTROS ABS: 11.7 10*3/uL — AB (ref 1.7–7.7)
NEUTROS PCT: 89 %
PLATELETS: 245 10*3/uL (ref 150–400)
RBC: 2.57 MIL/uL — AB (ref 3.87–5.11)
RDW: 15.5 % (ref 11.5–15.5)
WBC: 13.1 10*3/uL — ABNORMAL HIGH (ref 4.0–10.5)
nRBC: 0 % (ref 0.0–0.2)

## 2018-01-02 LAB — TROPONIN I

## 2018-01-02 LAB — SODIUM, URINE, RANDOM: SODIUM UR: 37 mmol/L

## 2018-01-02 LAB — CREATININE, URINE, RANDOM: CREATININE, URINE: 60.9 mg/dL

## 2018-01-02 LAB — MRSA PCR SCREENING: MRSA by PCR: NEGATIVE

## 2018-01-02 MED ORDER — SODIUM CHLORIDE 0.9 % IV SOLN
100.0000 mg | Freq: Two times a day (BID) | INTRAVENOUS | Status: DC
  Administered 2018-01-02 (×2): 100 mg via INTRAVENOUS
  Filled 2018-01-02 (×3): qty 100

## 2018-01-02 MED ORDER — SODIUM CHLORIDE 0.9 % IV BOLUS: 1000.0000 mL | Freq: Once | INTRAVENOUS | Status: DC

## 2018-01-02 MED ORDER — METHYLPREDNISOLONE SODIUM SUCC 40 MG IJ SOLR
40.0000 mg | Freq: Two times a day (BID) | INTRAMUSCULAR | Status: DC
  Administered 2018-01-02 (×2): 40 mg via INTRAVENOUS
  Filled 2018-01-02 (×2): qty 1

## 2018-01-02 MED ORDER — IPRATROPIUM-ALBUTEROL 0.5-2.5 (3) MG/3ML IN SOLN
3.0000 mL | Freq: Two times a day (BID) | RESPIRATORY_TRACT | Status: DC
  Administered 2018-01-02 (×2): 3 mL via RESPIRATORY_TRACT
  Filled 2018-01-02 (×2): qty 3

## 2018-01-02 MED ORDER — SODIUM CHLORIDE 0.9 % IV BOLUS
500.0000 mL | Freq: Once | INTRAVENOUS | Status: AC
  Administered 2018-01-02: 500 mL via INTRAVENOUS

## 2018-01-02 MED ORDER — SODIUM CHLORIDE 0.9 % IV SOLN
INTRAVENOUS | Status: DC
  Administered 2018-01-02: 20:00:00 via INTRAVENOUS

## 2018-01-02 NOTE — Progress Notes (Signed)
[facility] PROGRESS NOTE    Progress Note  Marilyn Sexton  ZOX:[id number] DOB: [birth year] DOA: 12/31/2017 PCP: Adrian Prince, MD     Brief Narrative:   Marilyn Sexton is an 72 y.o. female past medical history of acute coronary syndrome, mild COPD bipolar disorder type II hypertension admitted for right knee pain who is status post knee arthroplasty, the patient started complaining today of wheezing shortness of breath, with productive sputum she has a 23-year pack history of smoking  Assessment/Plan:   COPD with acute exacerbation (HCC) Still with diffuse wheezing on physical exam placed on inhalers. We will start on IV doxycycline and steroids. Continue oxygen as needed. I think she needs 1 more day of IV medication.  Steroids and antibiotics.  Continue inhalers. Wheezing of significantly on physical exam. She relates her sputum production is improved.  Primary osteoarthritis of right knee Management per Ortho.  Bipolar affective disorder, manic, moderate (HCC) Continue current home medications.  Hyponatremia Worsening hyponatremia in the setting of IV Lasix. We will hold Lasix repeat a basic metabolic panel in the morning. She is below her normal weight, she usually weighs around 9900 kg.  This morning she is 97.2 Liberalize diuretic therapy liberalize her diet. Check her sodium and urinary creatinine  AKI (acute kidney injury) (HCC) Check urinary sodium and urinary creatinine. Her creatinine has been improving.  She is about a liter negative. Appears to be euvolemic check orthostatics.  Type 2 diabetes mellitus without complication (HCC) Good control.    DVT prophylaxis: lovenox Family Communication:none Disposition Plan/Barrier to D/C: home in am Code Status:     Code Status Orders  (From admission, onward)         Start     Ordered   12/31/17 1213  Full code  Continuous     12/31/17 1212        Code Status History    Date Active  Date Inactive Code Status Order ID Comments User Context   08/15/2017 2319 08/16/2017 2202 Full Code [id number]  Rinaldo Cloud, MD Inpatient   07/20/2014 1940 07/23/2014 1912 Full Code [id number]  Derwood Kaplan, MD ED   07/20/2014 0010 07/20/2014 1940 Full Code [id number]  Elson Areas, PA-C ED    Advance Directive Documentation     Most Recent Value  Type of Advance Directive  Healthcare Power of Attorney, Living will  Pre-existing out of facility DNR order (yellow form or pink MOST form)  -  "MOST" Form in Place?  -        IV Access:    Peripheral IV   Procedures and diagnostic studies:   Dg Chest 1 View  Result Date: 01/01/2018 CLINICAL DATA:  Cough, wheezing, shortness of breath EXAM: CHEST  1 VIEW COMPARISON:  12/22/2017 FINDINGS: Mild left basilar opacity, likely atelectasis. Right lung is clear. No pleural effusion or pneumothorax. The heart is normal in size. IMPRESSION: No evidence of acute cardiopulmonary disease. Electronically Signed   By: Charline Bills M.D.   On: 01/01/2018 16:22     Medical Consultants:    None.  Anti-Infectives:   Doxy  Subjective:    Baron Sane she relates her breathing is significantly improved.  Objective:    Vitals:   01/01/18 1729 01/01/18 1946 01/01/18 2155 01/02/18 0556  BP: (!) 162/74  (!) 157/57 (!) 152/73  Pulse: 76  75 73  Resp: 16  16 18   Temp: 97.8 F (36.6 C)  98.3 F (36.8 C) 98.5 F (36.9  C)  TempSrc: Oral  Oral Oral  SpO2: 96% 94% 96% 93%  Weight:      Height:        Intake/Output Summary (Last 24 hours) at 01/02/2018 0739 Last data filed at 01/02/2018 0556 Gross per 24 hour  Intake 2011.6 ml  Output 2750 ml  Net -738.4 ml   Filed Weights   12/31/17 0612 12/31/17 1201  Weight: 97.2 kg 97.2 kg    Exam: General exam: In no acute distress. Respiratory system: Good air movement and clear to auscultation. Cardiovascular system: S1 & S2 heard, RRR.  No JVD on physical exam Gastrointestinal  system: Abdomen is nondistended, soft and nontender.  Central nervous system: Alert and oriented. No focal neurological deficits. Extremities: No pedal edema. Skin: No rashes, lesions or ulcers Psychiatry: Judgement and insight appear normal. Mood & affect appropriate.    Data Reviewed:    Labs: Basic Metabolic Panel: Recent Labs  Lab 01/01/18 0322 01/02/18 0410  NA 129* 123*  K 4.5 3.8  CL 88* 82*  CO2 29 28  GLUCOSE 186* 159*  BUN 23 23  CREATININE 1.32* 1.16*  CALCIUM 8.9 8.6*   GFR Estimated Creatinine Clearance: 52.1 mL/min (A) (by C-G formula based on SCr of 1.16 mg/dL (H)). Liver Function Tests: No results for input(s): AST, ALT, ALKPHOS, BILITOT, PROT, ALBUMIN in the last 168 hours. No results for input(s): LIPASE, AMYLASE in the last 168 hours. No results for input(s): AMMONIA in the last 168 hours. Coagulation profile No results for input(s): INR, PROTIME in the last 168 hours.  CBC: Recent Labs  Lab 01/01/18 0322 01/02/18 0410  WBC 13.8* 12.0*  HGB 9.7* 9.0*  HCT 29.4* 26.6*  MCV 101.0* 100.4*  PLT 270 253   Cardiac Enzymes: No results for input(s): CKTOTAL, CKMB, CKMBINDEX, TROPONINI in the last 168 hours. BNP (last 3 results) No results for input(s): PROBNP in the last 8760 hours. CBG: Recent Labs  Lab 12/31/17 2344 01/01/18 0735 01/01/18 1203 01/01/18 1726 01/01/18 2201  GLUCAP 167* 197* 172* 148* 161*   D-Dimer: No results for input(s): DDIMER in the last 72 hours. Hgb A1c: No results for input(s): HGBA1C in the last 72 hours. Lipid Profile: No results for input(s): CHOL, HDL, LDLCALC, TRIG, CHOLHDL, LDLDIRECT in the last 72 hours. Thyroid function studies: No results for input(s): TSH, T4TOTAL, T3FREE, THYROIDAB in the last 72 hours.  Invalid input(s): FREET3 Anemia work up: No results for input(s): VITAMINB12, FOLATE, FERRITIN, TIBC, IRON, RETICCTPCT in the last 72 hours. Sepsis Labs: Recent Labs  Lab 01/01/18 0322  01/02/18 0410  WBC 13.8* 12.0*   Microbiology No results found for this or any previous visit (from the past 240 hour(s)).   Medications:   . amLODipine  5 mg Oral Daily  . aspirin EC  325 mg Oral BID PC  . atorvastatin  40 mg Oral q1800  . benztropine  0.5 mg Oral BID  . celecoxib  200 mg Oral BID  . docusate sodium  100 mg Oral BID  . furosemide  20 mg Oral Daily  . gabapentin  300 mg Oral BID  . insulin aspart  0-15 Units Subcutaneous TID WC  . insulin aspart  5 Units Subcutaneous BID  . ipratropium-albuterol  3 mL Nebulization BID  . levothyroxine  75 mcg Oral QAC breakfast  . methylPREDNISolone (SOLU-MEDROL) injection  40 mg Intravenous Q12H  . metoprolol tartrate  50 mg Oral BID  . mirabegron ER  50 mg Oral Daily  .  QUEtiapine  25 mg Oral QHS  . risperiDONE  1 mg Oral BID   Continuous Infusions: . sodium chloride    . doxycycline (VIBRAMYCIN) IV    . methocarbamol (ROBAXIN) IV Stopped (12/31/17 1053)     LOS: 2 days   [name]  [facility] Pager [phone]  *Please refer to [email or web address], password [email or web address] to get updated schedule on who will round on this patient, as hospitalists switch teams weekly. If 7PM-7AM, please contact night-coverage at [email or web address], password [email or web address] for any overnight needs.  01/02/2018, 7:39 AM

## 2018-01-02 NOTE — Significant Event (Signed)
Rapid Response Event Note  Overview: Time Called: 1205 Arrival Time: 1300 Event Type: Neurologic Bedside RN called in regards to having a change in mental status. Upon arrival to room, patient was sitting in chair. Patient did not arouse initially to voice. Had to apply sternal rub to get a response from patient. Patient had not had any narcotics over the last 24 hrs. Patient had tramadol tablet 50mg  at 1803 on 10/26.   Initial Focused Assessment: Neuro: Obtunded, unable to arouse with voice, responded to sternal rub, started to follow commands after sternal rub. Patient able to tell name and place but not able to tell month or date correctly. No evidence of pain at this time.  Cardiac: NSR, BP 141/64 (88), pulses 1-2+ throughout all radial and pedal locations.  Pulmonary: Patient's breath sounds were clear and diminished throughout all lung fields. RR 12-14 per minute, 93-96% on RA.   Interventions: Bedside RN notified MD Robb Matar and MD Elson Clan with MD Turner Daniels in regards to patient being very lethargic, MD Turner Daniels ordered ABG for patient. MD Robb Matar put trx order to SDU MD Robb Matar ordered to give 1 L bolus  Plan of Care (if not transferred):  Event Summary:   at      at          [facility]

## 2018-01-02 NOTE — Progress Notes (Signed)
Physical Therapy Treatment Patient Details Name: Marilyn Sexton MRN: [id number] DOB: 1945-12-04 Today's Date: 01/02/2018    History of Present Illness RTKA. H/O DM and MI, Required Narcan 10/25 PM.    PT Comments    Pt with increased lethargy this am and requiring increased time and significant assist of 2 for performance of all mobility tasks.  In current state, pt would benefit from follow up rehab at SNF level to maximize IND and safety prior to return home with spouse.   Follow Up Recommendations  SNF;Follow surgeon's recommendation for DC plan and follow-up therapies     Equipment Recommendations  None recommended by PT    Recommendations for Other Services OT consult     Precautions / Restrictions Precautions Precautions: Knee;Fall Required Braces or Orthoses: Knee Immobilizer - Right Knee Immobilizer - Right: Discontinue once straight leg raise with < 10 degree lag Restrictions Weight Bearing Restrictions: No    Mobility  Bed Mobility Overal bed mobility: Needs Assistance Bed Mobility: Supine to Sit     Supine to sit: Mod assist;+2 for physical assistance     General bed mobility comments: assist to move LEs off bed and assist to lift trunk   Transfers Overall transfer level: Needs assistance Equipment used: Rolling walker (2 wheeled) Transfers: Sit to/from [employer] Sit to Stand: Mod assist;+2 physical assistance;+2 safety/equipment Stand pivot transfers: Mod assist;+2 physical assistance;+2 safety/equipment       General transfer comment: cues for hand placement, assist to boost into standing, assist for balance and maneuvering RW   Ambulation/Gait Ambulation/Gait assistance: Min assist;+2 safety/equipment Gait Distance (Feet): 95 Feet Assistive device: Rolling walker (2 wheeled) Gait Pattern/deviations: Step-to pattern;Step-through pattern;Shuffle;Decreased step length - right;Decreased step length - left;Trunk flexed Gait  velocity: decr   General Gait Details: frequent cues for position inside RW, gait sequence  posture, tends to roll Rw forward and move to festinating type gait requiring assist to recover   Stairs             Wheelchair Mobility    Modified Rankin (Stroke Patients Only)       Balance Overall balance assessment: Needs assistance Sitting-balance support: Feet supported;Single extremity supported Sitting balance-Leahy Scale: Poor Sitting balance - Comments: heavy Rt lateral lean.  Required min A - mod A to maintain EOB sitting  Postural control: Right lateral lean Standing balance support: Bilateral upper extremity supported Standing balance-Leahy Scale: Poor Standing balance comment: Requires min - mod A +2 for safety                             Cognition Arousal/Alertness: Lethargic Behavior During Therapy: Flat affect Overall Cognitive Status: Impaired/Different from baseline Area of Impairment: Orientation;Attention;Following commands;Safety/judgement;Problem solving                 Orientation Level: Disoriented to;Time Current Attention Level: Focused;Sustained   Following Commands: Follows one step commands consistently Safety/Judgement: Decreased awareness of safety   Problem Solving: Slow processing;Decreased initiation;Difficulty sequencing;Requires verbal cues;Requires tactile cues General Comments: Pt lethargic and trouble fully arousing.  She was disoriented to time thought it the first part of Oct., and "1918".  She fluctuated between focused and sustained attention, requires max cues for safety       Exercises      General Comments General comments (skin integrity, edema, etc.): spouse present      Pertinent Vitals/Pain Pain Assessment: Faces Faces Pain Scale: Hurts a little bit  Pain Location: right knee Pain Descriptors / Indicators: Grimacing Pain Intervention(s): Limited activity within patient's tolerance;Monitored during  session;Ice applied    Home Living Family/patient expects to be discharged to:: Private residence Living Arrangements: Spouse/significant other;Children Available Help at Discharge: Family Type of Home: House Home Access: Ramped entrance;Stairs to enter   Home Layout: One level Home Equipment: Environmental consultant - 2 wheels;Cane - single point;Shower seat;Bedside commode;Wheelchair - manual;Grab bars - tub/shower      Prior Function Level of Independence: Independent with assistive device(s)          PT Goals (current goals can now be found in the care plan section) Acute Rehab PT Goals Patient Stated Goal: for her to be able to regain independence  PT Goal Formulation: With patient/family Time For Goal Achievement: 01/04/18 Potential to Achieve Goals: Good Progress towards PT goals: Progressing toward goals    Frequency    7X/week      PT Plan Discharge plan needs to be updated    Co-evaluation              AM-PAC PT "6 Clicks" Daily Activity  Outcome Measure  Difficulty turning over in bed (including adjusting bedclothes, sheets and blankets)?: Unable Difficulty moving from lying on back to sitting on the side of the bed? : Unable Difficulty sitting down on and standing up from a chair with arms (e.g., wheelchair, bedside commode, etc,.)?: Unable Help needed moving to and from a bed to chair (including a wheelchair)?: A Lot Help needed walking in hospital room?: A Lot Help needed climbing 3-5 steps with a railing? : Total 6 Click Score: 8    End of Session Equipment Utilized During Treatment: Right knee immobilizer Activity Tolerance: Patient limited by fatigue;Patient limited by lethargy Patient left: in chair;with call bell/phone within reach;with family/visitor present;with chair alarm set Nurse Communication: Mobility status PT Visit Diagnosis: Unsteadiness on feet (R26.81);Pain;Difficulty in walking, not elsewhere classified (R26.2) Pain - Right/Left: Right Pain  - part of body: Knee     Time: 1010-1048 PT Time Calculation (min) (ACUTE ONLY): 38 min  Charges:  $Gait Training: 8-22 mins                     Mauro Kaufmann PT Acute Rehabilitation Services Pager [phone] Office [phone]    Skylene Deremer 01/02/2018, 12:58 PM

## 2018-01-02 NOTE — Progress Notes (Signed)
PATIENT ID: Marilyn Sexton  MRN: [id number]  DOB/AGE:  1945/04/30 / 72 y.o.  2 Days Post-Op Procedure(s) (LRB): RIGHT TOTAL KNEE ARTHROPLASTY (Right)    PROGRESS NOTE Subjective: Patient is alert, oriented, no Nausea, no Vomiting, yes passing gas. Taking PO well. Denies Chest or Calf Pain. Using Incentive Spirometer, PAS in place.  Patient had wheezing yesterday with a little tightness in the chest.  X-ray was ordered showing some atelectasis.  Patient was seen by Dr. Ashley Royalty of the hospitalist service and given nebulizer treatments which have helped. Ambulate 90', Patient reports pain as 4/10 .    Objective: Vital signs in last 24 hours: Vitals:   01/01/18 1729 01/01/18 1946 01/01/18 2155 01/02/18 0556  BP: (Abnormal) 162/74  (Abnormal) 157/57 (Abnormal) 152/73  Pulse: 76  75 73  Resp: 16  16 18   Temp: 97.8 F (36.6 C)  98.3 F (36.8 C) 98.5 F (36.9 C)  TempSrc: Oral  Oral Oral  SpO2: 96% 94% 96% 93%  Weight:      Height:          Intake/Output from previous day: I/O last 3 completed shifts: In: 3514.6 [P.O.:1500; I.V.:1964.6; IV Piggyback:50] Out: 3100 [Urine:3100]   Intake/Output this shift: No intake/output data recorded.   LABORATORY DATA: Recent Labs    01/01/18 0322  01/01/18 1203 01/01/18 1726 01/01/18 2201 01/02/18 0410  WBC 13.8*  --   --   --   --  12.0*  HGB 9.7*  --   --   --   --  9.0*  HCT 29.4*  --   --   --   --  26.6*  PLT 270  --   --   --   --  253  NA 129*  --   --   --   --  123*  K 4.5  --   --   --   --  3.8  CL 88*  --   --   --   --  82*  CO2 29  --   --   --   --  28  BUN 23  --   --   --   --  23  CREATININE 1.32*  --   --   --   --  1.16*  GLUCOSE 186*  --   --   --   --  159*  GLUCAP  --    < > 172* 148* 161*  --   CALCIUM 8.9  --   --   --   --  8.6*   < > = values in this interval not displayed.    Examination: Neurologically intact ABD soft Neurovascular intact Sensation intact distally Intact pulses  distally Dorsiflexion/Plantar flexion intact Incision: dressing C/D/I No cellulitis present Compartment soft} Able to hold straight leg raise today but not initiate on her own  Assessment:   2 Days Post-Op Procedure(s) (LRB): RIGHT TOTAL KNEE ARTHROPLASTY (Right) ADDITIONAL DIAGNOSIS: Expected Acute Blood Loss Anemia, COPD, history of coronary artery syndrome Anticipated LOS equal to or greater than 2 midnights due to - Age 28 and older with one or more of the following:  - Obesity  - Expected need for hospital services (PT, OT, Nursing) required for safe  discharge  - Anticipated need for postoperative skilled nursing care or inpatient rehab Significant bipolar disease, COPD, slow progress with physical therapy.     Plan: PT/OT WBAT, AROM and PROM  DVT Prophylaxis:  SCDx72hrs, ASA 81  mg BID x 2 weeks DISCHARGE PLAN: Home, possibly today if she meets all physical therapy goals. DISCHARGE NEEDS: HHPT, Walker and 3-in-1 comode seat     Nestor Lewandowsky 01/02/2018, 7:04 AM

## 2018-01-02 NOTE — Progress Notes (Signed)
Transferred to SDU. CM/SW will continue follow-noted PT recc SNF;f/u surgeons recc;[facility] was following PTA,patient has dme.

## 2018-01-02 NOTE — Progress Notes (Signed)
Pt still drowsy, arouses to voice and stimuli, oriented times 3.  Very irritated at me trying to get her to wake up and take her medicines safely.  [place] NP notified of sodium level and will hold risperdal tonight.

## 2018-01-02 NOTE — Evaluation (Addendum)
Occupational Therapy Evaluation Patient Details Name: Marilyn Sexton MRN: [id number] DOB: [birth year] Today's Date: 01/02/2018    History of Present Illness RTKA. H/O DM and MI, Required Narcan 10/25 PM.   Clinical Impression   Pt admitted with above. She demonstrates the below listed deficits and will benefit from continued OT to maximize safety and independence with BADLs.  Pt seen in conjunction with PT.  Pt exceptionally lethargic.  She will arouse for brief periods, but has very flat affect, and engages minimally.  She requires max - total A for ADLs, mod A to maintain EOB sitting and mod A +2 for functional mobility.  She lives with spouse and was mod I PTA.  Unless, pt makes significant improvements in her ability to participate, and transfer, don't feel home discharge is a safe option.  IF she does discharge home, would max out HH services HHOT, PT, SW, aide, RN.  Will continue to follow pt acutely.  RN notified of pt performance.  BP Supine:               129/63,    HR 72, 02 95% Sitting:                 145/88,    HR 77 Standing:             134/62     HR 68 Standing 3 mins  134/58,    HR 78  After ambulation  129/60     HR 80, 02 100%       Follow Up Recommendations  SNF;Follow surgeon's recommendation for DC plan and follow-up therapies    Equipment Recommendations  None recommended by OT    Recommendations for Other Services       Precautions / Restrictions Precautions Precautions: Knee;Fall Required Braces or Orthoses: Knee Immobilizer - Right Knee Immobilizer - Right: Discontinue once straight leg raise with < 10 degree lag Restrictions Weight Bearing Restrictions: No      Mobility Bed Mobility Overal bed mobility: Needs Assistance Bed Mobility: Supine to Sit     Supine to sit: Mod assist;+2 for physical assistance     General bed mobility comments: assist to move LEs off bed and assist to lift trunk   Transfers Overall transfer level: Needs  assistance Equipment used: Rolling walker (2 wheeled) Transfers: Sit to/from [employer] Sit to Stand: Mod assist;+2 physical assistance;+2 safety/equipment Stand pivot transfers: Mod assist;+2 physical assistance;+2 safety/equipment       General transfer comment: cues for hand placement, assist to boost into standing, assist for balance and maneuvering RW     Balance Overall balance assessment: Needs assistance Sitting-balance support: Feet supported;Single extremity supported Sitting balance-Leahy Scale: Poor Sitting balance - Comments: heavy Rt lateral lean.  Required min A - mod A to maintain EOB sitting  Postural control: Right lateral lean Standing balance support: Bilateral upper extremity supported Standing balance-Leahy Scale: Poor Standing balance comment: Requires min - mod A +2 for safety                            ADL either performed or assessed with clinical judgement   ADL Overall ADL's : Needs assistance/impaired Eating/Feeding: Set up;Bed level Eating/Feeding Details (indicate cue type and reason): observed pt eating breakfast prior to OT eval  Grooming: Wash/dry hands;Wash/dry face;Moderate assistance;Sitting Grooming Details (indicate cue type and reason): heavy Rt lean while sitting EOB  Upper Body Bathing: Maximal assistance;Sitting  Lower Body Bathing: Total assistance;Sit to/from stand   Upper Body Dressing : Maximal assistance;Sitting   Lower Body Dressing: Total assistance;Sit to/from stand   Toilet Transfer: Moderate assistance;+2 for physical assistance;+2 for safety/equipment;Stand-pivot;BSC;RW   Toileting- Clothing Manipulation and Hygiene: Maximal assistance;Sit to/from stand       Functional mobility during ADLs: Moderate assistance;+2 for physical assistance;+2 for safety/equipment;Rolling walker       Vision Baseline Vision/History: No visual deficits Additional Comments: Pursuits [facility]      Perception  Perception Perception Tested?: Yes   Praxis Praxis Praxis tested?: Within functional limits    Pertinent Vitals/Pain Pain Assessment: Faces Faces Pain Scale: Hurts a little bit Pain Location: right knee Pain Descriptors / Indicators: Grimacing Pain Intervention(s): Monitored during session     Hand Dominance Right   Extremity/Trunk Assessment Upper Extremity Assessment Upper Extremity Assessment: Generalized weakness   Lower Extremity Assessment Lower Extremity Assessment: Defer to PT evaluation   Cervical / Trunk Assessment Cervical / Trunk Assessment: Normal   Communication Communication Communication: No difficulties   Cognition Arousal/Alertness: Lethargic Behavior During Therapy: Flat affect Overall Cognitive Status: Impaired/Different from baseline Area of Impairment: Orientation;Attention;Following commands;Safety/judgement;Problem solving                 Orientation Level: Disoriented to;Time Current Attention Level: Focused;Sustained   Following Commands: Follows one step commands consistently(with cues ) Safety/Judgement: Decreased awareness of safety   Problem Solving: Slow processing;Decreased initiation;Difficulty sequencing;Requires verbal cues;Requires tactile cues General Comments: Pt lethargic and trouble fully arousing.  She was disoriented to time thought it the first part of Oct., and "1918".  She fluctuated between focused and sustained attention, requires max cues for safety    General Comments  spouse present.      Exercises     Shoulder Instructions      Home Living Family/patient expects to be discharged to:: Private residence Living Arrangements: Spouse/significant other;Children Available Help at Discharge: Family Type of Home: House Home Access: Ramped entrance;Stairs to enter Entrance Stairs-Number of Steps: 1 at ramp   Home Layout: One level     Bathroom Shower/Tub: Producer, television/film/video: Standard      Home Equipment: Environmental consultant - 2 wheels;Cane - single point;Shower seat;Bedside commode;Wheelchair - manual;Grab bars - tub/shower          Prior Functioning/Environment Level of Independence: Independent with assistive device(s)                 OT Problem List: Decreased strength;Decreased activity tolerance;Impaired balance (sitting and/or standing);Decreased safety awareness;Decreased knowledge of use of DME or AE;Obesity;Pain;Decreased cognition      OT Treatment/Interventions: Self-care/ADL training;DME and/or AE instruction;Therapeutic activities;Patient/family education;Balance training;Cognitive remediation/compensation    OT Goals(Current goals can be found in the care plan section) Acute Rehab OT Goals Patient Stated Goal: for her to be able to regain independence  OT Goal Formulation: With family Time For Goal Achievement: 01/09/18 Potential to Achieve Goals: Good ADL Goals Pt Will Perform Grooming: (P) with set-up;sitting Pt Will Perform Upper Body Bathing: (P) with set-up;with supervision;sitting Pt Will Perform Lower Body Bathing: (P) with min assist;sit to/from stand;with adaptive equipment Pt Will Perform Lower Body Dressing: (P) with min assist;with adaptive equipment;sit to/from stand Pt Will Transfer to Toilet: (P) with min assist;ambulating;regular height toilet;bedside commode;grab bars Pt Will Perform Toileting - Clothing Manipulation and hygiene: (P) with min assist;sit to/from stand  OT Frequency: Min 2X/week   Barriers to D/C: Decreased caregiver support  unsure that pt can manage pt  at her current level of functioning        Co-evaluation              AM-PAC PT "6 Clicks" Daily Activity     Outcome Measure Help from another person eating meals?: None Help from another person taking care of personal grooming?: A Lot Help from another person toileting, which includes using toliet, bedpan, or urinal?: A Lot Help from another person bathing  (including washing, rinsing, drying)?: A Lot Help from another person to put on and taking off regular upper body clothing?: A Lot Help from another person to put on and taking off regular lower body clothing?: Total 6 Click Score: 13   End of Session Equipment Utilized During Treatment: Rolling walker CPM Right Knee CPM Right Knee: Off Nurse Communication: Mobility status;Other (comment)(MS and BP )  Activity Tolerance: Patient limited by lethargy Patient left: in chair;with call bell/phone within reach;with chair alarm set;with family/visitor present  OT Visit Diagnosis: Unsteadiness on feet (R26.81);Pain;Cognitive communication deficit (R41.841) Pain - Right/Left: Right Pain - part of body: Knee                Time: 1610-9604 OT Time Calculation (min): 37 min Charges:  OT General Charges $OT Visit: 1 Visit OT Evaluation $OT Eval Moderate Complexity: 1 Mod  Jeani Hawking, OTR/L Acute Rehabilitation Services Pager [phone] Office [phone]   Jeani Hawking M 01/02/2018, 11:35 AM

## 2018-01-02 NOTE — Progress Notes (Signed)
At 0900 I was notified by Iantha Fallen, RT that the pt's Husband stated to him "I liked it better when she overdosed because I didn't have to deal with her." I believe the Husband is referring to 12/31/17 POD #0 when the pt received Narcan x 3 after arriving to the unit from PACU per report.

## 2018-01-02 NOTE — Progress Notes (Addendum)
David Stall, MD was notified via page of the pt's increase in drowsiness. Upon assessment at 0815, the pt did know her name, birthday, what year it is, who the president is, and where she's at; however, the pt continued to fall asleep as I asked her these questions. The pt did not receive any pain meds last night nor did I give her anything for pain today. I did see that the pt received Seroquel at 2152 last night (01/02/18), which could cause drowsiness, but the pt has continued to experience drowsiness throughout breakfast and while working with PT. PT reported that the pt kept closing her eyes while walking. Pt also reported the pt stated the current year is 1819, 1920, etc. The pt is able to follow commands, but concentration is poor. Will continue to monitor.   At 1200 rapid response was called d/t further decline in mental status. Pt was unaware of the current month and year. Pt was sternal rubbed multiple times as she continued to fall asleep as I was reassessing her/asking her questions. Radonna Ricker Robb Matar was paged again d/t calling a rapid response. The on-call for [place] was notified of the rapid response as well.   David Stall, MD placed a tranfer order to stepdown.

## 2018-01-02 NOTE — Progress Notes (Signed)
Rt respond to RRT call. Rt obtained ABG per order. No distress noted. No further orders at this time. Pt pending to stepdown.

## 2018-01-03 LAB — BASIC METABOLIC PANEL
ANION GAP: 10 (ref 5–15)
Anion gap: 9 (ref 5–15)
Anion gap: 13 (ref 5–15)
BUN: 21 mg/dL (ref 8–23)
BUN: 21 mg/dL (ref 8–23)
BUN: 24 mg/dL — ABNORMAL HIGH (ref 8–23)
CHLORIDE: 89 mmol/L — AB (ref 98–111)
CHLORIDE: 88 mmol/L — AB (ref 98–111)
CO2: 28 mmol/L (ref 22–32)
CO2: 26 mmol/L (ref 22–32)
CO2: 24 mmol/L (ref 22–32)
Calcium: 8.4 mg/dL — ABNORMAL LOW (ref 8.9–10.3)
Calcium: 8.4 mg/dL — ABNORMAL LOW (ref 8.9–10.3)
Calcium: 8.6 mg/dL — ABNORMAL LOW (ref 8.9–10.3)
Chloride: 88 mmol/L — ABNORMAL LOW (ref 98–111)
Creatinine, Ser: 0.99 mg/dL (ref 0.44–1.00)
Creatinine, Ser: 1.11 mg/dL — ABNORMAL HIGH (ref 0.44–1.00)
Creatinine, Ser: 1.07 mg/dL — ABNORMAL HIGH (ref 0.44–1.00)
GFR calc Af Amer: >60 mL/min (ref >60)
GFR calc non Af Amer: 49 mL/min — ABNORMAL LOW (ref >60)
GFR calc non Af Amer: 51 mL/min — ABNORMAL LOW (ref >60)
GFR, EST AFRICAN AMERICAN: 57 mL/min — AB (ref >60)
GFR, EST AFRICAN AMERICAN: 60 mL/min — AB (ref >60)
GFR, EST NON AFRICAN AMERICAN: 56 mL/min — AB (ref >60)
Glucose, Bld: 228 mg/dL — ABNORMAL HIGH (ref 70–99)
Glucose, Bld: 193 mg/dL — ABNORMAL HIGH (ref 70–99)
Glucose, Bld: 226 mg/dL — ABNORMAL HIGH (ref 70–99)
POTASSIUM: 4.6 mmol/L (ref 3.5–5.1)
POTASSIUM: 4.9 mmol/L (ref 3.5–5.1)
Potassium: 4.6 mmol/L (ref 3.5–5.1)
Sodium: 125 mmol/L — ABNORMAL LOW (ref 135–145)
Sodium: 125 mmol/L — ABNORMAL LOW (ref 135–145)
Sodium: 125 mmol/L — ABNORMAL LOW (ref 135–145)

## 2018-01-03 LAB — CBC
HEMATOCRIT: 25.6 % — AB (ref 36.0–46.0)
Hemoglobin: 8.6 g/dL — ABNORMAL LOW (ref 12.0–15.0)
MCH: 33.7 pg (ref 26.0–34.0)
MCHC: 33.6 g/dL (ref 30.0–36.0)
MCV: 100.4 fL — AB (ref 80.0–100.0)
NRBC: 0 % (ref 0.0–0.2)
Platelets: 262 10*3/uL (ref 150–400)
RBC: 2.55 MIL/uL — ABNORMAL LOW (ref 3.87–5.11)
RDW: 15.2 % (ref 11.5–15.5)
WBC: 10.1 10*3/uL (ref 4.0–10.5)

## 2018-01-03 LAB — GLUCOSE, CAPILLARY
GLUCOSE-CAPILLARY: 202 mg/dL — AB (ref 70–99)
Glucose-Capillary: 168 mg/dL — ABNORMAL HIGH (ref 70–99)
Glucose-Capillary: 269 mg/dL — ABNORMAL HIGH (ref 70–99)
Glucose-Capillary: 182 mg/dL — ABNORMAL HIGH (ref 70–99)

## 2018-01-03 MED ORDER — INSULIN ASPART 100 UNIT/ML ~~LOC~~ SOLN
0.0000 [IU] | Freq: Three times a day (TID) | SUBCUTANEOUS | Status: DC
  Administered 2018-01-03: 5 [IU] via SUBCUTANEOUS
  Administered 2018-01-04: 2 [IU] via SUBCUTANEOUS

## 2018-01-03 MED ORDER — BENAZEPRIL HCL 10 MG PO TABS
20.0000 mg | ORAL_TABLET | Freq: Two times a day (BID) | ORAL | Status: DC
  Administered 2018-01-03 – 2018-01-04 (×3): 20 mg via ORAL
  Filled 2018-01-03 (×2): qty 2
  Filled 2018-01-03: qty 1

## 2018-01-03 MED ORDER — SODIUM CHLORIDE 0.9 % IV SOLN
INTRAVENOUS | Status: DC
  Administered 2018-01-03 (×2): via INTRAVENOUS

## 2018-01-03 MED ORDER — INSULIN ASPART 100 UNIT/ML ~~LOC~~ SOLN: 0.0000 [IU] | Freq: Every day | SUBCUTANEOUS | Status: DC

## 2018-01-03 MED ORDER — PREDNISONE 20 MG PO TABS
40.0000 mg | ORAL_TABLET | Freq: Every day | ORAL | Status: DC
  Administered 2018-01-04: 40 mg via ORAL
  Filled 2018-01-03 (×2): qty 2

## 2018-01-03 MED ORDER — PREDNISONE 20 MG PO TABS
50.0000 mg | ORAL_TABLET | Freq: Every day | ORAL | Status: DC
  Administered 2018-01-03: 50 mg via ORAL
  Filled 2018-01-03: qty 2

## 2018-01-03 MED ORDER — DOXYCYCLINE HYCLATE 100 MG PO TABS
100.0000 mg | ORAL_TABLET | Freq: Two times a day (BID) | ORAL | Status: DC
  Administered 2018-01-03 – 2018-01-04 (×3): 100 mg via ORAL
  Filled 2018-01-03 (×3): qty 1

## 2018-01-03 MED ORDER — INSULIN DETEMIR 100 UNIT/ML ~~LOC~~ SOLN
10.0000 [IU] | Freq: Two times a day (BID) | SUBCUTANEOUS | Status: DC
  Administered 2018-01-03 – 2018-01-04 (×3): 10 [IU] via SUBCUTANEOUS
  Filled 2018-01-03 (×3): qty 0.1

## 2018-01-03 MED ORDER — INSULIN ASPART 100 UNIT/ML ~~LOC~~ SOLN
6.0000 [IU] | Freq: Three times a day (TID) | SUBCUTANEOUS | Status: DC
  Administered 2018-01-03 – 2018-01-04 (×2): 6 [IU] via SUBCUTANEOUS

## 2018-01-03 NOTE — Care Plan (Signed)
Chart reviewed. Events noted. Will meet with patient and family to discuss discharge options once she is medically stable. Her original plan was to discharge to home. At this point, may need STSNF.   Shauna Hugh, RNCM  [phone]

## 2018-01-03 NOTE — Progress Notes (Signed)
Physical Therapy Treatment Patient Details Name: Marilyn Sexton MRN: [id number] DOB: [birth year] Today's Date: 01/03/2018    History of Present Illness RTKA. H/O DM and MI, Required Narcan 10/25 PM. To SDU for lethargy     PT Comments    Audible wheezes noted with effort  And ambulation. RN aware. Plans Dc tomorrow, hopefully. Continue PT   Follow Up Recommendations  Home health PT     Equipment Recommendations  None recommended by PT    Recommendations for Other Services       Precautions / Restrictions Precautions Precautions: Knee;Fall Precaution Comments: did not use KI    Mobility  Bed Mobility   Bed Mobility: Supine to Sit;Sit to Supine     Supine to sit: Min assist;Mod assist Sit to supine: Min assist   General bed mobility comments: patient neded assist forst time. had to get up again for bed change and required no assistance with GHOB raised.  Transfers   Equipment used: Agricultural consultant (2 wheeled) Transfers: Sit to/from [employer] to Stand: Min guard;From elevated surface Stand pivot transfers: Min assist       General transfer comment: cues for hand placement,  Ambulation/Gait Ambulation/Gait assistance: Min assist Gait Distance (Feet): 120 Feet Assistive device: Rolling walker (2 wheeled) Gait Pattern/deviations: Step-through pattern     General Gait Details: right leg tending to ext. rotate.     Stairs             Wheelchair Mobility    Modified Rankin (Stroke Patients Only)       Balance                                            Cognition Arousal/Alertness: Awake/alert                                            Exercises      General Comments        Pertinent Vitals/Pain Faces Pain Scale: Hurts little more Pain Location: right knee Pain Descriptors / Indicators: Grimacing Pain Intervention(s): Monitored during session;Premedicated before session;Ice  applied    Home Living                      Prior Function            PT Goals (current goals can now be found in the care plan section) Progress towards PT goals: Progressing toward goals    Frequency    7X/week      PT Plan Current plan remains appropriate    Co-evaluation              AM-PAC PT "6 Clicks" Daily Activity  Outcome Measure  Difficulty turning over in bed (including adjusting bedclothes, sheets and blankets)?: A Lot Difficulty moving from lying on back to sitting on the side of the bed? : A Lot Difficulty sitting down on and standing up from a chair with arms (e.g., wheelchair, bedside commode, etc,.)?: A Lot Help needed moving to and from a bed to chair (including a wheelchair)?: A Lot Help needed walking in hospital room?: A Lot Help needed climbing 3-5 steps with a railing? : Total 6 Click Score: 11    End of Session  Activity Tolerance: Patient tolerated treatment well Patient left: in chair;with call bell/phone within reach;with chair alarm set;with family/visitor present;with nursing/sitter in room Nurse Communication: Mobility status PT Visit Diagnosis: Unsteadiness on feet (R26.81);Pain;Difficulty in walking, not elsewhere classified (R26.2) Pain - Right/Left: Right Pain - part of body: Knee     Time: 9604-5409 PT Time Calculation (min) (ACUTE ONLY): 29 min  Charges:  $Gait Training: 23-37 mins                     Blanchard Kelch PT Acute Rehabilitation Services Pager [phone] Office [phone]    Rada Hay 01/03/2018, 5:57 PM

## 2018-01-03 NOTE — Care Management Note (Signed)
Case Management Note  Patient Details  Name: KARMAH POTOCKI MRN: [id number] Date of Birth: [birth year]  Subjective/Objective:                  Shortness of breath and wheezing copd exacerbation, in a smoker  Action/Plan: Will follow for progression of care and clinical status. Will follow for case management needs none present at this time.  Expected Discharge Date:                  Expected Discharge Plan:  Home w Home Health Services  In-House Referral:     Discharge planning Services  CM Consult  Post Acute Care Choice:  Home Health Choice offered to:  Patient, Spouse  DME Arranged:    DME Agency:     HH Arranged:  PT HH Agency:  [facility] (now [facility])  Status of Service:  Completed, signed off  If discussed at Long Length of Stay Meetings, dates discussed:    Additional Comments:  Golda Acre, RN 01/03/2018, 8:46 AM

## 2018-01-03 NOTE — Progress Notes (Signed)
Subjective: 3 Days Post-Op Procedure(s) (LRB): RIGHT TOTAL KNEE ARTHROPLASTY (Right) Patient reports pain as mild.  Patient doing much better today.  She is more alert and oriented.  Taking by mouth and voiding okay.  We appreciate the hospitalist's assistance with her care.  Objective: Vital signs in last 24 hours: Temp:  [98.4 F (36.9 C)-99.1 F (37.3 C)] 99 F (37.2 C) (10/28 1100) Pulse Rate:  [67-80] 68 (10/28 1100) Resp:  [14-20] 17 (10/28 1100) BP: (138-176)/(42-126) 146/90 (10/28 1100) SpO2:  [93 %-98 %] 96 % (10/28 0300) Weight:  [102.5 kg] 102.5 kg (10/27 1330)  Intake/Output from previous day: 10/27 0701 - 10/28 0700 In: 1650.5 [P.O.:440; I.V.:1196.3; IV Piggyback:14.2] Out: 4000 [Urine:4000] Intake/Output this shift: No intake/output data recorded.  Recent Labs    01/01/18 0322 01/02/18 0410 01/02/18 1316 01/02/18 1812 01/03/18 0515  HGB 9.7* 9.0* 8.8* 9.7* 8.6*   Recent Labs    01/02/18 1812 01/03/18 0515  WBC 11.2* 10.1  RBC 2.86* 2.55*  HCT 28.6* 25.6*  PLT 275 262   Recent Labs    01/03/18 0116 01/03/18 0515  NA 125* 125*  K 4.6 4.6  CL 88* 89*  CO2 28 26  BUN 21 21  CREATININE 0.99 1.11*  GLUCOSE 228* 193*  CALCIUM 8.4* 8.4*   No results for input(s): LABPT, INR in the last 72 hours. Right knee exam: Neurovascular intact Sensation intact distally Intact pulses distally Dorsiflexion/Plantar flexion intact Incision: dressing C/D/I Compartment soft  Anticipated LOS equal to or greater than 2 midnights due to - Age 70 and older with one or more of the following:  - Obesity  - Expected need for hospital services (PT, OT, Nursing) required for safe  discharge  - Anticipated need for postoperative skilled nursing care or inpatient rehab  - Active co-morbidities: Significant bipolar disease, COPD, hyponatremia, slow progress with physical therapy. OR   - Unanticipated findings during/Post Surgery: Slow post-op progression: GI, pain  control, mobility     Assessment/Plan: 3 Days Post-Op Procedure(s) (LRB): RIGHT TOTAL KNEE ARTHROPLASTY (Right) COPD with acute exacerbation.  Managed by medicine. Bipolar affective disorder. Hyponatremia.  Managed by medicine. Plan: [facility] hospitalist's help was greatly appreciated.  She seems to be doing much better today.  We will have her get up with physical therapy weightbearing as tolerated on the right lower extremity. Continue aspirin 325 mg twice daily for DVT prophylaxis.  If she does well with physical therapy she can be discharged home tomorrow with home health PT.    Matthew Folks 01/03/2018, 12:59 PM

## 2018-01-03 NOTE — Progress Notes (Signed)
[facility] PROGRESS NOTE    Progress Note  Marilyn Sexton  ZOX:[id number] DOB: 07/05/1945 DOA: 12/31/2017 PCP: Adrian Prince, MD     Brief Narrative:   Marilyn Sexton is an 72 y.o. female past medical history of acute coronary syndrome, mild COPD bipolar disorder type II hypertension admitted for right knee pain who is status post knee arthroplasty, the patient started complaining today of wheezing shortness of breath, with productive sputum she has a 23-year pack history of smoking  Assessment/Plan:   COPD with acute exacerbation (HCC) She is currently not wheezing on physical exam. Change IV doxy and IV steroids to oral. Continue inhalers.  Acute encephalopathy likely due to hyponatremia: In the setting of diuresis. Her IV Lasix was stopped, her urine still looks diluted after fluid hydration, her encephalopathy has resolved her sodium is slowly coming up. Her fractional excretion of sodium was less than 1 we will restart IV fluids gently. Continue monitor strict I's and O's her weight is up 202.5 kg.  Primary osteoarthritis of right knee Management per Ortho. Therapy to start working with the patient.  Bipolar affective disorder, manic, moderate (HCC) Continue current home medications.  AKI (acute kidney injury) (HCC) Fractional excretion of sodium is less than 1, her creatinine has returned to baseline with IV fluids. Likely prerenal azotemia.  Type 2 diabetes mellitus without complication (HCC) Good control.   DVT prophylaxis: lovenox Family Communication:none Disposition Plan/Barrier to D/C: Lorie Apley to MedSurg. Code Status:     Code Status Orders  (From admission, onward)         Start     Ordered   12/31/17 1213  Full code  Continuous     12/31/17 1212        Code Status History    Date Active Date Inactive Code Status Order ID Comments User Context   08/15/2017 2319 08/16/2017 2202 Full Code [id number]  Rinaldo Cloud, MD Inpatient   07/20/2014 1940 07/23/2014 1912 Full Code [id number]  Derwood Kaplan, MD ED   07/20/2014 0010 07/20/2014 1940 Full Code [id number]  Elson Areas, PA-C ED    Advance Directive Documentation     Most Recent Value  Type of Advance Directive  Healthcare Power of Attorney, Living will  Pre-existing out of facility DNR order (yellow form or pink MOST form)  -  "MOST" Form in Place?  -        IV Access:    Peripheral IV   Procedures and diagnostic studies:   Dg Chest 1 View  Result Date: 01/01/2018 CLINICAL DATA:  Cough, wheezing, shortness of breath EXAM: CHEST  1 VIEW COMPARISON:  12/22/2017 FINDINGS: Mild left basilar opacity, likely atelectasis. Right lung is clear. No pleural effusion or pneumothorax. The heart is normal in size. IMPRESSION: No evidence of acute cardiopulmonary disease. Electronically Signed   By: Charline Bills M.D.   On: 01/01/2018 16:22   Ct Head Wo Contrast  Result Date: 01/02/2018 CLINICAL DATA:  Altered mental status EXAM: CT HEAD WITHOUT CONTRAST TECHNIQUE: Contiguous axial images were obtained from the base of the skull through the vertex without intravenous contrast. COMPARISON:  07/20/2014 FINDINGS: Brain: There is no mass, hemorrhage or extra-axial collection. The size and configuration of the ventricles and extra-axial CSF spaces are normal. There is an old left basal ganglia lacunar infarct. There is hypoattenuation of the periventricular white matter, most commonly indicating chronic ischemic microangiopathy. Vascular: No abnormal hyperdensity of the major intracranial arteries or dural venous sinuses. No intracranial atherosclerosis.  Skull: The visualized skull base, calvarium and extracranial soft tissues are normal. Sinuses/Orbits: No fluid levels or advanced mucosal thickening of the visualized paranasal sinuses. No mastoid or middle ear effusion. The orbits are normal. IMPRESSION: Chronic microvascular ischemia without acute intracranial abnormality. Old  left basal ganglia small vessel infarct. Electronically Signed   By: Deatra Robinson M.D.   On: 01/02/2018 16:21     Medical Consultants:    None.  Anti-Infectives:   Doxy  Subjective:    Marilyn Sexton breathing much improved she relates she is close to baseline.  Objective:    Vitals:   01/03/18 0000 01/03/18 0100 01/03/18 0200 01/03/18 0300  BP: (!) 150/42 (!) 166/55 (!) 149/46 (!) 142/47  Pulse: 71 79 80 78  Resp: 18 19 17 17   Temp: 98.8 F (37.1 C) 99 F (37.2 C) 99.1 F (37.3 C) 99.1 F (37.3 C)  TempSrc:      SpO2: 93% 94% 95% 96%  Weight:      Height:        Intake/Output Summary (Last 24 hours) at 01/03/2018 0753 Last data filed at 01/03/2018 0600 Gross per 24 hour  Intake 1650.54 ml  Output 4000 ml  Net -2349.46 ml   Filed Weights   12/31/17 0612 12/31/17 1201 01/02/18 1330  Weight: 97.2 kg 97.2 kg 102.5 kg    Exam: General exam: In no acute distress. Respiratory system: Good air movement and clear to auscultation. Cardiovascular system: S1 & S2 heard, RRR.   Gastrointestinal system: Abdomen is nondistended, soft and nontender.  Central nervous system: Alert and oriented. No focal neurological deficits. Extremities: No pedal edema. Skin: No rashes, lesions or ulcers Psychiatry: Judgement and insight appear normal. Mood & affect appropriate.    Data Reviewed:    Labs: Basic Metabolic Panel: Recent Labs  Lab 01/02/18 1316 01/02/18 1644 01/02/18 2047 01/03/18 0116 01/03/18 0515  NA 123* 124* 123* 125* 125*  K 4.5 5.2* 4.4 4.6 4.6  CL 84* 86* 86* 88* 89*  CO2 27 28 28 28 26   GLUCOSE 232* 216* 167* 228* 193*  BUN 26* 24* 22 21 21   CREATININE 1.09* 1.07* 0.97 0.99 1.11*  CALCIUM 8.4* 8.7* 8.3* 8.4* 8.4*   GFR Estimated Creatinine Clearance: 56 mL/min (A) (by C-G formula based on SCr of 1.11 mg/dL (H)). Liver Function Tests: No results for input(s): AST, ALT, ALKPHOS, BILITOT, PROT, ALBUMIN in the last 168 hours. No results for  input(s): LIPASE, AMYLASE in the last 168 hours. No results for input(s): AMMONIA in the last 168 hours. Coagulation profile No results for input(s): INR, PROTIME in the last 168 hours.  CBC: Recent Labs  Lab 01/01/18 0322 01/02/18 0410 01/02/18 1316 01/02/18 1812 01/03/18 0515  WBC 13.8* 12.0* 13.1* 11.2* 10.1  NEUTROABS  --   --  11.7*  --   --   HGB 9.7* 9.0* 8.8* 9.7* 8.6*  HCT 29.4* 26.6* 25.7* 28.6* 25.6*  MCV 101.0* 100.4* 100.0 100.0 100.4*  PLT 270 253 245 275 262   Cardiac Enzymes: Recent Labs  Lab 01/02/18 1316  TROPONINI <0.03   BNP (last 3 results) No results for input(s): PROBNP in the last 8760 hours. CBG: Recent Labs  Lab 01/02/18 1005 01/02/18 1310 01/02/18 1648 01/02/18 2149 01/03/18 0747  GLUCAP 215* 234* 209* 143* 168*   D-Dimer: No results for input(s): DDIMER in the last 72 hours. Hgb A1c: No results for input(s): HGBA1C in the last 72 hours. Lipid Profile: No results for input(s):  CHOL, HDL, LDLCALC, TRIG, CHOLHDL, LDLDIRECT in the last 72 hours. Thyroid function studies: No results for input(s): TSH, T4TOTAL, T3FREE, THYROIDAB in the last 72 hours.  Invalid input(s): FREET3 Anemia work up: No results for input(s): VITAMINB12, FOLATE, FERRITIN, TIBC, IRON, RETICCTPCT in the last 72 hours. Sepsis Labs: Recent Labs  Lab 01/02/18 0410 01/02/18 1316 01/02/18 1812 01/03/18 0515  WBC 12.0* 13.1* 11.2* 10.1   Microbiology Recent Results (from the past 240 hour(s))  MRSA PCR Screening     Status: None   Collection Time: 01/02/18  1:16 PM  Result Value Ref Range Status   MRSA by PCR NEGATIVE NEGATIVE Final    Comment:        The GeneXpert MRSA Assay (FDA approved for NASAL specimens only), is one component of a comprehensive MRSA colonization surveillance program. It is not intended to diagnose MRSA infection nor to guide or monitor treatment for MRSA infections. Performed at [facility], [address], [place], [place] [address]      Medications:   . amLODipine  5 mg Oral Daily  . aspirin EC  325 mg Oral BID PC  . atorvastatin  40 mg Oral q1800  . benztropine  0.5 mg Oral BID  . celecoxib  200 mg Oral BID  . docusate sodium  100 mg Oral BID  . gabapentin  300 mg Oral BID  . insulin aspart  0-15 Units Subcutaneous TID WC  . insulin aspart  5 Units Subcutaneous BID  . levothyroxine  75 mcg Oral QAC breakfast  . methylPREDNISolone (SOLU-MEDROL) injection  40 mg Intravenous Q12H  . metoprolol tartrate  50 mg Oral BID  . mirabegron ER  50 mg Oral Daily  . QUEtiapine  25 mg Oral QHS  . risperiDONE  1 mg Oral BID   Continuous Infusions: . sodium chloride 10 mL/hr at 01/02/18 0825  . doxycycline (VIBRAMYCIN) IV Stopped (01/02/18 2153)  . methocarbamol (ROBAXIN) IV Stopped (12/31/17 1053)     LOS: 3 days   [name]  [facility] Pager [phone]  *Please refer to [email or web address], password [email or web address] to get updated schedule on who will round on this patient, as hospitalists switch teams weekly. If 7PM-7AM, please contact night-coverage at [email or web address], password [email or web address] for any overnight needs.  01/03/2018, 7:53 AM

## 2018-01-03 NOTE — Progress Notes (Signed)
Physical Therapy Treatment Patient Details Name: Marilyn Sexton MRN: [id number] DOB: Jan 27, 1946 Today's Date: 01/03/2018    History of Present Illness RTKA. H/O DM and MI, Required Narcan 10/25 PM. To SDU for lethargy     PT Comments    Patient participated in RTher exercises. Plans Dc to home when able.   Follow Up Recommendations  Home health PT;SNF     Equipment Recommendations  None recommended by PT    Recommendations for Other Services       Precautions / Restrictions Precautions Precautions: Knee;Fall Required Braces or Orthoses: Knee Immobilizer - Right Knee Immobilizer - Right: Discontinue once straight leg raise with < 10 degree lag    Mobility  Bed Mobility   B        Stairs             Wheelchair Mobility    Modified Rankin (Stroke Patients Only)       Balance                                            Cognition Arousal/Alertness: Awake/alert Behavior During Therapy: WFL for tasks assessed/performed                           Following Commands: Follows one step commands consistently       General Comments: patient alert, near her baseline,  a little confused re: date.      Exercises Total Joint Exercises Ankle Circles/Pumps: AROM;Right;Left;10 reps Quad Sets: AROM;Right;10 reps;Both Hip ABduction/ADduction: AROM;Right;10 reps Straight Leg Raises: AAROM;Right;10 reps;Supine Long Arc Quad: AAROM;Right;10 reps;Seated Knee Flexion: AAROM;Right;10 reps;Seated Goniometric ROM: 10-60 right knee flexion    General Comments        Pertinent Vitals/Pain Faces Pain Scale: Hurts little more Pain Location: right knee Pain Descriptors / Indicators: Grimacing Pain Intervention(s): Monitored during session;Premedicated before session    Home Living                      Prior Function            PT Goals (current goals can now be found in the care plan section) Progress towards PT  goals: Progressing toward goals    Frequency    7X/week      PT Plan Current plan remains appropriate    Co-evaluation              AM-PAC PT "6 Clicks" Daily Activity  Outcome Measure  Difficulty turning over in bed (including adjusting bedclothes, sheets and blankets)?: Unable Difficulty moving from lying on back to sitting on the side of the bed? : Unable Difficulty sitting down on and standing up from a chair with arms (e.g., wheelchair, bedside commode, etc,.)?: Unable Help needed moving to and from a bed to chair (including a wheelchair)?: A Lot Help needed walking in hospital room?: A Lot Help needed climbing 3-5 steps with a railing? : Total 6 Click Score: 5    End of Session Equipment Utilized During Treatment: Right knee immobilizer Activity Tolerance: Patient tolerated treatment well Patient left: in chair;with call bell/phone within reach;with family/visitor present;with chair alarm set Nurse Communication: Mobility status PT Visit Diagnosis: Unsteadiness on feet (R26.81);Pain;Difficulty in walking, not elsewhere classified (R26.2) Pain - Right/Left: Right Pain - part of body: Knee     Time: 4098-1191  PT Time Calculation (min) (ACUTE ONLY): 14 min  Charges:   $Therapeutic Exercise: 8-22 mins                     Blanchard Kelch PT Acute Rehabilitation Services Pager [phone] Office [phone]   Rada Hay 01/03/2018, 1:38 PM

## 2018-01-03 NOTE — Progress Notes (Signed)
Physical Therapy Treatment Patient Details Name: Marilyn Sexton MRN: [id number] DOB: 1945/10/30 Today's Date: 01/03/2018    History of Present Illness RTKA. H/O DM and MI, Required Narcan 10/25 PM. To SDU for lethargy     PT Comments    The patient is alert and participatory. Patient and spouse both report plans are to go home and not interested in SNF. Continue PT   Follow Up Recommendations  Home health PT;SNF(pt and family state plans home)     Equipment Recommendations  None recommended by PT    Recommendations for Other Services       Precautions / Restrictions Precautions Precautions: Knee;Fall Required Braces or Orthoses: Knee Immobilizer - Right Knee Immobilizer - Right: Discontinue once straight leg raise with < 10 degree lag    Mobility  Bed Mobility   Bed Mobility: Supine to Sit     Supine to sit: Mod assist;+2 for physical assistance     General bed mobility comments: assist to move LEs off bed and assist to lift trunk   Transfers Overall transfer level: Needs assistance Equipment used: Rolling walker (2 wheeled) Transfers: Sit to/from Stand Sit to Stand: Min assist         General transfer comment: cues for hand placement,  Ambulation/Gait Ambulation/Gait assistance: Min assist;+2 safety/equipment Gait Distance (Feet): 100 Feet Assistive device: Rolling walker (2 wheeled) Gait Pattern/deviations: Step-to pattern;Step-through pattern;Decreased step length - right     General Gait Details: patient is alert and ambulated well with KI on right   Stairs             Wheelchair Mobility    Modified Rankin (Stroke Patients Only)       Balance                                            Cognition Arousal/Alertness: Awake/alert Behavior During Therapy: WFL for tasks assessed/performed                           Following Commands: Follows one step commands consistently       General Comments:  patient alert, near her baseline,  a little confused re: date.      Exercises      General Comments        Pertinent Vitals/Pain Faces Pain Scale: Hurts even more Pain Location: right knee Pain Descriptors / Indicators: Grimacing Pain Intervention(s): Monitored during session;Premedicated before session    Home Living                      Prior Function            PT Goals (current goals can now be found in the care plan section) Progress towards PT goals: Progressing toward goals    Frequency    7X/week      PT Plan Discharge plan needs to be updated    Co-evaluation              AM-PAC PT "6 Clicks" Daily Activity  Outcome Measure  Difficulty turning over in bed (including adjusting bedclothes, sheets and blankets)?: Unable Difficulty moving from lying on back to sitting on the side of the bed? : Unable Difficulty sitting down on and standing up from a chair with arms (e.g., wheelchair, bedside commode, etc,.)?: Unable Help needed moving to  and from a bed to chair (including a wheelchair)?: A Lot Help needed walking in hospital room?: A Lot Help needed climbing 3-5 steps with a railing? : Total 6 Click Score: 8    End of Session Equipment Utilized During Treatment: Right knee immobilizer Activity Tolerance: Patient tolerated treatment well Patient left: in chair;with call bell/phone within reach;with family/visitor present;with chair alarm set Nurse Communication: Mobility status PT Visit Diagnosis: Unsteadiness on feet (R26.81);Pain;Difficulty in walking, not elsewhere classified (R26.2) Pain - Right/Left: Right Pain - part of body: Knee     Time: 1140-1156 PT Time Calculation (min) (ACUTE ONLY): 16 min  Charges:  $Gait Training: 8-22 mins                     Blanchard Kelch PT Acute Rehabilitation Services Pager [phone] Office [phone]    Rada Hay 01/03/2018, 1:35 PM

## 2018-01-04 ENCOUNTER — Encounter (HOSPITAL_COMMUNITY): Payer: Self-pay | Admitting: Orthopedic Surgery

## 2018-01-04 LAB — BASIC METABOLIC PANEL
ANION GAP: 11 (ref 5–15)
BUN: 29 mg/dL — AB (ref 8–23)
CO2: 26 mmol/L (ref 22–32)
Calcium: 9 mg/dL (ref 8.9–10.3)
Chloride: 92 mmol/L — ABNORMAL LOW (ref 98–111)
Creatinine, Ser: 1.02 mg/dL — ABNORMAL HIGH (ref 0.44–1.00)
GFR calc Af Amer: >60 mL/min (ref >60)
GFR calc non Af Amer: 54 mL/min — ABNORMAL LOW (ref >60)
GLUCOSE: 125 mg/dL — AB (ref 70–99)
POTASSIUM: 4.1 mmol/L (ref 3.5–5.1)
Sodium: 129 mmol/L — ABNORMAL LOW (ref 135–145)

## 2018-01-04 LAB — GLUCOSE, CAPILLARY: Glucose-Capillary: 139 mg/dL — ABNORMAL HIGH (ref 70–99)

## 2018-01-04 MED ORDER — TRAMADOL HCL 50 MG PO TABS: 50.0000 mg | ORAL_TABLET | Freq: Four times a day (QID) | ORAL | 0 refills | Status: DC | PRN

## 2018-01-04 NOTE — Progress Notes (Signed)
Occupational Therapy Treatment Patient Details Name: Marilyn Sexton MRN: [id number] DOB: 01-26-1946 Today's Date: 01/04/2018    History of present illness RTKA. H/O DM and MI, Required Narcan 10/25 PM. To SDU for lethargy    OT comments  Pt plans to DC home this day. Pt doing well  Follow Up Recommendations  Follow surgeon's recommendation for DC plan and follow-up therapies    Equipment Recommendations  None recommended by OT    Recommendations for Other Services      Precautions / Restrictions Precautions Precautions: Knee;Fall Precaution Comments: did not use KI       Mobility Bed Mobility               General bed mobility comments: pt in chair  Transfers Overall transfer level: Needs assistance Equipment used: Rolling walker (2 wheeled) Transfers: Sit to/from [employer] Sit to Stand: Min guard Stand pivot transfers: Min guard       General transfer comment: cues for hand placement,        ADL either performed or assessed with clinical judgement   ADL Overall ADL's : Needs assistance/impaired                     Lower Body Dressing: Minimal assistance;Sit to/from stand;Cueing for sequencing;Cueing for safety   Toilet Transfer: Minimal assistance;Ambulation;Cueing for sequencing;Cueing for safety;RW;Comfort height toilet   Toileting- Clothing Manipulation and Hygiene: Minimal assistance;Sit to/from stand;Cueing for sequencing;Cueing for safety   Tub/ Shower Transfer: Walk-in shower;Minimal assistance;Rolling walker           Vision Patient Visual Report: No change from baseline     Perception     Praxis      Cognition Arousal/Alertness: Awake/alert Behavior During Therapy: WFL for tasks assessed/performed Overall Cognitive Status: Within Functional Limits for tasks assessed                                                     Pertinent Vitals/ Pain       Pain Score: 4  Pain  Location: right knee Pain Descriptors / Indicators: Aching;Discomfort Pain Intervention(s): Repositioned     Prior Functioning/Environment              Frequency  Min 2X/week        Progress Toward Goals  OT Goals(current goals can now be found in the care plan section)  Progress towards OT goals: Progressing toward goals     Plan Discharge plan remains appropriate    Co-evaluation                 AM-PAC PT "6 Clicks" Daily Activity     Outcome Measure   Help from another person eating meals?: None Help from another person taking care of personal grooming?: A Little Help from another person toileting, which includes using toliet, bedpan, or urinal?: A Little Help from another person bathing (including washing, rinsing, drying)?: A Little Help from another person to put on and taking off regular upper body clothing?: A Little Help from another person to put on and taking off regular lower body clothing?: A Little 6 Click Score: 19    End of Session Equipment Utilized During Treatment: Rolling walker  OT Visit Diagnosis: Unsteadiness on feet (R26.81);Pain;Cognitive communication deficit (R41.841) Pain - Right/Left: Right Pain - part of body:  Knee   Activity Tolerance Patient limited by lethargy   Patient Left in chair;with call bell/phone within reach;with chair alarm set;with family/visitor present   Nurse Communication        Time: 1200-1210 OT Time Calculation (min): 10 min  Charges: OT General Charges $OT Visit: 1 Visit OT Treatments $Self Care/Home Management : 8-22 mins  Lise Auer, OT Acute Rehabilitation Services [phone] Office- [phone]      Marilyn Sexton, Marilyn Sexton 01/04/2018, 2:55 PM

## 2018-01-04 NOTE — Progress Notes (Addendum)
[facility] PROGRESS NOTE    Progress Note  Marilyn Sexton  UEA:[id number] DOB: 09/06/1945 DOA: 12/31/2017 PCP: Adrian Prince, MD     Brief Narrative:   Marilyn Sexton is an 72 y.o. female past medical history of acute coronary syndrome, mild COPD bipolar disorder type II hypertension admitted for right knee pain who is status post knee arthroplasty, the patient started complaining today of wheezing shortness of breath, with productive sputum she has a 23-year pack history of smoking  Assessment/Plan:   COPD with acute exacerbation (HCC) She is currently not wheezing on physical exam. Change steroids and antibiotics to orals, she could go home on steroid taper decrease 10 mg daily then stop Oral doxycycline for 5 more days. [facility] will sing off.  Acute encephalopathy likely due to hyponatremia: In the setting of diuresis. Was on IV fluids overnight, if her sodium is greater than 125 she can be disc charge home from internal medicine standpoint. Restart her Lasix on 01/08/2018. She should not go home on hydrochlorothiazide.  Primary osteoarthritis of right knee Management per Ortho. Therapy to start working with the patient.  Bipolar affective disorder, manic, moderate (HCC) Continue current home medications.  AKI (acute kidney injury) (HCC) Fractional excretion of sodium is less than 1, her creatinine has returned to baseline with IV fluids. Likely prerenal azotemia. She can resume her benazepril and Norvasc as an outpatient.  Type 2 diabetes mellitus without complication (HCC) Good control. Resume metformin as an outpatient.   DVT prophylaxis: lovenox Family Communication:none Disposition Plan/Barrier to D/C: Able to be DC'd if her sodium is greater than 125. Code Status:     Code Status Orders  (From admission, onward)         Start     Ordered   12/31/17 1213  Full code  Continuous     12/31/17 1212        Code Status History    Date  Active Date Inactive Code Status Order ID Comments User Context   08/15/2017 2319 08/16/2017 2202 Full Code [id number]  Rinaldo Cloud, MD Inpatient   07/20/2014 1940 07/23/2014 1912 Full Code [id number]  Derwood Kaplan, MD ED   07/20/2014 0010 07/20/2014 1940 Full Code [id number]  Elson Areas, PA-C ED    Advance Directive Documentation     Most Recent Value  Type of Advance Directive  Healthcare Power of Attorney, Living will  Pre-existing out of facility DNR order (yellow form or pink MOST form)  -  "MOST" Form in Place?  -        IV Access:    Peripheral IV   Procedures and diagnostic studies:   Ct Head Wo Contrast  Result Date: 01/02/2018 CLINICAL DATA:  Altered mental status EXAM: CT HEAD WITHOUT CONTRAST TECHNIQUE: Contiguous axial images were obtained from the base of the skull through the vertex without intravenous contrast. COMPARISON:  07/20/2014 FINDINGS: Brain: There is no mass, hemorrhage or extra-axial collection. The size and configuration of the ventricles and extra-axial CSF spaces are normal. There is an old left basal ganglia lacunar infarct. There is hypoattenuation of the periventricular white matter, most commonly indicating chronic ischemic microangiopathy. Vascular: No abnormal hyperdensity of the major intracranial arteries or dural venous sinuses. No intracranial atherosclerosis. Skull: The visualized skull base, calvarium and extracranial soft tissues are normal. Sinuses/Orbits: No fluid levels or advanced mucosal thickening of the visualized paranasal sinuses. No mastoid or middle ear effusion. The orbits are normal. IMPRESSION: Chronic microvascular ischemia without acute intracranial  abnormality. Old left basal ganglia small vessel infarct. Electronically Signed   By: Deatra Robinson M.D.   On: 01/02/2018 16:21     Medical Consultants:    None.  Anti-Infectives:   Doxy  Subjective:    Marilyn Sexton back to baseline wants to go home.  She relates  her breathing is back to baseline.  Objective:    Vitals:   01/03/18 1500 01/03/18 1516 01/03/18 2147 01/04/18 0557  BP:  (!) 162/56 (!) 176/71 (!) 146/62  Pulse: 76  83 63  Resp: 20  17 16   Temp: 99.9 F (37.7 C)  (!) 97.5 F (36.4 C) 98.1 F (36.7 C)  TempSrc:   Oral Oral  SpO2: 97%  98% 98%  Weight:      Height:        Intake/Output Summary (Last 24 hours) at 01/04/2018 0849 Last data filed at 01/04/2018 0600 Gross per 24 hour  Intake 1109.28 ml  Output 2250 ml  Net -1140.72 ml   Filed Weights   12/31/17 0612 12/31/17 1201 01/02/18 1330  Weight: 97.2 kg 97.2 kg 102.5 kg    Exam: General exam: In no acute distress. Respiratory system: Good air movement and clear to auscultation. Cardiovascular system: S1 & S2 heard, RRR.   Gastrointestinal system: Abdomen is nondistended, soft and nontender.  Central nervous system: Alert and oriented. No focal neurological deficits. Extremities: No pedal edema. Skin: No rashes, lesions or ulcers Psychiatry: Judgement and insight appear normal. Mood & affect appropriate.    Data Reviewed:    Labs: Basic Metabolic Panel: Recent Labs  Lab 01/02/18 1644 01/02/18 2047 01/03/18 0116 01/03/18 0515 01/03/18 1554  NA 124* 123* 125* 125* 125*  K 5.2* 4.4 4.6 4.6 4.9  CL 86* 86* 88* 89* 88*  CO2 28 28 28 26 24   GLUCOSE 216* 167* 228* 193* 226*  BUN 24* 22 21 21  24*  CREATININE 1.07* 0.97 0.99 1.11* 1.07*  CALCIUM 8.7* 8.3* 8.4* 8.4* 8.6*   GFR Estimated Creatinine Clearance: 58.1 mL/min (A) (by C-G formula based on SCr of 1.07 mg/dL (H)). Liver Function Tests: No results for input(s): AST, ALT, ALKPHOS, BILITOT, PROT, ALBUMIN in the last 168 hours. No results for input(s): LIPASE, AMYLASE in the last 168 hours. No results for input(s): AMMONIA in the last 168 hours. Coagulation profile No results for input(s): INR, PROTIME in the last 168 hours.  CBC: Recent Labs  Lab 01/01/18 0322 01/02/18 0410 01/02/18 1316  01/02/18 1812 01/03/18 0515  WBC 13.8* 12.0* 13.1* 11.2* 10.1  NEUTROABS  --   --  11.7*  --   --   HGB 9.7* 9.0* 8.8* 9.7* 8.6*  HCT 29.4* 26.6* 25.7* 28.6* 25.6*  MCV 101.0* 100.4* 100.0 100.0 100.4*  PLT 270 253 245 275 262   Cardiac Enzymes: Recent Labs  Lab 01/02/18 1316  TROPONINI <0.03   BNP (last 3 results) No results for input(s): PROBNP in the last 8760 hours. CBG: Recent Labs  Lab 01/03/18 0747 01/03/18 1249 01/03/18 1714 01/03/18 2244 01/04/18 0730  GLUCAP 168* 269* 202* 182* 139*   D-Dimer: No results for input(s): DDIMER in the last 72 hours. Hgb A1c: No results for input(s): HGBA1C in the last 72 hours. Lipid Profile: No results for input(s): CHOL, HDL, LDLCALC, TRIG, CHOLHDL, LDLDIRECT in the last 72 hours. Thyroid function studies: No results for input(s): TSH, T4TOTAL, T3FREE, THYROIDAB in the last 72 hours.  Invalid input(s): FREET3 Anemia work up: No results for input(s): VITAMINB12,  FOLATE, FERRITIN, TIBC, IRON, RETICCTPCT in the last 72 hours. Sepsis Labs: Recent Labs  Lab 01/02/18 0410 01/02/18 1316 01/02/18 1812 01/03/18 0515  WBC 12.0* 13.1* 11.2* 10.1   Microbiology Recent Results (from the past 240 hour(s))  MRSA PCR Screening     Status: None   Collection Time: 01/02/18  1:16 PM  Result Value Ref Range Status   MRSA by PCR NEGATIVE NEGATIVE Final    Comment:        The GeneXpert MRSA Assay (FDA approved for NASAL specimens only), is one component of a comprehensive MRSA colonization surveillance program. It is not intended to diagnose MRSA infection nor to guide or monitor treatment for MRSA infections. Performed at [facility], [address], [place], [place] [address]      Medications:   . amLODipine  5 mg Oral Daily  . aspirin EC  325 mg Oral BID PC  . atorvastatin  40 mg Oral q1800  . benazepril  20 mg Oral BID  . benztropine  0.5 mg Oral BID  . celecoxib  200 mg Oral BID  . docusate  sodium  100 mg Oral BID  . doxycycline  100 mg Oral BID  . gabapentin  300 mg Oral BID  . insulin aspart  0-15 Units Subcutaneous TID WC  . insulin aspart  0-5 Units Subcutaneous QHS  . insulin aspart  6 Units Subcutaneous TID WC  . insulin detemir  10 Units Subcutaneous BID  . levothyroxine  75 mcg Oral QAC breakfast  . metoprolol tartrate  50 mg Oral BID  . mirabegron ER  50 mg Oral Daily  . predniSONE  40 mg Oral Q breakfast  . QUEtiapine  25 mg Oral QHS  . risperiDONE  1 mg Oral BID   Continuous Infusions: . sodium chloride Stopped (01/03/18 1750)  . methocarbamol (ROBAXIN) IV Stopped (12/31/17 1053)     LOS: 4 days   [name]  [facility] Pager [phone]  *Please refer to [email or web address], password [email or web address] to get updated schedule on who will round on this patient, as hospitalists switch teams weekly. If 7PM-7AM, please contact night-coverage at [email or web address], password [email or web address] for any overnight needs.  01/04/2018, 8:49 AM

## 2018-01-04 NOTE — Care Plan (Signed)
Ortho Bundle Case Management Note  Patient Details  Name: Marilyn Sexton MRN: [id number] Date of Birth: 1945/10/01   Met with patient and husband at bedside with PA. She is doing much better and will discharge to home. They have requested a new rolling walker and I have ordered this from [facility]                  DME Arranged:  Walker rolling DME Agency:  [employer]  HH Arranged:  PT [place] Agency:  [facility] ([facility])  Additional Comments: Please contact me with any questions of if this plan should need to change.  Ladell Heads,  [facility]  [phone] 01/04/2018, 8:29 AM

## 2018-01-04 NOTE — Care Management Important Message (Signed)
Important Message  Patient Details  Name: KALASIA CRAFTON MRN: [id number] Date of Birth: [birth year]   Medicare Important Message Given:  Yes    Caren Macadam 01/04/2018, 11:53 AMImportant Message  Patient Details  Name: SAMARY SHATZ MRN: [id number] Date of Birth: Dec 26, 1945   Medicare Important Message Given:  Yes    Caren Macadam 01/04/2018, 11:53 AM

## 2018-01-04 NOTE — Discharge Summary (Signed)
Patient ID: Marilyn Sexton MRN: [id number] DOB/AGE: 1946/02/18 72 y.o.  Admit date: 12/31/2017 Discharge date: 01/04/2018  Admission Diagnoses:  Principal Problem:   Primary osteoarthritis of right knee Active Problems:   Bipolar affective disorder, manic, moderate (HCC)   Hyponatremia   AKI (acute kidney injury) (HCC)   Type 2 diabetes mellitus without complication (HCC)   COPD with acute exacerbation [facility])   Discharge Diagnoses:  Same  Past Medical History:  Diagnosis Date  . Diabetes mellitus without complication (HCC)   . Dyspnea   . Hypertension   . Myocardial infarction (HCC)   . Thyroid disease     Surgeries: Procedure(s): RIGHT TOTAL KNEE ARTHROPLASTY on 12/31/2017   Consultants: Treatment Team:  [name], MD  Discharged Condition: Improved  Hospital Course: Marilyn Sexton is an 72 y.o. female who was admitted 12/31/2017 for operative treatment ofPrimary osteoarthritis of right knee. Patient has severe unremitting pain that affects sleep, daily activities, and work/hobbies. After pre-op clearance the patient was taken to the operating room on 12/31/2017 and underwent  Procedure(s): RIGHT TOTAL KNEE ARTHROPLASTY.    Patient was given perioperative antibiotics:  Anti-infectives (From admission, onward)   Start     Dose/Rate Route Frequency Ordered Stop   01/03/18 0815  doxycycline (VIBRA-TABS) tablet 100 mg     100 mg Oral 2 times daily 01/03/18 0758     01/02/18 0800  doxycycline (VIBRAMYCIN) 100 mg in sodium chloride 0.9 % 250 mL IVPB  Status:  Discontinued     100 mg 125 mL/hr over 120 Minutes Intravenous Every 12 hours 01/02/18 0737 01/03/18 0758   12/31/17 1400  ceFAZolin (ANCEF) IVPB 2g/100 mL premix     2 g 200 mL/hr over 30 Minutes Intravenous Every 6 hours 12/31/17 1212 12/31/17 2043   12/31/17 0650  ceFAZolin (ANCEF) 2-4 GM/100ML-% IVPB    Note to Pharmacy:  Armandina Gemma   : cabinet override      12/31/17 0650 12/31/17 0749    12/31/17 0600  ceFAZolin (ANCEF) IVPB 2g/100 mL premix     2 g 200 mL/hr over 30 Minutes Intravenous On call to O.R. 12/31/17 0540 12/31/17 0754       Patient was given sequential compression devices, early ambulation, and chemoprophylaxis to prevent DVT.The patient postop had an exacerbation of COPD with acute encephalopathy likely due to hyponatremia.She was transferred to the medical intensive care unit for close management.  She was found to be hyponatremic.  This was managed per the medical team.  She did have a great improvement and  Was then transferred back to the orthopaedic floor where she progressed well with physical therapy.   She was discharged home on 01/04/18.  Her vital signs are stable she is afebrile.  She was ambulating with physical therapy.  Her pain was well controlled on minimal medications only tramadol.  Patient benefited maximally from hospital stay and there were no complications.    Recent vital signs:  Patient Vitals for the past 24 hrs:  BP Temp Temp src Pulse Resp SpO2  01/04/18 0942 (!) 145/63 97.8 F (36.6 C) Oral 64 18 98 %  01/04/18 0557 (!) 146/62 98.1 F (36.7 C) Oral 63 16 98 %  01/03/18 2147 (!) 176/71 (!) 97.5 F (36.4 C) Oral 83 17 98 %  01/03/18 1516 (!) 162/56 - - - - -  01/03/18 1500 - 99.9 F (37.7 C) - 76 20 97 %  01/03/18 1400 - 99.5 F (37.5 C) - 75 (!)  25 94 %  01/03/18 1300 - 99.7 F (37.6 C) - 70 17 95 %  01/03/18 1200 - 99.1 F (37.3 C) - (!) 109 18 95 %     Recent laboratory studies:  Recent Labs    01/02/18 1812  01/03/18 0515 01/03/18 1554 01/04/18 0920  WBC 11.2*  --  10.1  --   --   HGB 9.7*  --  8.6*  --   --   HCT 28.6*  --  25.6*  --   --   PLT 275  --  262  --   --   NA  --    < > 125* 125* 129*  K  --    < > 4.6 4.9 4.1  CL  --    < > 89* 88* 92*  CO2  --    < > 26 24 26   BUN  --    < > 21 24* 29*  CREATININE  --    < > 1.11* 1.07* 1.02*  GLUCOSE  --    < > 193* 226* 125*  CALCIUM  --    < > 8.4* 8.6*  9.0   < > = values in this interval not displayed.     Discharge Medications:   Allergies as of 01/04/2018   No Known Allergies     Medication List    STOP taking these medications   HYDROcodone-acetaminophen 5-325 MG tablet Commonly known as:  NORCO/VICODIN     TAKE these medications   amLODipine 5 MG tablet Commonly known as:  NORVASC Take 5 mg by mouth daily.   aspirin EC 325 MG tablet Take 1 tablet (325 mg total) by mouth 2 (two) times daily after a meal. Take x 1 month post op to decrease risk of blood clots. What changed:    medication strength  how much to take  when to take this  additional instructions   atorvastatin 40 MG tablet Commonly known as:  LIPITOR Take 1 tablet (40 mg total) by mouth daily at 6 PM.   benazepril 20 MG tablet Commonly known as:  LOTENSIN Take 20 mg by mouth 2 (two) times daily.   benztropine 0.5 MG tablet Commonly known as:  COGENTIN Take 1 tablet (0.5 mg total) by mouth 2 (two) times daily.   docusate sodium 100 MG capsule Commonly known as:  COLACE Take 1 capsule (100 mg total) by mouth 2 (two) times daily.   furosemide 20 MG tablet Commonly known as:  LASIX Take 20 mg by mouth daily.   hydrochlorothiazide 25 MG tablet Commonly known as:  HYDRODIURIL Take 25 mg by mouth daily.   insulin lispro 100 UNIT/ML injection Commonly known as:  HUMALOG Inject 12-14 Units into the skin See admin instructions. Inject 12 units SQ in the morning and inject 14 units SQ at night   levothyroxine 75 MCG tablet Commonly known as:  SYNTHROID, LEVOTHROID Take 75 mcg by mouth daily before breakfast.   metFORMIN 1000 MG tablet Commonly known as:  GLUCOPHAGE Take 1,000 mg by mouth 2 (two) times daily with a meal.   metoprolol tartrate 50 MG tablet Commonly known as:  LOPRESSOR Take 50 mg by mouth 2 (two) times daily.   MYRBETRIQ 50 MG Tb24 tablet Generic drug:  mirabegron ER Take 50 mg by mouth daily.   nitroGLYCERIN 0.4 MG SL  tablet Commonly known as:  NITROSTAT Place 1 tablet (0.4 mg total) under the tongue every 5 (five) minutes x 3  doses as needed for chest pain.   oxyCODONE-acetaminophen 5-325 MG tablet Commonly known as:  PERCOCET/ROXICET Take 1-2 tablets by mouth every 6 (six) hours as needed for severe pain.   QUEtiapine 50 MG tablet Commonly known as:  SEROQUEL Take 25 mg by mouth at bedtime.   risperiDONE 1 MG tablet Commonly known as:  RISPERDAL Take 1 mg by mouth 2 (two) times daily.   SYSTANE BALANCE 0.6 % Soln Generic drug:  Propylene Glycol Place 2 drops into both eyes daily as needed (for dry eyes).   tiZANidine 2 MG tablet Commonly known as:  ZANAFLEX Take 1 tablet (2 mg total) by mouth every 8 (eight) hours as needed for muscle spasms.   traMADol 50 MG tablet Commonly known as:  ULTRAM Take 1 tablet (50 mg total) by mouth every 6 (six) hours as needed for moderate pain.            Discharge Care Instructions  (From admission, onward)         Start     Ordered   01/04/18 0000  Weight bearing as tolerated    Question Answer Comment  Laterality right   Extremity Lower      01/04/18 0830   01/04/18 0000  Weight bearing as tolerated    Question Answer Comment  Laterality right   Extremity Lower      01/04/18 1028          Diagnostic Studies: Dg Chest 1 View  Result Date: 01/01/2018 CLINICAL DATA:  Cough, wheezing, shortness of breath EXAM: CHEST  1 VIEW COMPARISON:  12/22/2017 FINDINGS: Mild left basilar opacity, likely atelectasis. Right lung is clear. No pleural effusion or pneumothorax. The heart is normal in size. IMPRESSION: No evidence of acute cardiopulmonary disease. Electronically Signed   By: Charline Bills M.D.   On: 01/01/2018 16:22   Dg Chest 2 View  Result Date: 12/23/2017 CLINICAL DATA:  Preoperative exam. EXAM: CHEST - 2 VIEW COMPARISON:  08/15/2017. FINDINGS: Mediastinum and hilar structures normal. Heart size normal. Lungs are clear. No  pleural effusion or pneumothorax. Degenerative change thoracic spine. IMPRESSION: No acute cardiopulmonary disease. Electronically Signed   By: Maisie Fus  Register   On: 12/23/2017 08:21   Ct Head Wo Contrast  Result Date: 01/02/2018 CLINICAL DATA:  Altered mental status EXAM: CT HEAD WITHOUT CONTRAST TECHNIQUE: Contiguous axial images were obtained from the base of the skull through the vertex without intravenous contrast. COMPARISON:  07/20/2014 FINDINGS: Brain: There is no mass, hemorrhage or extra-axial collection. The size and configuration of the ventricles and extra-axial CSF spaces are normal. There is an old left basal ganglia lacunar infarct. There is hypoattenuation of the periventricular white matter, most commonly indicating chronic ischemic microangiopathy. Vascular: No abnormal hyperdensity of the major intracranial arteries or dural venous sinuses. No intracranial atherosclerosis. Skull: The visualized skull base, calvarium and extracranial soft tissues are normal. Sinuses/Orbits: No fluid levels or advanced mucosal thickening of the visualized paranasal sinuses. No mastoid or middle ear effusion. The orbits are normal. IMPRESSION: Chronic microvascular ischemia without acute intracranial abnormality. Old left basal ganglia small vessel infarct. Electronically Signed   By: Deatra Robinson M.D.   On: 01/02/2018 16:21    Disposition: Discharge disposition: 01-Home or Self Care       Discharge Instructions    Call MD / Call 911   Complete by:  As directed    If you experience chest pain or shortness of breath, CALL 911 and be transported to the  hospital emergency room.  If you develope a fever above 101 F, pus (white drainage) or increased drainage or redness at the wound, or calf pain, call your surgeon's office.   Call MD / Call 911   Complete by:  As directed    If you experience chest pain or shortness of breath, CALL 911 and be transported to the hospital emergency room.  If you  develope a fever above 101 F, pus (white drainage) or increased drainage or redness at the wound, or calf pain, call your surgeon's office.   Diet Carb Modified   Complete by:  As directed    Diet Carb Modified   Complete by:  As directed    Do not put a pillow under the knee. Place it under the heel.   Complete by:  As directed    Face-to-face encounter (required for Medicare/Medicaid patients)   Complete by:  As directed    I Matthew Folks certify that this patient is under my care and that I, or a nurse practitioner or physician's assistant working with me, had a face-to-face encounter that meets the physician face-to-face encounter requirements with this patient on 12/31/2017. The encounter with the patient was in whole, or in part for the following medical condition(s) which is the primary reason for home health care (List medical condition): Status post right total knee arthroplasty   The encounter with the patient was in whole, or in part, for the following medical condition, which is the primary reason for home health care:  Status post right total knee arthroplasty   I certify that, based on my findings, the following services are medically necessary home health services:  Physical therapy   Reason for Medically Necessary Home Health Services:  Therapy- Investment banker, operational, Patent examiner   My clinical findings support the need for the above services:   Unable to leave home safely without assistance and/or assistive device Pain interferes with ambulation/mobility     Further, I certify that my clinical findings support that this patient is homebound due to:  Pain interferes with ambulation/mobility   Home Health   Complete by:  As directed    To provide the following care/treatments:  PT   Increase activity slowly as tolerated   Complete by:  As directed    Increase activity slowly as tolerated   Complete by:  As directed    Weight bearing as tolerated   Complete  by:  As directed    Laterality:  right   Extremity:  Lower   Weight bearing as tolerated   Complete by:  As directed    Laterality:  right   Extremity:  Lower      Follow-up Information    Jodi Geralds, MD. Go on 01/17/2018.   Specialty:  Orthopedic Surgery Why:  Your appointment has been set for 10:00 a  Contact information: [address] [place] [address] [phone]        [facility] Follow up.   Specialty:  Home Health Services Why:  [facility] will provide home health physical therapy. Contact information: [address] [place] [address] [place] [address] [phone]        [facility]. Go on 01/17/2018.   Why:  Your appointment is set for 11:00. Please go straight across the hall after your appointment with Dr. Luiz Blare and check in to do your paperwork.  Contact information: [address]  [place], [place] [address]   [phone]  at Dr Luiz Blare' office            Signed: Matthew Folks 01/04/2018, 11:42 AM

## 2018-01-04 NOTE — Progress Notes (Signed)
All discharge teachings are done, both patient and family verbalized understanding.

## 2018-01-04 NOTE — Progress Notes (Signed)
Subjective: 4 Days Post-Op Procedure(s) (LRB): RIGHT TOTAL KNEE ARTHROPLASTY (Right) Patient reports pain as mild. Taking by mouth and voiding okay.  Has no significant complaints of knee pain.  She is progressing well.  Reports that she wants to go home.  No dizziness.   Objective: Vital signs in last 24 hours: Temp:  [97.5 F (36.4 C)-99.9 F (37.7 C)] 97.8 F (36.6 C) (10/29 0942) Pulse Rate:  [63-109] 64 (10/29 0942) Resp:  [16-25] 18 (10/29 0942) BP: (145-176)/(56-90) 145/63 (10/29 0942) SpO2:  [94 %-98 %] 98 % (10/29 0942)  Intake/Output from previous day: 10/28 0701 - 10/29 0700 In: 1109.3 [P.O.:800; I.V.:309.3] Out: 2250 [Urine:2250] Intake/Output this shift: Total I/O In: 240 [P.O.:240] Out: 1025 [Urine:1025]  Recent Labs    01/02/18 0410 01/02/18 1316 01/02/18 1812 01/03/18 0515  HGB 9.0* 8.8* 9.7* 8.6*   Recent Labs    01/02/18 1812 01/03/18 0515  WBC 11.2* 10.1  RBC 2.86* 2.55*  HCT 28.6* 25.6*  PLT 275 262   Recent Labs    01/03/18 1554 01/04/18 0920  NA 125* 129*  K 4.9 4.1  CL 88* 92*  CO2 24 26  BUN 24* 29*  CREATININE 1.07* 1.02*  GLUCOSE 226* 125*  CALCIUM 8.6* 9.0   No results for input(s): LABPT, INR in the last 72 hours. Right knee exam: Neurovascular intact Sensation intact distally Intact pulses distally Dorsiflexion/Plantar flexion intact Incision: dressing C/D/I Compartment soft  Anticipated LOS equal to or greater than 2 midnights due to - Age 27 and older with one or more of the following:  - Obesity  - Expected need for hospital services (PT, OT, Nursing) required for safe  discharge  - Anticipated need for postoperative skilled nursing care or inpatient rehab  - Active co-morbidities: COPD with acute exacerbation.  Hyponatremia with acute encephalopathy.  Bipolar disorder.  Type 2 diabetes. OR   - Unanticipated findings during/Post Surgery: Slow post-op progression: GI, pain control, mobility  - Patient is a high  risk of re-admission due to: None   Assessment/Plan: 4 Days Post-Op Procedure(s) (LRB): RIGHT TOTAL KNEE ARTHROPLASTY (Right)  Hyponatremia with acute encephalopathy, resolving.  Bipolar affective disorder. Type 2 diabetes,COPD with acute exacerbation.  Stable. Plan: We appreciate the hospitalist's help. Aspirin 325 mg twice daily for DVT prophylaxis 1 month postop. Up with therapy Discharge home with home health  Weight-bear as tolerated on right lower extremity. Follow-up with Dr. Luiz Blare in 10-14 days.    Marilyn Sexton 01/04/2018, 10:23 AM

## 2018-01-04 NOTE — Progress Notes (Signed)
Physical Therapy Treatment Patient Details Name: Marilyn Sexton MRN: [id number] DOB: 1946/02/29 Today's Date: 01/04/2018    History of Present Illness RTKA. H/O DM and MI, Required Narcan 10/25 PM. To SDU for lethargy     PT Comments    Patient is progressiong well. Ready for DC. Less uadible wheezes this visit.    Follow Up Recommendations  Home health PT     Equipment Recommendations  None recommended by PT    Recommendations for Other Services       Precautions / Restrictions Precautions Precautions: Knee;Fall Precaution Comments: did not use KI    Mobility  Bed Mobility               General bed mobility comments: pt in chair  Transfers Overall transfer level: Needs assistance Equipment used: Rolling walker (2 wheeled) Transfers: Sit to/from [employer] Sit to Stand: Min guard Stand pivot transfers: Min guard       General transfer comment: cues for hand placement, tends to not reach baclk to armrests  Ambulation/Gait Ambulation/Gait assistance: Min assist Gait Distance (Feet): 120 Feet Assistive device: Rolling walker (2 wheeled) Gait Pattern/deviations: Step-through pattern     General Gait Details: right leg tending to ext. rotate, appears less than last visit   Stairs Stairs: Yes Stairs assistance: Min assist Stair Management: No rails;Forwards;With walker Number of Stairs: 1 General stair comments: practiced x 2, spouse present   Wheelchair Mobility    Modified Rankin (Stroke Patients Only)       Balance                                            Cognition Arousal/Alertness: Awake/alert Behavior During Therapy: WFL for tasks assessed/performed Overall Cognitive Status: Within Functional Limits for tasks assessed Area of Impairment: Attention;Safety/judgement;Problem solving                   Current Attention Level: Focused;Sustained     Safety/Judgement: Decreased awareness  of safety            Exercises Total Joint Exercises Ankle Circles/Pumps: AROM;Right;Left;10 reps Quad Sets: AROM;Right;10 reps;Both Heel Slides: AAROM;Right;10 reps;Supine Hip ABduction/ADduction: AROM;Right;10 reps Straight Leg Raises: AAROM;Right;10 reps;Supine Long Arc Quad: Right;10 reps;Seated;AROM Knee Flexion: Right;10 reps;Seated;AROM Goniometric ROM: 10-60 right knee    General Comments        Pertinent Vitals/Pain Pain Score: 0-No pain Pain Location: right knee Pain Descriptors / Indicators: Aching;Discomfort Pain Intervention(s): Repositioned    Home Living                      Prior Function            PT Goals (current goals can now be found in the care plan section) Progress towards PT goals: Progressing toward goals    Frequency    7X/week      PT Plan Current plan remains appropriate    Co-evaluation              AM-PAC PT "6 Clicks" Daily Activity  Outcome Measure  Difficulty turning over in bed (including adjusting bedclothes, sheets and blankets)?: A Lot Difficulty moving from lying on back to sitting on the side of the bed? : A Lot Difficulty sitting down on and standing up from a chair with arms (e.g., wheelchair, bedside commode, etc,.)?: A Lot Help needed moving  to and from a bed to chair (including a wheelchair)?: A Lot Help needed walking in hospital room?: A Lot Help needed climbing 3-5 steps with a railing? : A Lot 6 Click Score: 12    End of Session   Activity Tolerance: Patient tolerated treatment well Patient left: in chair;with call bell/phone within reach;with family/visitor present Nurse Communication: Mobility status PT Visit Diagnosis: Unsteadiness on feet (R26.81);Difficulty in walking, not elsewhere classified (R26.2)     Time: 1610-9604 PT Time Calculation (min) (ACUTE ONLY): 16 min  Charges:  $Gait Training: 8-22 mins                     Blanchard Kelch PT Acute Rehabilitation Services Pager  [phone] Office [phone]    Rada Hay 01/04/2018, 3:09 PM

## 2018-12-17 IMAGING — MR MR KNEE*R* W/O CM
4 of 6 series · 18 of 40 positions shown · non-contrast
Comparison: Radiographs from 10/17/2016

CLINICAL DATA: Right knee instability 1 month ago with several
episodes since then. Knee swelling and aspirations. Knee pain.

EXAM:
MRI OF THE RIGHT KNEE WITHOUT CONTRAST
TECHNIQUE: Multiplanar, multisequence MR imaging of the knee was performed. No
intravenous contrast was administered.

[Series 2: PD fat-sat · axial · 4.0mm · 0.27mm/px · z∈[-58,+92]mm · 6 of 31 slices shown (1 of 4)]
[im 1/31]
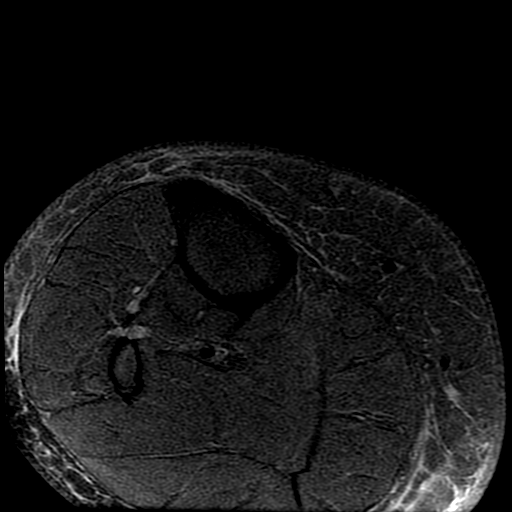
[im 7/31]
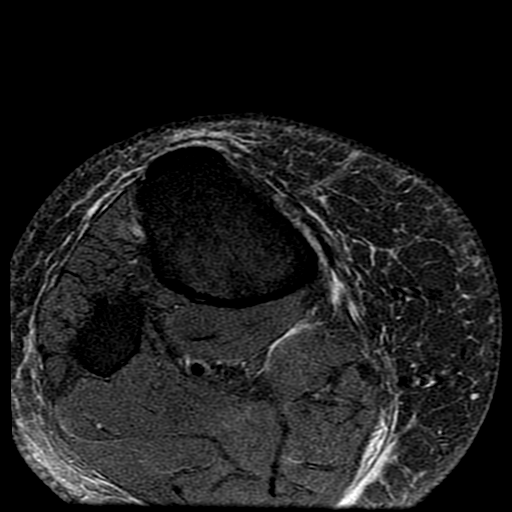
[im 13/31]
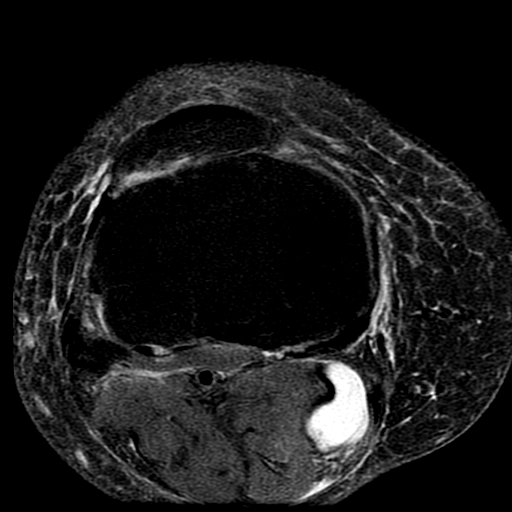
[im 19/31]
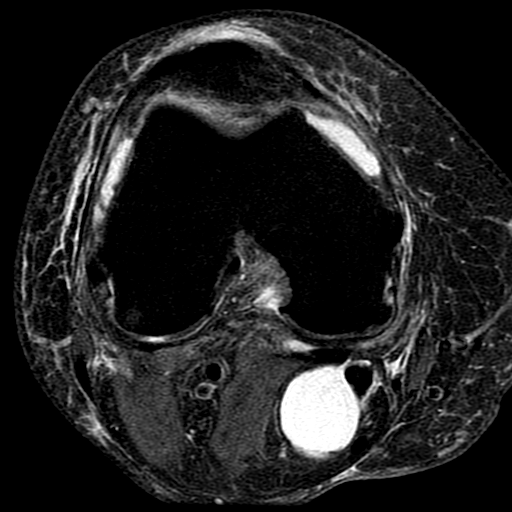
[im 25/31]
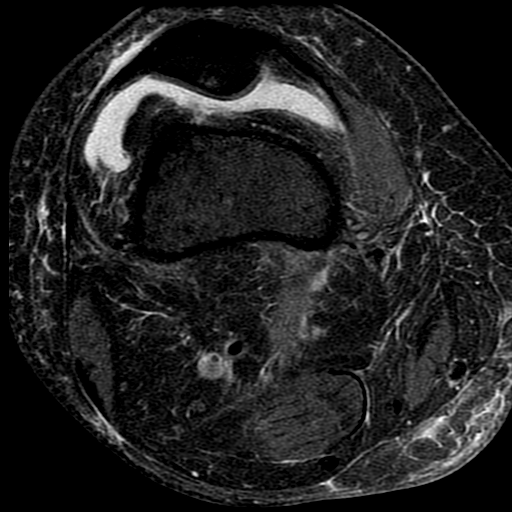
[im 31/31]
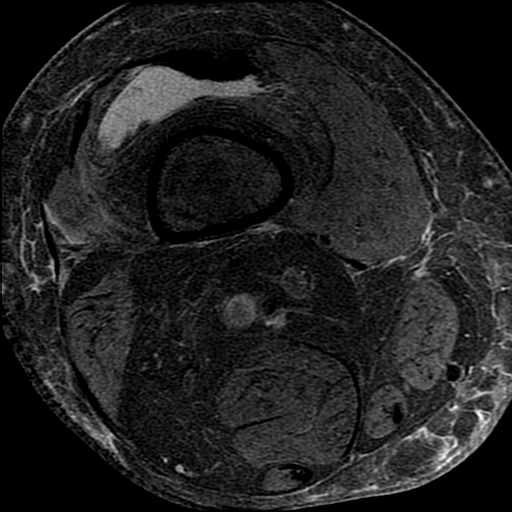

[Series 4: PD fat-sat · coronal · 4.0mm · 0.31mm/px · 6 of 35 slices shown (2 of 4)]
[im 1/35]
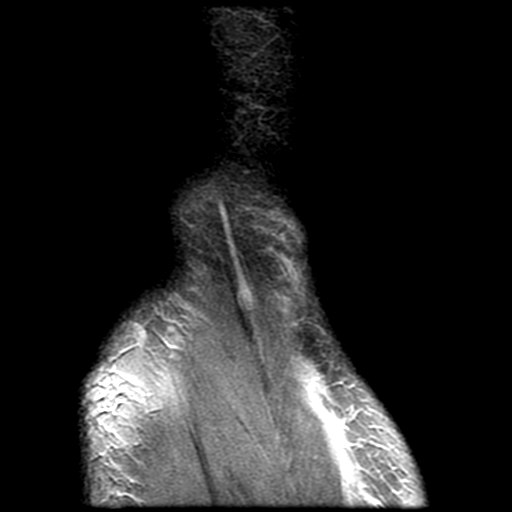
[im 5/35]
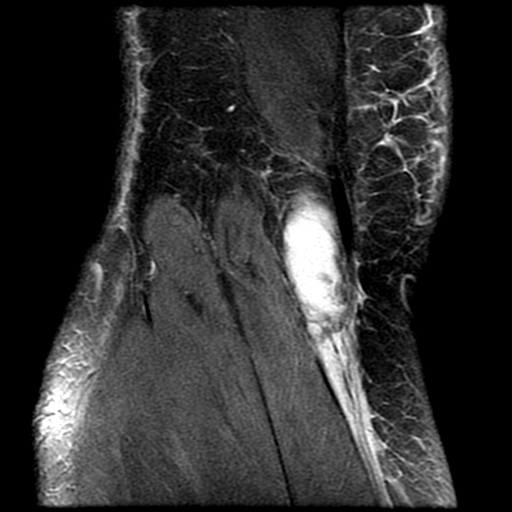
[im 10/35]
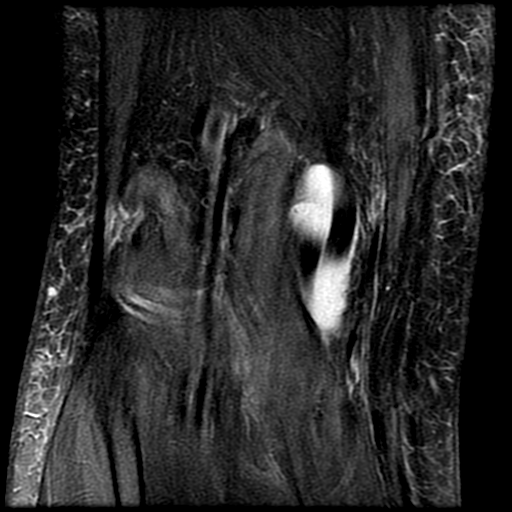
[im 15/35]
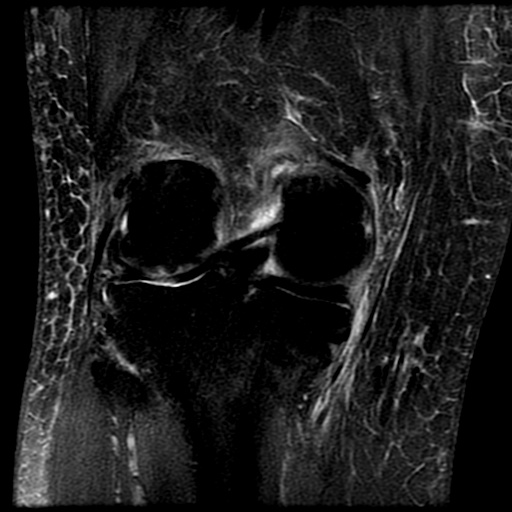
[im 20/35]
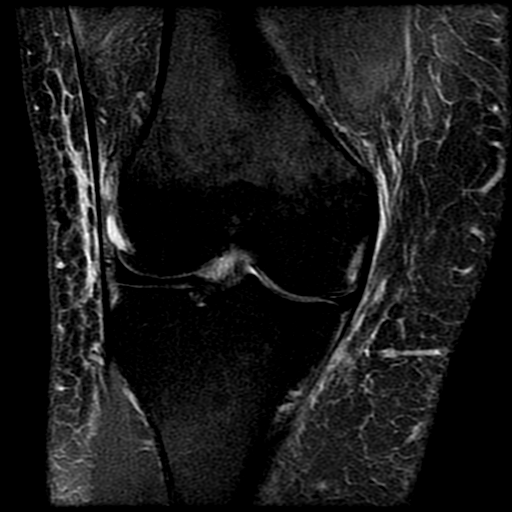
[im 30/35]
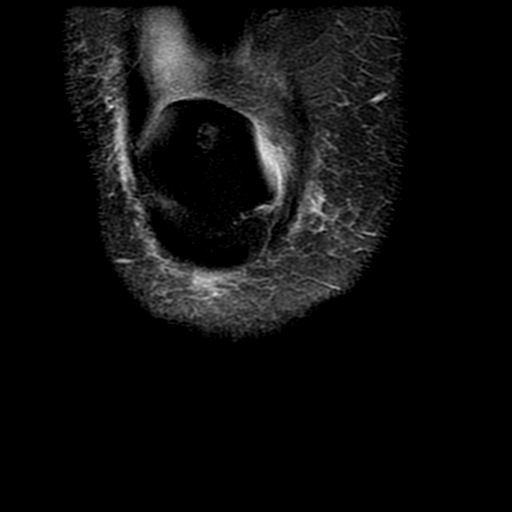

[Series 6: PD fat-sat · sagittal · 4.0mm · 0.35mm/px · 3 of 29 slices shown (3 of 4)]
[im 6/29]
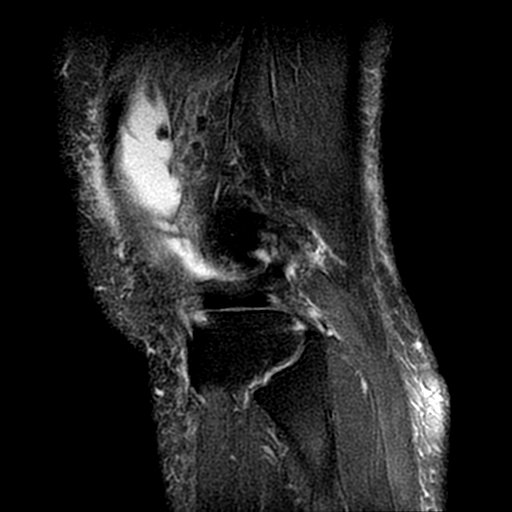
[im 17/29]
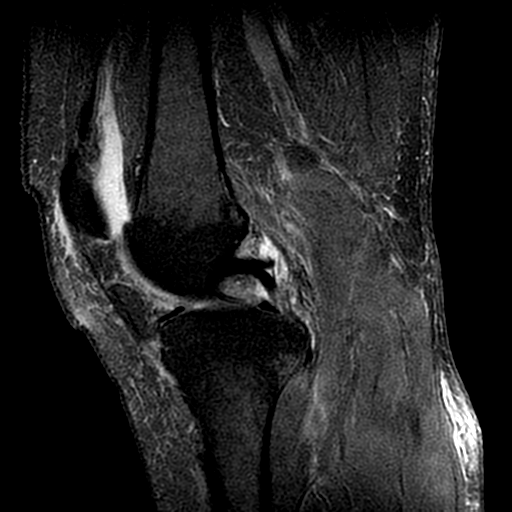
[im 29/29]
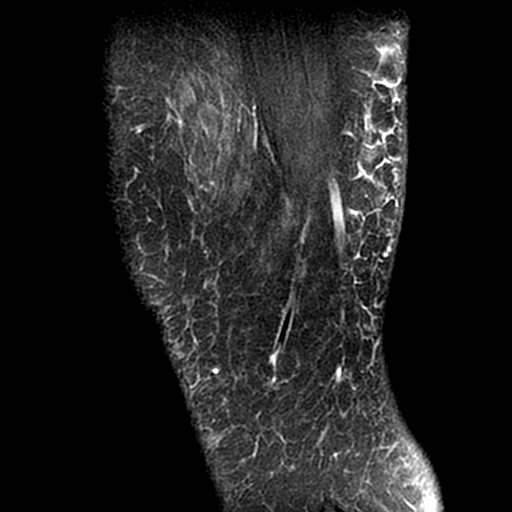

[Series 7: PD fat-sat · coronal · 2.0mm · 0.31mm/px · 3 of 21 slices shown (4 of 4)]
[im 1/21]
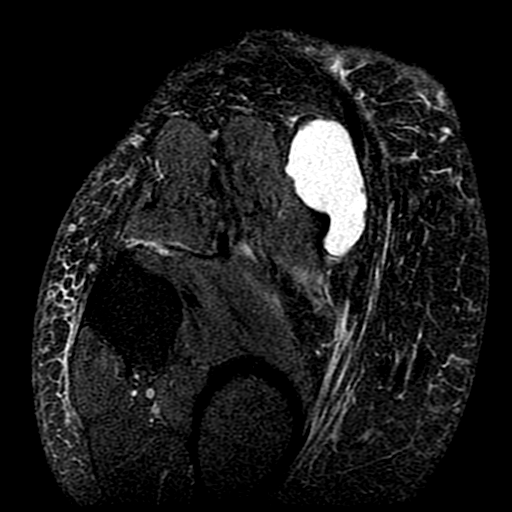
[im 11/21]
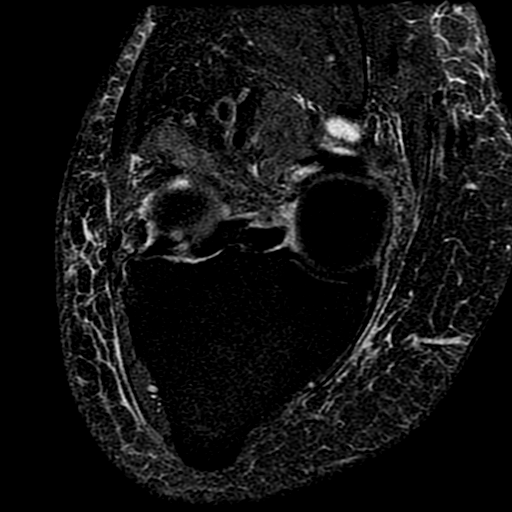
[im 21/21]
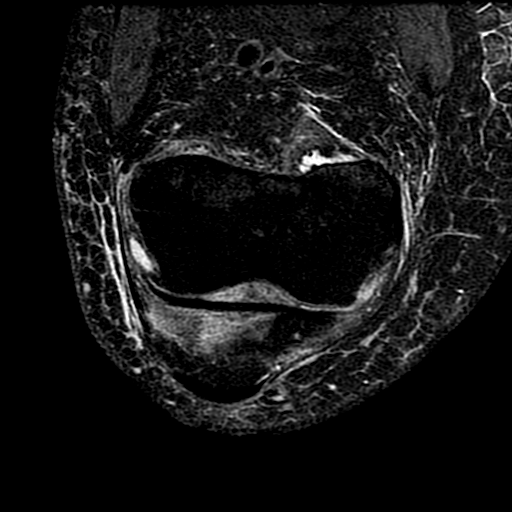

[18 of 40 positions shown; findings below may reference images not displayed]

FINDINGS: New Despite efforts by the technologist and patient, motion artifact
is present on today's exam and could not be eliminated. This reduces
exam sensitivity and specificity.

MENISCI

Medial meniscus:  Unremarkable

Lateral meniscus:  Radial tear of the posterior horn on image [DATE].

LIGAMENTS

Cruciates:  Unremarkable

Collaterals:  Unremarkable

CARTILAGE

Patellofemoral: Chondral fissuring in the lateral patellar facet
near the posterior patellar apex on image [DATE] with underlying focal
subcortical marrow edema. Mild chondral thinning medially in the
femoral trochlear groove. Mild marginal spurring.

Medial: Moderate diffuse degenerative chondral thinning. Marginal
spurring.

Lateral: Moderate degenerative chondral thinning. Chondral fissuring
and irregularity centrally along the posterolateral femoral condyle
as on images 14-16 series 5. Marginal spurring.

Joint: Moderate joint effusion with mild synovitis. Posterior to the
distal PCL there is a 6 mm free osteochondral fragment loose in the
joint as on image [DATE].

Popliteal Fossa: Moderate size Baker's cyst is partially ruptured.
Trace pes anserine bursitis.

Extensor Mechanism: Prepatellar subcutaneous edema with mild
subcutaneous edema adjacent to the lateral patellar facet.

Bones: No significant extra-articular osseous abnormalities
identified.

Other: No supplemental non-categorized findings.
IMPRESSION: 1. Radial tear posterior horn lateral meniscus .
2. Moderate tricompartmental osteoarthritis with chondral fissuring
along the lateral patellar facet and focal more striking chondral
thinning and irregularity centrally along the lateral femoral
condyle.
3. Moderate joint effusion with mild synovitis, and moderate-sized
Baker's cyst, and a 6 mm free osteochondral fragment loose in the
joint posteriorly.
4. Trace pes anserine bursitis.
5. Mild prepatellar subcutaneous edema.

## 2020-01-14 IMAGING — CR DG CHEST 2V
2 series · 2 of 2 positions shown · non-contrast
Comparison: 08/15/2017.

CLINICAL DATA: Preoperative exam.

EXAM:
CHEST - 2 VIEW

[w chest pa]
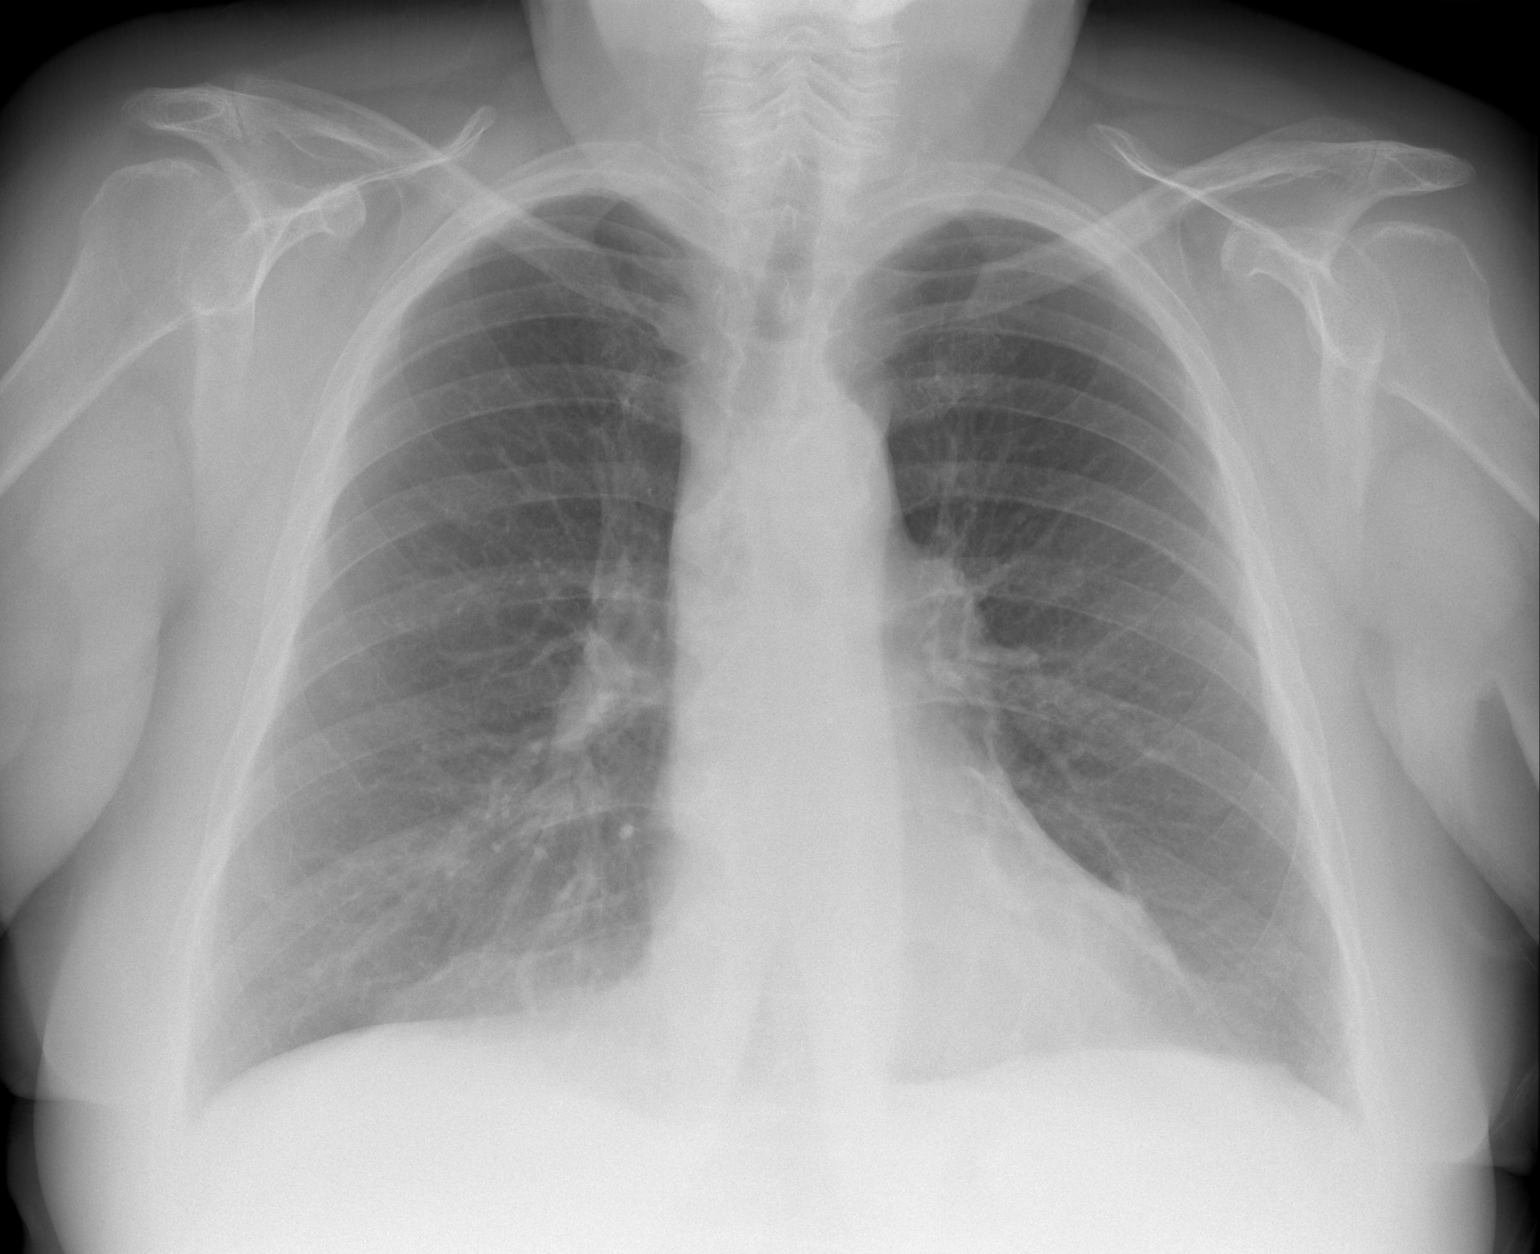

[w chest lat]
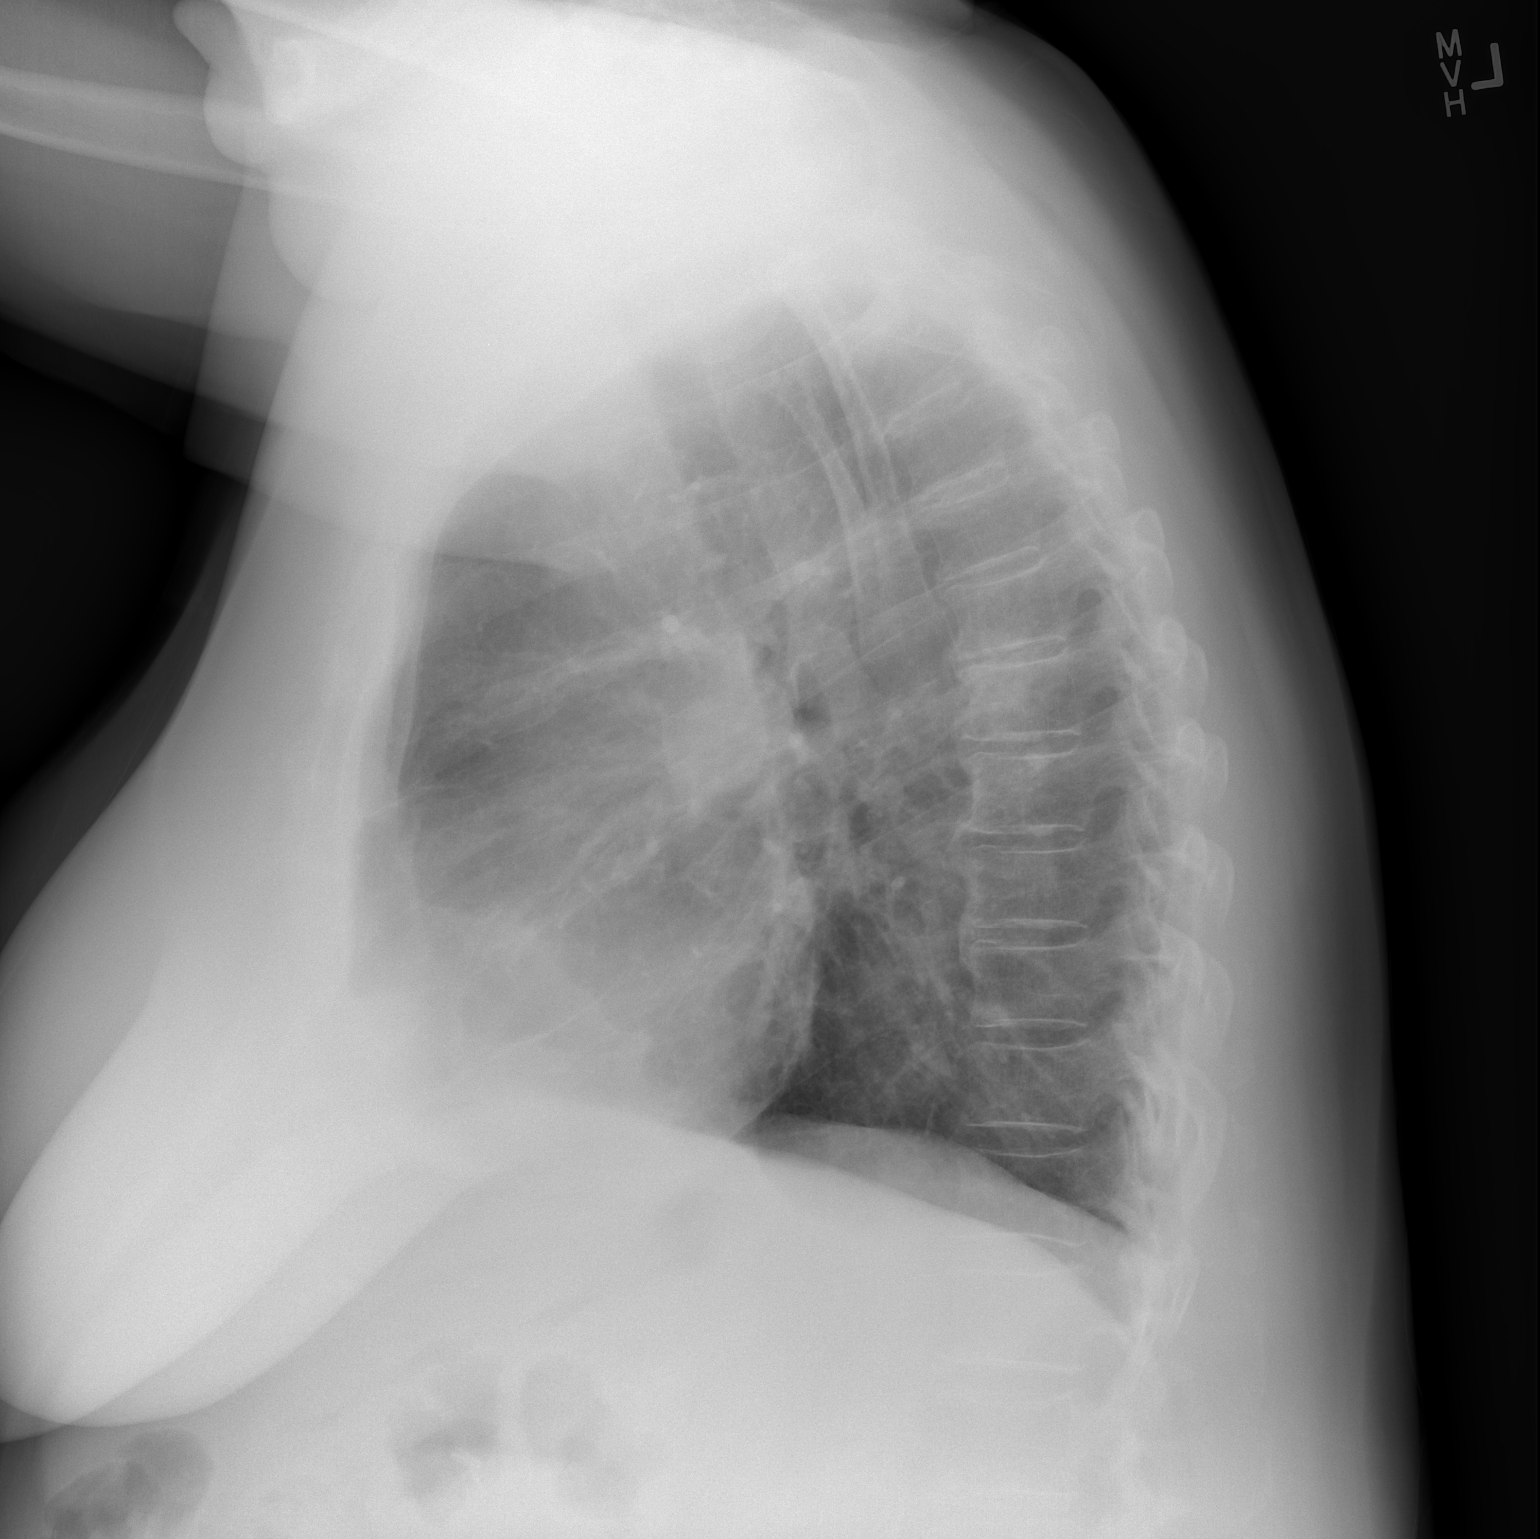

[2 of 2 positions shown; findings below may reference images not displayed]

FINDINGS: Mediastinum and hilar structures normal. Heart size normal. Lungs
are clear. No pleural effusion or pneumothorax. Degenerative change
thoracic spine.
IMPRESSION: No acute cardiopulmonary disease.

## 2020-10-30 ENCOUNTER — Emergency Department (HOSPITAL_BASED_OUTPATIENT_CLINIC_OR_DEPARTMENT_OTHER): Payer: Medicare Other

## 2020-10-30 ENCOUNTER — Encounter (HOSPITAL_COMMUNITY): Payer: Self-pay | Admitting: Emergency Medicine

## 2020-10-30 ENCOUNTER — Emergency Department (HOSPITAL_COMMUNITY)
Admission: EM | Admit: 2020-10-30 | Discharge: 2020-10-30 | Discharge status: Against Medical Advice | Payer: Medicare Other | Source: Home / Self Care | Attending: Emergency Medicine | Admitting: Emergency Medicine

## 2020-10-30 DIAGNOSIS — Z96651 Presence of right artificial knee joint: Secondary | ICD-10-CM | POA: Insufficient documentation

## 2020-10-30 DIAGNOSIS — N179 Acute kidney failure, unspecified: Secondary | ICD-10-CM | POA: Insufficient documentation

## 2020-10-30 DIAGNOSIS — J441 Chronic obstructive pulmonary disease with (acute) exacerbation: Secondary | ICD-10-CM | POA: Insufficient documentation

## 2020-10-30 DIAGNOSIS — Z79899 Other long term (current) drug therapy: Secondary | ICD-10-CM | POA: Insufficient documentation

## 2020-10-30 DIAGNOSIS — I10 Essential (primary) hypertension: Secondary | ICD-10-CM | POA: Insufficient documentation

## 2020-10-30 DIAGNOSIS — Z794 Long term (current) use of insulin: Secondary | ICD-10-CM | POA: Insufficient documentation

## 2020-10-30 DIAGNOSIS — Z7984 Long term (current) use of oral hypoglycemic drugs: Secondary | ICD-10-CM | POA: Insufficient documentation

## 2020-10-30 DIAGNOSIS — L03112 Cellulitis of left axilla: Secondary | ICD-10-CM | POA: Insufficient documentation

## 2020-10-30 DIAGNOSIS — L03116 Cellulitis of left lower limb: Secondary | ICD-10-CM

## 2020-10-30 DIAGNOSIS — Z87891 Personal history of nicotine dependence: Secondary | ICD-10-CM | POA: Insufficient documentation

## 2020-10-30 DIAGNOSIS — E119 Type 2 diabetes mellitus without complications: Secondary | ICD-10-CM | POA: Insufficient documentation

## 2020-10-30 DIAGNOSIS — Z7982 Long term (current) use of aspirin: Secondary | ICD-10-CM | POA: Insufficient documentation

## 2020-10-30 LAB — CBC WITH DIFFERENTIAL/PLATELET
Abs Immature Granulocytes: 0.12 10*3/uL — ABNORMAL HIGH (ref 0.00–0.07)
Basophils Absolute: 0 10*3/uL (ref 0.0–0.1)
Basophils Relative: 0 %
Eosinophils Absolute: 0 10*3/uL (ref 0.0–0.5)
Eosinophils Relative: 0 %
HCT: 32.5 % — ABNORMAL LOW (ref 36.0–46.0)
Hemoglobin: 10.5 g/dL — ABNORMAL LOW (ref 12.0–15.0)
Immature Granulocytes: 1 %
Lymphocytes Relative: 5 %
Lymphs Abs: 1 10*3/uL (ref 0.7–4.0)
MCH: 34 pg (ref 26.0–34.0)
MCHC: 32.3 g/dL (ref 30.0–36.0)
MCV: 105.2 fL — ABNORMAL HIGH (ref 80.0–100.0)
Monocytes Absolute: 1.2 10*3/uL — ABNORMAL HIGH (ref 0.1–1.0)
Monocytes Relative: 6 %
Neutro Abs: 17.8 10*3/uL — ABNORMAL HIGH (ref 1.7–7.7)
Neutrophils Relative %: 88 %
Platelets: 248 10*3/uL (ref 150–400)
RBC: 3.09 MIL/uL — ABNORMAL LOW (ref 3.87–5.11)
RDW: 16.1 % — ABNORMAL HIGH (ref 11.5–15.5)
WBC: 20.1 10*3/uL — ABNORMAL HIGH (ref 4.0–10.5)
nRBC: 0 % (ref 0.0–0.2)

## 2020-10-30 LAB — COMPREHENSIVE METABOLIC PANEL
ALT: 16 U/L (ref 0–44)
AST: 35 U/L (ref 15–41)
Albumin: 3.3 g/dL — ABNORMAL LOW (ref 3.5–5.0)
Alkaline Phosphatase: 80 U/L (ref 38–126)
Anion gap: 8 (ref 5–15)
BUN: 43 mg/dL — ABNORMAL HIGH (ref 8–23)
CO2: 24 mmol/L (ref 22–32)
Calcium: 8.3 mg/dL — ABNORMAL LOW (ref 8.9–10.3)
Chloride: 94 mmol/L — ABNORMAL LOW (ref 98–111)
Creatinine, Ser: 2.32 mg/dL — ABNORMAL HIGH (ref 0.44–1.00)
GFR, Estimated: 22 mL/min — ABNORMAL LOW (ref >60)
Glucose, Bld: 187 mg/dL — ABNORMAL HIGH (ref 70–99)
Potassium: 4.3 mmol/L (ref 3.5–5.1)
Sodium: 126 mmol/L — ABNORMAL LOW (ref 135–145)
Total Bilirubin: 0.9 mg/dL (ref 0.3–1.2)
Total Protein: 6.3 g/dL — ABNORMAL LOW (ref 6.5–8.1)

## 2020-10-30 MED ORDER — SODIUM CHLORIDE 0.9 % IV BOLUS: 1000.0000 mL | Freq: Once | INTRAVENOUS | Status: DC

## 2020-10-30 MED ORDER — SODIUM CHLORIDE 0.9 % IV SOLN
2.0000 g | INTRAVENOUS | Status: DC
  Filled 2020-10-30: qty 20

## 2020-10-30 NOTE — Discharge Instructions (Addendum)
You are leaving AGAINST MEDICAL ADVICE, as we discussed there is a high chance of death with you not receiving proper care and IV antibiotics.  You have a blood infection in addition to kidney injury.  please come back to the emergency department if you change your mind to be admitted.

## 2020-10-30 NOTE — Progress Notes (Signed)
Lower extremity venous LT study completed.  Preliminary results relayed to [place], [place] via secure chat.  See CV Proc for preliminary results report.   Jean Rosenthal, RDMS, RVT

## 2020-10-30 NOTE — ED Triage Notes (Signed)
Pt here from home with c/o left leg and low back pain and left arm , no fall or trauma noted , pt left leg is swollen and red ,

## 2020-10-30 NOTE — ED Provider Notes (Signed)
[facility] EMERGENCY DEPARTMENT Provider Note   CSN: [id number] Arrival date & time: 10/30/20  1247     History No chief complaint on file.   Marilyn Sexton is a 75 y.o. female with past medical history of diabetes, hypertension that presents to the emerge department today for left leg pain.  Patient states that on Monday she started having severe left leg pain, primarily in her calf and then went up into her thighs as well.  Patient states has been progressively getting worse since then.  Denies any fevers, chills, nausea, vomiting.  Patient denies any trauma to the leg.  Denies any abrasions, states that her diabetes is well controlled.  Denies any chest pain or shortness of breath, denies any recent antibiotics.  Patient states that he is generally healthy, was in her normal health prior to this.  States that she has been eating and drinking normally.  No other complaints  HPI     Past Medical History:  Diagnosis Date   Diabetes mellitus without complication (HCC)    Dyspnea    Hypertension    Myocardial infarction [facility])    Thyroid disease     Patient Active Problem List   Diagnosis Date Noted   Hyponatremia 01/02/2018   AKI (acute kidney injury) (HCC) 01/02/2018   Type 2 diabetes mellitus without complication (HCC) 01/02/2018   COPD with acute exacerbation (HCC) 01/02/2018   Primary osteoarthritis of right knee 12/31/2017   Acute coronary syndrome (HCC) 08/15/2017   Old complex tear of lateral meniscus of right knee    Loose body in knee, right knee    Bipolar affective disorder, manic, moderate (HCC) 07/23/2014   History of medication noncompliance    Manic behavior (HCC)     Past Surgical History:  Procedure Laterality Date   CARDIAC CATHETERIZATION     CHOLECYSTECTOMY     coronary stents     EYE SURGERY     bilateral cataracts   KNEE ARTHROSCOPY Right 12/16/2016   Procedure: RIGHT KNEE ARTHROSCOPY AND DEBRIDEMENT;  Surgeon: Nadara Mustard, MD;  Location: [facility] OR;  Service: Orthopedics;  Laterality: Right;   TOTAL KNEE ARTHROPLASTY Right 12/31/2017   Procedure: RIGHT TOTAL KNEE ARTHROPLASTY;  Surgeon: Jodi Geralds, MD;  Location: [facility] ORS;  Service: Orthopedics;  Laterality: Right;  Adductor Block   TUBAL LIGATION       OB History   No obstetric history on file.     No family history on file.  Social History   Tobacco Use   Smoking status: Former    Packs/day: 1.00    Years: 44.00    Pack years: 44.00    Types: Cigarettes    Quit date: 2006    Years since quitting: 16.6   Smokeless tobacco: Never  Vaping Use   Vaping Use: Never used  Substance Use Topics   Alcohol use: No   Drug use: No    Home Medications Prior to Admission medications   Medication Sig Start Date End Date Taking? Authorizing Provider  amLODipine (NORVASC) 5 MG tablet Take 5 mg by mouth daily.    [provider]  aspirin EC 325 MG tablet Take 1 tablet (325 mg total) by mouth 2 (two) times daily after a meal. Take x 1 month post op to decrease risk of blood clots. 12/31/17   Marshia Ly, PA-C  atorvastatin (LIPITOR) 40 MG tablet Take 1 tablet (40 mg total) by mouth daily at 6 PM. 08/17/17  Rinaldo Cloud, MD  benazepril (LOTENSIN) 20 MG tablet Take 20 mg by mouth 2 (two) times daily.    [provider]  benztropine (COGENTIN) 0.5 MG tablet Take 1 tablet (0.5 mg total) by mouth 2 (two) times daily. 07/23/14   Charm Rings, NP  docusate sodium (COLACE) 100 MG capsule Take 1 capsule (100 mg total) by mouth 2 (two) times daily. 12/31/17   Marshia Ly, PA-C  furosemide (LASIX) 20 MG tablet Take 20 mg by mouth daily.    [provider]  hydrochlorothiazide (HYDRODIURIL) 25 MG tablet Take 25 mg by mouth daily.    [provider]  insulin lispro (HUMALOG) 100 UNIT/ML injection Inject 12-14 Units into the skin See admin instructions. Inject 12 units SQ in the morning and inject 14 units SQ at night     [provider]  levothyroxine (SYNTHROID, LEVOTHROID) 75 MCG tablet Take 75 mcg by mouth daily before breakfast.     [provider]  metFORMIN (GLUCOPHAGE) 1000 MG tablet Take 1,000 mg by mouth 2 (two) times daily with a meal.     [provider]  metoprolol tartrate (LOPRESSOR) 50 MG tablet Take 50 mg by mouth 2 (two) times daily.    [provider]  mirabegron ER (MYRBETRIQ) 50 MG TB24 tablet Take 50 mg by mouth daily.     [provider]  nitroGLYCERIN (NITROSTAT) 0.4 MG SL tablet Place 1 tablet (0.4 mg total) under the tongue every 5 (five) minutes x 3 doses as needed for chest pain. 08/16/17   Rinaldo Cloud, MD  oxyCODONE-acetaminophen (PERCOCET/ROXICET) 5-325 MG tablet Take 1-2 tablets by mouth every 6 (six) hours as needed for severe pain. 12/31/17   Marshia Ly, PA-C  Propylene Glycol (SYSTANE BALANCE) 0.6 % SOLN Place 2 drops into both eyes daily as needed (for dry eyes).     [provider]  QUEtiapine (SEROQUEL) 50 MG tablet Take 25 mg by mouth at bedtime.    [provider]  risperiDONE (RISPERDAL) 1 MG tablet Take 1 mg by mouth 2 (two) times daily.    [provider]  tiZANidine (ZANAFLEX) 2 MG tablet Take 1 tablet (2 mg total) by mouth every 8 (eight) hours as needed for muscle spasms. 12/31/17   Marshia Ly, PA-C  traMADol (ULTRAM) 50 MG tablet Take 1 tablet (50 mg total) by mouth every 6 (six) hours as needed for moderate pain. 01/04/18   Marshia Ly, PA-C    Allergies    Patient has no known allergies.  Review of Systems   Review of Systems  Constitutional:  Negative for chills, diaphoresis, fatigue and fever.  HENT:  Negative for congestion, sore throat and trouble swallowing.   Eyes:  Negative for pain and visual disturbance.  Respiratory:  Negative for cough, shortness of breath and wheezing.   Cardiovascular:  Negative for chest pain, palpitations and leg swelling.  Gastrointestinal:  Negative for  abdominal distention, abdominal pain, diarrhea, nausea and vomiting.  Genitourinary:  Negative for difficulty urinating.  Musculoskeletal:  Positive for myalgias. Negative for back pain, neck pain and neck stiffness.  Skin:  Negative for pallor.  Neurological:  Negative for dizziness, speech difficulty, weakness and headaches.  Psychiatric/Behavioral:  Negative for confusion.    Physical Exam Updated Vital Signs BP (!) 142/59   Pulse 75   Temp 97.7 F (36.5 C) (Oral)   Resp 18   SpO2 98%   Physical Exam Constitutional:      General: She  is in acute distress.     Appearance: Normal appearance. She is ill-appearing. She is not toxic-appearing or diaphoretic.     Comments: Patient is shaking and shivering in bed, appears acutely ill.  HENT:     Mouth/Throat:     Mouth: Mucous membranes are moist.     Pharynx: Oropharynx is clear.  Eyes:     General: No scleral icterus.    Extraocular Movements: Extraocular movements intact.     Pupils: Pupils are equal, round, and reactive to light.  Cardiovascular:     Rate and Rhythm: Normal rate and regular rhythm.     Pulses: Normal pulses.     Heart sounds: Normal heart sounds.  Pulmonary:     Effort: Pulmonary effort is normal. No respiratory distress.     Breath sounds: No stridor. Wheezing present. No rhonchi or rales.  Chest:     Chest wall: No tenderness.  Abdominal:     General: Abdomen is flat. There is no distension.     Palpations: Abdomen is soft.     Tenderness: There is no abdominal tenderness. There is no guarding or rebound.  Musculoskeletal:        General: Swelling and tenderness present. Normal range of motion.     Cervical back: Normal range of motion and neck supple. No rigidity.     Right lower leg: No edema.     Left lower leg: Edema present.     Comments: Left lower extremity with edema extending to knee, erythema and warmth noted throughout.  Extreme tenderness to palpation throughout leg.  Some tenderness with  palpation in thigh as well.  Normal range of motion to knee and ankle.  Good strength to plantar flexion.  Good leg raise.  Normal sensation throughout.  Skin:    General: Skin is warm and dry.     Capillary Refill: Capillary refill takes less than 2 seconds.     Coloration: Skin is not pale.  Neurological:     General: No focal deficit present.     Mental Status: She is alert and oriented to person, place, and time.  Psychiatric:        Mood and Affect: Mood normal.        Behavior: Behavior normal.     ED Results / Procedures / Treatments   Labs (all labs ordered are listed, but only abnormal results are displayed) Labs Reviewed  CBC WITH DIFFERENTIAL/PLATELET - Abnormal; Notable for the following components:      Result Value   WBC 20.1 (*)    RBC 3.09 (*)    Hemoglobin 10.5 (*)    HCT 32.5 (*)    MCV 105.2 (*)    RDW 16.1 (*)    Neutro Abs 17.8 (*)    Monocytes Absolute 1.2 (*)    Abs Immature Granulocytes 0.12 (*)    All other components within normal limits  COMPREHENSIVE METABOLIC PANEL - Abnormal; Notable for the following components:   Sodium 126 (*)    Chloride 94 (*)    Glucose, Bld 187 (*)    BUN 43 (*)    Creatinine, Ser 2.32 (*)    Calcium 8.3 (*)    Total Protein 6.3 (*)    Albumin 3.3 (*)    GFR, Estimated 22 (*)    All other components within normal limits  CULTURE, BLOOD (ROUTINE X 2)  CULTURE, BLOOD (ROUTINE X 2)  LACTIC ACID, PLASMA  LACTIC ACID, PLASMA  PROTIME-INR  APTT  URINALYSIS, ROUTINE W REFLEX MICROSCOPIC    EKG None  Radiology VAS US LOWER EXTREMITY VENOUS (DVT) ([facility] and [facility] 7a-7p)  Result Date: 10/30/2020  Lower Venous DVT Study Patient Name:  Baron SaneBONNIE F Hollars  Date of Exam:   10/30/2020 Medical Rec #: [id number]           Accession #:    [id number] Date of Birth: 03-30-1945           Patient Gender: F Patient Age:   [age] years [facility] Procedure:      VAS US LOWER EXTREMITY VENOUS (DVT) Referring Phys: Leonie DouglasJOHANA  SOTO --------------------------------------------------------------------------------  Comparison Study: Previous exam 02/20/2017 negative Performing Technologist: Jean Rosenthalachel Hodge RDMS, RVT  Examination Guidelines: A complete evaluation includes B-mode imaging, spectral Doppler, color Doppler, and power Doppler as needed of all accessible portions of each vessel. Bilateral testing is considered an integral part of a complete examination. Limited examinations for reoccurring indications may be performed as noted. The reflux portion of the exam is performed with the patient in reverse Trendelenburg.  +-----+---------------+---------+-----------+----------+--------------+ RIGHTCompressibilityPhasicitySpontaneityPropertiesThrombus Aging +-----+---------------+---------+-----------+----------+--------------+ CFV  Full           Yes      Yes                                 +-----+---------------+---------+-----------+----------+--------------+   +---------+---------------+---------+-----------+----------+---------------+ LEFT     CompressibilityPhasicitySpontaneityPropertiesThrombus Aging  +---------+---------------+---------+-----------+----------+---------------+ CFV      Full           Yes      Yes                                  +---------+---------------+---------+-----------+----------+---------------+ SFJ      Full                                                         +---------+---------------+---------+-----------+----------+---------------+ FV Prox  Full                                                         +---------+---------------+---------+-----------+----------+---------------+ FV Mid   Full           Yes      Yes                                  +---------+---------------+---------+-----------+----------+---------------+ FV DistalFull           Yes      Yes                                   +---------+---------------+---------+-----------+----------+---------------+ PFV      Full                                                         +---------+---------------+---------+-----------+----------+---------------+  POP      Full           Yes      Yes                                  +---------+---------------+---------+-----------+----------+---------------+ PTV                                                   patent by color +---------+---------------+---------+-----------+----------+---------------+ PERO                                                  patent by color +---------+---------------+---------+-----------+----------+---------------+     *See table(s) above for measurements and observations. Electronically signed by Coral Else MD on 10/30/2020 at 8:10:50 PM.    Final     Procedures Procedures   Medications Ordered in ED Medications  cefTRIAXone (ROCEPHIN) 2 g in sodium chloride 0.9 % 100 mL IVPB (has no administration in time range)  sodium chloride 0.9 % bolus 1,000 mL (has no administration in time range)  sodium chloride 0.9 % bolus 1,000 mL (has no administration in time range)    ED Course  I have reviewed the triage vital signs and the nursing notes.  Pertinent labs & imaging results that were available during my care of the patient were reviewed by me and considered in my medical decision making (see chart for details).    MDM Rules/Calculators/A&P                           Patient presents to the emergency department today for leg pain, patient appears acutely ill.  Left leg with edema and tenderness palpation, favor cellulitis over DVT.  Patient appears septic, however does not meet SIRS.  Will initiate sepsis order set.  Work-up today with white count of 20.1, other derangements from CMP include sodium of 126, creatinine of 2.32, baseline around 1.  Fluids initiated, broad-spectrum IV antibiotics initiated.  Lactic acid pending.   Patient states that she does not want any IVs, states that she wants to go home.  Did try reasoning with her and her husband who wants her to stay.  Patient is able to make her own medical decisions, is alert and oriented x3, states that " if God wants her to leave then that is what she supposed to do."  I did express to patient multiple times that she will most likely die if she were to leave, patient expressed understanding, states that she wants to leave and wants to die at home if that is what supposed to happen.  I did express that she is septic and she has a blood infectio with acute renal injury, she expressed understanding and is becoming agitated about being here.  Dr. Rodena Medin, my attending did also try reasoning with her without success.  Patient will leave AGAINST MEDICAL ADVICE, Did also offer her Dalvance, however patient does not want injection at this time.  Patient is able to make medical decisions at this time.  Final Clinical Impression(s) / ED Diagnoses Final diagnoses:  Left leg cellulitis  AKI (acute kidney  injury) [facility])    Rx / DC Orders ED Discharge Orders     None        Farrel Gordon, PA-C 10/30/20 2052    Wynetta Fines, MD 11/02/20 [phone]

## 2020-10-30 NOTE — ED Notes (Addendum)
Pt left AMA. AMA form completed on computer.

## 2020-10-30 NOTE — ED Provider Notes (Signed)
Emergency Medicine Provider Triage Evaluation Note  Marilyn Sexton , a 75 y.o. female  was evaluated in triage.  Pt complains of left leg swelling along with pain.  Reports she was in her home on Monday, was using a separate bathroom, unsure what happened but she states she did not fall.  There is swelling along with erythema noted to the left lower leg.  Currently on no blood thinners  Review of Systems  Positive: Leg swelling, shortness of breath Negative: Chest pain, fever  Physical Exam  BP (!) 110/97 (BP Location: Right Arm)   Pulse 84   Temp 97.8 F (36.6 C) (Oral)   Resp 17   SpO2 92%  Gen:   Awake, no distress   Resp:  Normal effort  MSK:   Moves extremities without difficulty  Other:  Left leg with some 1+ pitting edema, erythema extending below the knee onto the foot.  Medical Decision Making  Medically screening exam initiated at 2:20 PM.  Appropriate orders placed.  JENNA ROUTZAHN was informed that the remainder of the evaluation will be completed by another provider, this initial triage assessment does not replace that evaluation, and the importance of remaining in the ED until their evaluation is complete.  Patient here with left leg swelling for the past 2 days.  Unknown trauma at this time, with surrounding erythema, some concern for cellulitis versus DVT.  Will obtain labs along with ultrasound at this time.   Claude Manges, PA-C 10/30/20 1425    Mancel Bale, MD 10/30/20 [phone]

## 2020-10-30 NOTE — ED Notes (Signed)
No response

## 2020-10-31 ENCOUNTER — Emergency Department (HOSPITAL_COMMUNITY): Payer: Medicare Other

## 2020-10-31 ENCOUNTER — Inpatient Hospital Stay (HOSPITAL_COMMUNITY)
Admission: EM | Admit: 2020-10-31 | Discharge: 2020-11-05 | DRG: 603 | Disposition: A | Discharge status: Home / Self Care | Payer: Medicare Other | Attending: Internal Medicine | Admitting: Internal Medicine

## 2020-10-31 DIAGNOSIS — L0390 Cellulitis, unspecified: Secondary | ICD-10-CM | POA: Diagnosis not present

## 2020-10-31 DIAGNOSIS — F32A Depression, unspecified: Secondary | ICD-10-CM | POA: Diagnosis present

## 2020-10-31 DIAGNOSIS — E538 Deficiency of other specified B group vitamins: Secondary | ICD-10-CM | POA: Diagnosis present

## 2020-10-31 DIAGNOSIS — J449 Chronic obstructive pulmonary disease, unspecified: Secondary | ICD-10-CM | POA: Diagnosis present

## 2020-10-31 DIAGNOSIS — I252 Old myocardial infarction: Secondary | ICD-10-CM | POA: Diagnosis not present

## 2020-10-31 DIAGNOSIS — Z87891 Personal history of nicotine dependence: Secondary | ICD-10-CM | POA: Diagnosis not present

## 2020-10-31 DIAGNOSIS — Z794 Long term (current) use of insulin: Secondary | ICD-10-CM

## 2020-10-31 DIAGNOSIS — E1151 Type 2 diabetes mellitus with diabetic peripheral angiopathy without gangrene: Secondary | ICD-10-CM | POA: Diagnosis present

## 2020-10-31 DIAGNOSIS — D509 Iron deficiency anemia, unspecified: Secondary | ICD-10-CM | POA: Diagnosis present

## 2020-10-31 DIAGNOSIS — R609 Edema, unspecified: Secondary | ICD-10-CM

## 2020-10-31 DIAGNOSIS — Z20822 Contact with and (suspected) exposure to covid-19: Secondary | ICD-10-CM | POA: Diagnosis present

## 2020-10-31 DIAGNOSIS — E119 Type 2 diabetes mellitus without complications: Secondary | ICD-10-CM | POA: Diagnosis not present

## 2020-10-31 DIAGNOSIS — D649 Anemia, unspecified: Secondary | ICD-10-CM | POA: Diagnosis present

## 2020-10-31 DIAGNOSIS — E871 Hypo-osmolality and hyponatremia: Secondary | ICD-10-CM | POA: Diagnosis present

## 2020-10-31 DIAGNOSIS — Z955 Presence of coronary angioplasty implant and graft: Secondary | ICD-10-CM | POA: Diagnosis not present

## 2020-10-31 DIAGNOSIS — Z96651 Presence of right artificial knee joint: Secondary | ICD-10-CM | POA: Diagnosis present

## 2020-10-31 DIAGNOSIS — L03116 Cellulitis of left lower limb: Principal | ICD-10-CM | POA: Diagnosis present

## 2020-10-31 DIAGNOSIS — N179 Acute kidney failure, unspecified: Secondary | ICD-10-CM | POA: Diagnosis present

## 2020-10-31 DIAGNOSIS — Z79899 Other long term (current) drug therapy: Secondary | ICD-10-CM | POA: Diagnosis not present

## 2020-10-31 DIAGNOSIS — Z7982 Long term (current) use of aspirin: Secondary | ICD-10-CM | POA: Diagnosis not present

## 2020-10-31 DIAGNOSIS — I739 Peripheral vascular disease, unspecified: Secondary | ICD-10-CM | POA: Diagnosis not present

## 2020-10-31 DIAGNOSIS — B999 Unspecified infectious disease: Secondary | ICD-10-CM

## 2020-10-31 DIAGNOSIS — I2510 Atherosclerotic heart disease of native coronary artery without angina pectoris: Secondary | ICD-10-CM | POA: Diagnosis present

## 2020-10-31 DIAGNOSIS — I10 Essential (primary) hypertension: Secondary | ICD-10-CM | POA: Diagnosis present

## 2020-10-31 LAB — CBC WITH DIFFERENTIAL/PLATELET
Abs Immature Granulocytes: 0.07 10*3/uL (ref 0.00–0.07)
Basophils Absolute: 0 10*3/uL (ref 0.0–0.1)
Basophils Relative: 0 %
Eosinophils Absolute: 0 10*3/uL (ref 0.0–0.5)
Eosinophils Relative: 0 %
HCT: 29.1 % — ABNORMAL LOW (ref 36.0–46.0)
Hemoglobin: 9.7 g/dL — ABNORMAL LOW (ref 12.0–15.0)
Immature Granulocytes: 1 %
Lymphocytes Relative: 7 %
Lymphs Abs: 1 10*3/uL (ref 0.7–4.0)
MCH: 34 pg (ref 26.0–34.0)
MCHC: 33.3 g/dL (ref 30.0–36.0)
MCV: 102.1 fL — ABNORMAL HIGH (ref 80.0–100.0)
Monocytes Absolute: 1 10*3/uL (ref 0.1–1.0)
Monocytes Relative: 7 %
Neutro Abs: 12.9 10*3/uL — ABNORMAL HIGH (ref 1.7–7.7)
Neutrophils Relative %: 85 %
Platelets: 249 10*3/uL (ref 150–400)
RBC: 2.85 MIL/uL — ABNORMAL LOW (ref 3.87–5.11)
RDW: 16 % — ABNORMAL HIGH (ref 11.5–15.5)
WBC: 15 10*3/uL — ABNORMAL HIGH (ref 4.0–10.5)
nRBC: 0 % (ref 0.0–0.2)

## 2020-10-31 LAB — BASIC METABOLIC PANEL
Anion gap: 10 (ref 5–15)
BUN: 36 mg/dL — ABNORMAL HIGH (ref 8–23)
CO2: 22 mmol/L (ref 22–32)
Calcium: 8.7 mg/dL — ABNORMAL LOW (ref 8.9–10.3)
Chloride: 95 mmol/L — ABNORMAL LOW (ref 98–111)
Creatinine, Ser: 1.71 mg/dL — ABNORMAL HIGH (ref 0.44–1.00)
GFR, Estimated: 31 mL/min — ABNORMAL LOW (ref >60)
Glucose, Bld: 138 mg/dL — ABNORMAL HIGH (ref 70–99)
Potassium: 4.2 mmol/L (ref 3.5–5.1)
Sodium: 127 mmol/L — ABNORMAL LOW (ref 135–145)

## 2020-10-31 LAB — LACTIC ACID, PLASMA
Lactic Acid, Venous: 1.9 mmol/L (ref 0.5–1.9)
Lactic Acid, Venous: 1.1 mmol/L (ref 0.5–1.9)

## 2020-10-31 MED ORDER — LACTATED RINGERS IV BOLUS
1000.0000 mL | Freq: Once | INTRAVENOUS | Status: AC
  Administered 2020-11-01: 1000 mL via INTRAVENOUS

## 2020-10-31 MED ORDER — PIPERACILLIN-TAZOBACTAM 3.375 G IVPB
3.3750 g | Freq: Once | INTRAVENOUS | Status: AC
  Administered 2020-11-01: 3.375 g via INTRAVENOUS
  Filled 2020-10-31: qty 50

## 2020-10-31 MED ORDER — IPRATROPIUM-ALBUTEROL 0.5-2.5 (3) MG/3ML IN SOLN
3.0000 mL | Freq: Once | RESPIRATORY_TRACT | Status: DC
  Filled 2020-10-31: qty 3

## 2020-10-31 MED ORDER — VANCOMYCIN HCL 1750 MG/350ML IV SOLN
1750.0000 mg | Freq: Once | INTRAVENOUS | Status: AC
  Administered 2020-11-01: 1750 mg via INTRAVENOUS
  Filled 2020-10-31 (×2): qty 350

## 2020-10-31 NOTE — ED Provider Notes (Signed)
[facility] EMERGENCY DEPARTMENT Provider Note   CSN: [id number] Arrival date & time: 10/31/20  1829     History Chief Complaint  Patient presents with   Cellulitis    Marilyn Sexton is a 75 y.o. female.  HPI  75 year old female with a past medical history of diabetes, hypertension, MI, thyroid disease, chronic hyponatremia, COPD presenting to the emergency department with left lower extremity redness, swelling, and pain.  Patient states that her symptoms have been present for the past 4 to 5 days.  They are progressively worsening.  She noticed that her symptoms onset gradually.  They are worsened with weightbearing, improved with rest.  Patient came to the emergency department last night, where she had an elevated white blood cell count and acute kidney injury.  It was recommended that she come into the hospital to get IV antibiotics, but she left AGAINST MEDICAL ADVICE.  Patient reports that her pain and swelling has continued to worsen and she is unable to walk and complete her ADLs at home at this point.  She is uncertain if she has had any fever or chills.  She denies any nausea, vomiting, or diarrhea.  Past Medical History:  Diagnosis Date   Diabetes mellitus without complication (HCC)    Dyspnea    Hypertension    Myocardial infarction [facility])    Thyroid disease     Patient Active Problem List   Diagnosis Date Noted   Hyponatremia 01/02/2018   AKI (acute kidney injury) (HCC) 01/02/2018   Type 2 diabetes mellitus without complication (HCC) 01/02/2018   COPD with acute exacerbation (HCC) 01/02/2018   Primary osteoarthritis of right knee 12/31/2017   Acute coronary syndrome (HCC) 08/15/2017   Old complex tear of lateral meniscus of right knee    Loose body in knee, right knee    Bipolar affective disorder, manic, moderate (HCC) 07/23/2014   History of medication noncompliance    Manic behavior (HCC)     Past Surgical History:  Procedure Laterality  Date   CARDIAC CATHETERIZATION     CHOLECYSTECTOMY     coronary stents     EYE SURGERY     bilateral cataracts   KNEE ARTHROSCOPY Right 12/16/2016   Procedure: RIGHT KNEE ARTHROSCOPY AND DEBRIDEMENT;  Surgeon: Nadara Mustard, MD;  Location: [facility] OR;  Service: Orthopedics;  Laterality: Right;   TOTAL KNEE ARTHROPLASTY Right 12/31/2017   Procedure: RIGHT TOTAL KNEE ARTHROPLASTY;  Surgeon: Jodi Geralds, MD;  Location: [facility] ORS;  Service: Orthopedics;  Laterality: Right;  Adductor Block   TUBAL LIGATION       OB History   No obstetric history on file.     No family history on file.  Social History   Tobacco Use   Smoking status: Former    Packs/day: 1.00    Years: 44.00    Pack years: 44.00    Types: Cigarettes    Quit date: 2006    Years since quitting: 16.6   Smokeless tobacco: Never  Vaping Use   Vaping Use: Never used  Substance Use Topics   Alcohol use: No   Drug use: No    Home Medications Prior to Admission medications   Medication Sig Start Date End Date Taking? Authorizing Provider  amLODipine (NORVASC) 5 MG tablet Take 5 mg by mouth daily.    [provider]  aspirin EC 325 MG tablet Take 1 tablet (325 mg total) by mouth 2 (two) times daily after a meal. Take x  1 month post op to decrease risk of blood clots. 12/31/17   Marshia Ly, PA-C  atorvastatin (LIPITOR) 40 MG tablet Take 1 tablet (40 mg total) by mouth daily at 6 PM. 08/17/17   Rinaldo Cloud, MD  benazepril (LOTENSIN) 20 MG tablet Take 20 mg by mouth 2 (two) times daily.    [provider]  benztropine (COGENTIN) 0.5 MG tablet Take 1 tablet (0.5 mg total) by mouth 2 (two) times daily. 07/23/14   Charm Rings, NP  docusate sodium (COLACE) 100 MG capsule Take 1 capsule (100 mg total) by mouth 2 (two) times daily. 12/31/17   Marshia Ly, PA-C  furosemide (LASIX) 20 MG tablet Take 20 mg by mouth daily.    [provider]  hydrochlorothiazide (HYDRODIURIL) 25 MG tablet Take 25 mg  by mouth daily.    [provider]  insulin lispro (HUMALOG) 100 UNIT/ML injection Inject 12-14 Units into the skin See admin instructions. Inject 12 units SQ in the morning and inject 14 units SQ at night     [provider]  levothyroxine (SYNTHROID, LEVOTHROID) 75 MCG tablet Take 75 mcg by mouth daily before breakfast.     [provider]  metFORMIN (GLUCOPHAGE) 1000 MG tablet Take 1,000 mg by mouth 2 (two) times daily with a meal.     [provider]  metoprolol tartrate (LOPRESSOR) 50 MG tablet Take 50 mg by mouth 2 (two) times daily.    [provider]  mirabegron ER (MYRBETRIQ) 50 MG TB24 tablet Take 50 mg by mouth daily.     [provider]  nitroGLYCERIN (NITROSTAT) 0.4 MG SL tablet Place 1 tablet (0.4 mg total) under the tongue every 5 (five) minutes x 3 doses as needed for chest pain. 08/16/17   Rinaldo Cloud, MD  oxyCODONE-acetaminophen (PERCOCET/ROXICET) 5-325 MG tablet Take 1-2 tablets by mouth every 6 (six) hours as needed for severe pain. 12/31/17   Marshia Ly, PA-C  Propylene Glycol (SYSTANE BALANCE) 0.6 % SOLN Place 2 drops into both eyes daily as needed (for dry eyes).     [provider]  QUEtiapine (SEROQUEL) 50 MG tablet Take 25 mg by mouth at bedtime.    [provider]  risperiDONE (RISPERDAL) 1 MG tablet Take 1 mg by mouth 2 (two) times daily.    [provider]  tiZANidine (ZANAFLEX) 2 MG tablet Take 1 tablet (2 mg total) by mouth every 8 (eight) hours as needed for muscle spasms. 12/31/17   Marshia Ly, PA-C  traMADol (ULTRAM) 50 MG tablet Take 1 tablet (50 mg total) by mouth every 6 (six) hours as needed for moderate pain. 01/04/18   Marshia Ly, PA-C    Allergies    Patient has no known allergies.  Review of Systems   Review of Systems  Constitutional:  Negative for chills and fever.  HENT:  Negative for ear pain and sore throat.   Eyes:  Negative for pain and visual  disturbance.  Respiratory:  Negative for cough and shortness of breath.   Cardiovascular:  Positive for leg swelling. Negative for chest pain and palpitations.  Gastrointestinal:  Negative for abdominal pain and vomiting.  Genitourinary:  Negative for dysuria and hematuria.  Musculoskeletal:  Negative for arthralgias and back pain.  Skin:  Positive for color change. Negative for rash.  Neurological:  Negative for seizures and syncope.  All other systems reviewed and are negative.  Physical Exam Updated Vital Signs BP 122/82 (BP Location: Right Arm)  Pulse 98   Temp 99.2 F (37.3 C) (Oral)   Resp 20   SpO2 98%   Physical Exam Vitals and nursing note reviewed.  Constitutional:      General: She is not in acute distress.    Appearance: Normal appearance. She is well-developed. She is obese. She is not ill-appearing or toxic-appearing.  HENT:     Head: Normocephalic and atraumatic.  Eyes:     Conjunctiva/sclera: Conjunctivae normal.  Cardiovascular:     Rate and Rhythm: Normal rate and regular rhythm.     Heart sounds: No murmur heard. Pulmonary:     Effort: Pulmonary effort is normal. No respiratory distress.     Breath sounds: Wheezing (scattered, end expiratory) present.  Abdominal:     Palpations: Abdomen is soft.     Tenderness: There is no abdominal tenderness.  Musculoskeletal:     Cervical back: Neck supple.     Right lower leg: No edema.     Left lower leg: Edema present.     Comments: Diffuse swelling and splotchy erythema to the left lower extremity starting just proximal to the left knee.  Is diffusely tender to palpation.  There is some mild ecchymosis around the left heel where it contacts the bed.  No crepitus.  No obvious open wounds.  2+ DP pulses.  Right leg appears atraumatic.  Skin:    General: Skin is warm and dry.  Neurological:     Mental Status: She is alert.    ED Results / Procedures / Treatments   Labs (all labs ordered are listed, but only  abnormal results are displayed) Labs Reviewed  BASIC METABOLIC PANEL - Abnormal; Notable for the following components:      Result Value   Sodium 127 (*)    Chloride 95 (*)    Glucose, Bld 138 (*)    BUN 36 (*)    Creatinine, Ser 1.71 (*)    Calcium 8.7 (*)    GFR, Estimated 31 (*)    All other components within normal limits  CBC WITH DIFFERENTIAL/PLATELET - Abnormal; Notable for the following components:   WBC 15.0 (*)    RBC 2.85 (*)    Hemoglobin 9.7 (*)    HCT 29.1 (*)    MCV 102.1 (*)    RDW 16.0 (*)    Neutro Abs 12.9 (*)    All other components within normal limits  CULTURE, BLOOD (ROUTINE X 2)  CULTURE, BLOOD (ROUTINE X 2)  SARS CORONAVIRUS 2 (TAT 6-24 HRS)  LACTIC ACID, PLASMA  LACTIC ACID, PLASMA    EKG None  Radiology DG Chest 1 View  Result Date: 10/31/2020 CLINICAL DATA:  Infection. Patient reports chest pain and shortness of breath EXAM: CHEST  1 VIEW COMPARISON:  01/01/2018 FINDINGS: The cardiomediastinal contours are normal. Atherosclerosis of the aortic arch. Similar hyperinflation to prior exam. Mild central bronchial thickening. Pulmonary vasculature is normal. No consolidation, pleural effusion, or pneumothorax. No acute osseous abnormalities are seen. IMPRESSION: Mild central bronchial thickening and hyperinflation, may represent bronchitis, asthma, or sequela of smoking. No evidence of pneumonia. Electronically Signed   By: Narda RutherfordMelanie  Sanford M.D.   On: 10/31/2020 19:11   VAS US LOWER EXTREMITY VENOUS (DVT) ([facility] 7a-7p)  Result Date: 10/30/2020  Lower Venous DVT Study Patient Name:  Baron SaneBONNIE F Marchetti  Date of Exam:   10/30/2020 Medical Rec #: [id number]           Accession #:    [id number] Date  of Birth: [birth year]           Patient Gender: F Patient Age:   63 years Exam Location:  [facility] Procedure:      VAS [place] LOWER EXTREMITY VENOUS (DVT) Referring Phys: Leonie Douglas SOTO  --------------------------------------------------------------------------------  Comparison Study: Previous exam 02/20/2017 negative Performing Technologist: Jean Rosenthal RDMS, RVT  Examination Guidelines: A complete evaluation includes B-mode imaging, spectral Doppler, color Doppler, and power Doppler as needed of all accessible portions of each vessel. Bilateral testing is considered an integral part of a complete examination. Limited examinations for reoccurring indications may be performed as noted. The reflux portion of the exam is performed with the patient in reverse Trendelenburg.  +-----+---------------+---------+-----------+----------+--------------+ RIGHTCompressibilityPhasicitySpontaneityPropertiesThrombus Aging +-----+---------------+---------+-----------+----------+--------------+ CFV  Full           Yes      Yes                                 +-----+---------------+---------+-----------+----------+--------------+   +---------+---------------+---------+-----------+----------+---------------+ LEFT     CompressibilityPhasicitySpontaneityPropertiesThrombus Aging  +---------+---------------+---------+-----------+----------+---------------+ CFV      Full           Yes      Yes                                  +---------+---------------+---------+-----------+----------+---------------+ SFJ      Full                                                         +---------+---------------+---------+-----------+----------+---------------+ FV Prox  Full                                                         +---------+---------------+---------+-----------+----------+---------------+ FV Mid   Full           Yes      Yes                                  +---------+---------------+---------+-----------+----------+---------------+ FV DistalFull           Yes      Yes                                   +---------+---------------+---------+-----------+----------+---------------+ PFV      Full                                                         +---------+---------------+---------+-----------+----------+---------------+ POP      Full           Yes      Yes                                  +---------+---------------+---------+-----------+----------+---------------+  PTV                                                   patent by color +---------+---------------+---------+-----------+----------+---------------+ PERO                                                  patent by color +---------+---------------+---------+-----------+----------+---------------+     *See table(s) above for measurements and observations. Electronically signed by Coral Else MD on 10/30/2020 at 8:10:50 PM.    Final     Procedures Procedures   Medications Ordered in ED Medications - No data to display  ED Course  I have reviewed the triage vital signs and the nursing notes.  Pertinent labs & imaging results that were available during my care of the patient were reviewed by me and considered in my medical decision making (see chart for details).    MDM Rules/Calculators/A&P                           75 year old female with above past medical history presenting to the emergency department with left leg pain, swelling, erythema.  Vital signs reviewed, within acceptable limits on arrival.  Physical exam most notable for diffuse swelling and erythema to the left lower extremity.  There is no crepitus.  Although there is some ecchymosis on the heel, it is in the contact area.  I believe that a necrotizing infection is less likely.  Obtained a plain film of the left foot and there is no gas.  DVT ultrasound was obtained yesterday and was negative.  The patient has some mild scattered wheezing on my exam although she denies any shortness of breath, chest x-ray obtained that was negative for any  pneumonia or pneumothorax.  Given her wheezing and history of tobacco use, will empirically provide bronchodilator therapy here.  Overall, presentation seems most consistent with significant cellulitis of the left lower extremity.  I believe that given her chronic medical illnesses and severity of presentation, she warrants admission for IV antibiotics.  Ordered vancomycin and Zosyn.  Handoff given to the admitting team.  Final Clinical Impression(s) / ED Diagnoses Final diagnoses:  Infection    Rx / DC Orders ED Discharge Orders     None        Lenard Lance, MD 10/31/20 2224    Cathren Laine, MD 10/31/20 [phone]

## 2020-10-31 NOTE — ED Notes (Addendum)
This RN attempted twice to obtain IV access - no success. IV team consult to be put in - MD notified

## 2020-10-31 NOTE — ED Triage Notes (Signed)
Pt here via [facility], pain in L leg x4 days, redness & swelling in same x3 days, was here last night, left AMA due to wait time. DVT doppler negative, was advised of infection but refused. Partially weight-bearing, usually ambulatory w no issue.   CBG 241, hx DM2 HR 120 134/56  22g R hand, fluid en route

## 2020-10-31 NOTE — ED Provider Notes (Signed)
Emergency Medicine Provider Triage Evaluation Note  Marilyn Sexton , a 75 y.o. female  was evaluated in triage.  Pt complains of left leg pain.  Review of Systems  Positive: Left leg pain, swelling, redness Negative: Fever, chills, recent trauma  Physical Exam  BP 122/82 (BP Location: Right Arm)   Pulse 98   Temp 99.2 F (37.3 C) (Oral)   Resp 20   SpO2 98%  Gen:   Awake, no distress   Resp:  Normal effort  MSK:   Moves extremities without difficulty  Other:  LLE edematous and erythematous, ttp.  Intact dp pulse  Medical Decision Making  Medically screening exam initiated at 6:30 PM.  Appropriate orders placed.  Marilyn Sexton was informed that the remainder of the evaluation will be completed by another provider, this initial triage assessment does not replace that evaluation, and the importance of remaining in the ED until their evaluation is complete.  Pt with L leg pain swelling and redness x 5 days.  Was seen yesterday for same.  Negative DVT study, and was diagnosed with cellulitis.  Pt was to be admitted however left AMA, return today due to worsening pain and sxs.  Was offered Dalvance yesterday but refused.    Fayrene Helper, PA-C 10/31/20 1834    Koleen Distance, MD 10/31/20 [phone]

## 2020-11-01 ENCOUNTER — Encounter (HOSPITAL_COMMUNITY): Payer: Self-pay | Admitting: Internal Medicine

## 2020-11-01 ENCOUNTER — Other Ambulatory Visit: Payer: Self-pay

## 2020-11-01 ENCOUNTER — Inpatient Hospital Stay (HOSPITAL_COMMUNITY): Payer: Medicare Other

## 2020-11-01 DIAGNOSIS — Z794 Long term (current) use of insulin: Secondary | ICD-10-CM

## 2020-11-01 DIAGNOSIS — N179 Acute kidney failure, unspecified: Secondary | ICD-10-CM

## 2020-11-01 DIAGNOSIS — L0390 Cellulitis, unspecified: Secondary | ICD-10-CM | POA: Diagnosis present

## 2020-11-01 DIAGNOSIS — E119 Type 2 diabetes mellitus without complications: Secondary | ICD-10-CM

## 2020-11-01 DIAGNOSIS — D649 Anemia, unspecified: Secondary | ICD-10-CM | POA: Diagnosis present

## 2020-11-01 LAB — CBC
HCT: 28.2 % — ABNORMAL LOW (ref 36.0–46.0)
Hemoglobin: 9.6 g/dL — ABNORMAL LOW (ref 12.0–15.0)
MCH: 34 pg (ref 26.0–34.0)
MCHC: 34 g/dL (ref 30.0–36.0)
MCV: 100 fL (ref 80.0–100.0)
Platelets: 248 10*3/uL (ref 150–400)
RBC: 2.82 MIL/uL — ABNORMAL LOW (ref 3.87–5.11)
RDW: 15.9 % — ABNORMAL HIGH (ref 11.5–15.5)
WBC: 12.6 10*3/uL — ABNORMAL HIGH (ref 4.0–10.5)
nRBC: 0 % (ref 0.0–0.2)

## 2020-11-01 LAB — COMPREHENSIVE METABOLIC PANEL
ALT: 15 U/L (ref 0–44)
AST: 20 U/L (ref 15–41)
Albumin: 3 g/dL — ABNORMAL LOW (ref 3.5–5.0)
Alkaline Phosphatase: 74 U/L (ref 38–126)
Anion gap: 9 (ref 5–15)
BUN: 32 mg/dL — ABNORMAL HIGH (ref 8–23)
CO2: 23 mmol/L (ref 22–32)
Calcium: 8.6 mg/dL — ABNORMAL LOW (ref 8.9–10.3)
Chloride: 97 mmol/L — ABNORMAL LOW (ref 98–111)
Creatinine, Ser: 1.49 mg/dL — ABNORMAL HIGH (ref 0.44–1.00)
GFR, Estimated: 37 mL/min — ABNORMAL LOW (ref >60)
Glucose, Bld: 151 mg/dL — ABNORMAL HIGH (ref 70–99)
Potassium: 4 mmol/L (ref 3.5–5.1)
Sodium: 129 mmol/L — ABNORMAL LOW (ref 135–145)
Total Bilirubin: 1 mg/dL (ref 0.3–1.2)
Total Protein: 5.7 g/dL — ABNORMAL LOW (ref 6.5–8.1)

## 2020-11-01 LAB — SARS CORONAVIRUS 2 (TAT 6-24 HRS): SARS Coronavirus 2: NEGATIVE

## 2020-11-01 LAB — GLUCOSE, CAPILLARY
Glucose-Capillary: 185 mg/dL — ABNORMAL HIGH (ref 70–99)
Glucose-Capillary: 127 mg/dL — ABNORMAL HIGH (ref 70–99)
Glucose-Capillary: 186 mg/dL — ABNORMAL HIGH (ref 70–99)
Glucose-Capillary: 179 mg/dL — ABNORMAL HIGH (ref 70–99)
Glucose-Capillary: 107 mg/dL — ABNORMAL HIGH (ref 70–99)

## 2020-11-01 LAB — HEMOGLOBIN A1C
Hgb A1c MFr Bld: 6.4 % — ABNORMAL HIGH (ref 4.8–5.6)
Mean Plasma Glucose: 136.98 mg/dL

## 2020-11-01 LAB — IRON AND TIBC
Iron: 15 ug/dL — ABNORMAL LOW (ref 28–170)
Saturation Ratios: 7 % — ABNORMAL LOW (ref 10.4–31.8)
TIBC: 228 ug/dL — ABNORMAL LOW (ref 250–450)
UIBC: 213 ug/dL

## 2020-11-01 LAB — RETICULOCYTES
Immature Retic Fract: 18.1 % — ABNORMAL HIGH (ref 2.3–15.9)
RBC.: 2.72 MIL/uL — ABNORMAL LOW (ref 3.87–5.11)
Retic Count, Absolute: 38.6 10*3/uL (ref 19.0–186.0)
Retic Ct Pct: 1.4 % (ref 0.4–3.1)

## 2020-11-01 LAB — FERRITIN: Ferritin: 215 ng/mL (ref 11–307)

## 2020-11-01 LAB — VITAMIN B12: Vitamin B-12: 104 pg/mL — ABNORMAL LOW (ref 180–914)

## 2020-11-01 LAB — FOLATE: Folate: 15.9 ng/mL (ref >5.9)

## 2020-11-01 MED ORDER — RISPERIDONE 1 MG PO TABS
1.0000 mg | ORAL_TABLET | Freq: Two times a day (BID) | ORAL | Status: DC
  Administered 2020-11-01 – 2020-11-05 (×10): 1 mg via ORAL
  Filled 2020-11-01 (×12): qty 1

## 2020-11-01 MED ORDER — SODIUM CHLORIDE 0.9 % IV SOLN
2.0000 g | Freq: Two times a day (BID) | INTRAVENOUS | Status: DC
  Administered 2020-11-01: 2 g via INTRAVENOUS
  Filled 2020-11-01: qty 2

## 2020-11-01 MED ORDER — VANCOMYCIN HCL 750 MG/150ML IV SOLN
750.0000 mg | INTRAVENOUS | Status: DC
  Administered 2020-11-02 – 2020-11-03 (×2): 750 mg via INTRAVENOUS
  Filled 2020-11-01 (×2): qty 150

## 2020-11-01 MED ORDER — ALBUTEROL SULFATE (2.5 MG/3ML) 0.083% IN NEBU
2.5000 mg | INHALATION_SOLUTION | Freq: Four times a day (QID) | RESPIRATORY_TRACT | Status: DC | PRN
  Administered 2020-11-03: 2.5 mg via RESPIRATORY_TRACT
  Filled 2020-11-01: qty 3

## 2020-11-01 MED ORDER — SODIUM CHLORIDE 0.9 % IV SOLN
2.0000 g | Freq: Two times a day (BID) | INTRAVENOUS | Status: DC
  Administered 2020-11-01 – 2020-11-03 (×4): 2 g via INTRAVENOUS
  Filled 2020-11-01 (×4): qty 2

## 2020-11-01 MED ORDER — VANCOMYCIN HCL IN DEXTROSE 1-5 GM/200ML-% IV SOLN: 1000.0000 mg | INTRAVENOUS | Status: DC

## 2020-11-01 MED ORDER — VANCOMYCIN HCL IN DEXTROSE 1-5 GM/200ML-% IV SOLN: 1000.0000 mg | Freq: Once | INTRAVENOUS | Status: DC

## 2020-11-01 MED ORDER — ACETAMINOPHEN 325 MG PO TABS
650.0000 mg | ORAL_TABLET | Freq: Four times a day (QID) | ORAL | Status: DC | PRN
  Administered 2020-11-02 – 2020-11-05 (×2): 650 mg via ORAL
  Filled 2020-11-01 (×2): qty 2

## 2020-11-01 MED ORDER — BENZTROPINE MESYLATE 0.5 MG PO TABS
0.5000 mg | ORAL_TABLET | Freq: Two times a day (BID) | ORAL | Status: DC
Start: 2020-11-01 — End: 2020-11-05
  Administered 2020-11-01 – 2020-11-05 (×10): 0.5 mg via ORAL
  Filled 2020-11-01 (×12): qty 1

## 2020-11-01 MED ORDER — DOXEPIN HCL 10 MG/ML PO CONC
3.0000 mg | Freq: Every day | ORAL | Status: DC
  Administered 2020-11-01 – 2020-11-04 (×4): 3 mg via ORAL
  Filled 2020-11-01 (×5): qty 0.3

## 2020-11-01 MED ORDER — CYANOCOBALAMIN 1000 MCG/ML IJ SOLN
1000.0000 ug | Freq: Every day | INTRAMUSCULAR | Status: DC
  Administered 2020-11-02 – 2020-11-04 (×3): 1000 ug via INTRAMUSCULAR
  Filled 2020-11-01 (×5): qty 1

## 2020-11-01 MED ORDER — AMLODIPINE BESYLATE 5 MG PO TABS
5.0000 mg | ORAL_TABLET | Freq: Every day | ORAL | Status: DC
  Administered 2020-11-01 – 2020-11-05 (×5): 5 mg via ORAL
  Filled 2020-11-01 (×5): qty 1

## 2020-11-01 MED ORDER — ACETAMINOPHEN 650 MG RE SUPP: 650.0000 mg | Freq: Four times a day (QID) | RECTAL | Status: DC | PRN

## 2020-11-01 MED ORDER — METOPROLOL TARTRATE 50 MG PO TABS
50.0000 mg | ORAL_TABLET | Freq: Two times a day (BID) | ORAL | Status: DC
  Administered 2020-11-01 – 2020-11-05 (×10): 50 mg via ORAL
  Filled 2020-11-01 (×9): qty 1

## 2020-11-01 MED ORDER — INSULIN ASPART PROT & ASPART (70-30 MIX) 100 UNIT/ML ~~LOC~~ SUSP
20.0000 [IU] | Freq: Two times a day (BID) | SUBCUTANEOUS | Status: DC
  Administered 2020-11-01 – 2020-11-05 (×9): 20 [IU] via SUBCUTANEOUS
  Filled 2020-11-01 (×2): qty 10

## 2020-11-01 MED ORDER — IPRATROPIUM-ALBUTEROL 0.5-2.5 (3) MG/3ML IN SOLN
3.0000 mL | Freq: Two times a day (BID) | RESPIRATORY_TRACT | Status: DC
  Administered 2020-11-01 – 2020-11-05 (×8): 3 mL via RESPIRATORY_TRACT
  Filled 2020-11-01 (×8): qty 3

## 2020-11-01 MED ORDER — LACTATED RINGERS IV SOLN: INTRAVENOUS | Status: AC

## 2020-11-01 MED ORDER — MIRABEGRON ER 50 MG PO TB24
50.0000 mg | ORAL_TABLET | Freq: Every day | ORAL | Status: DC
  Administered 2020-11-01 – 2020-11-05 (×5): 50 mg via ORAL
  Filled 2020-11-01 (×5): qty 1

## 2020-11-01 MED ORDER — DOCUSATE SODIUM 100 MG PO CAPS
100.0000 mg | ORAL_CAPSULE | Freq: Two times a day (BID) | ORAL | Status: DC
  Administered 2020-11-01 – 2020-11-05 (×9): 100 mg via ORAL
  Filled 2020-11-01 (×10): qty 1

## 2020-11-01 MED ORDER — SODIUM CHLORIDE 0.9 % IV SOLN: INTRAVENOUS | Status: DC

## 2020-11-01 MED ORDER — NITROGLYCERIN 0.4 MG SL SUBL: 0.4000 mg | SUBLINGUAL_TABLET | SUBLINGUAL | Status: DC | PRN

## 2020-11-01 MED ORDER — INSULIN ASPART 100 UNIT/ML IJ SOLN
0.0000 [IU] | Freq: Three times a day (TID) | INTRAMUSCULAR | Status: DC
  Administered 2020-11-01: 2 [IU] via SUBCUTANEOUS
  Administered 2020-11-01: 1 [IU] via SUBCUTANEOUS
  Administered 2020-11-01: 2 [IU] via SUBCUTANEOUS
  Administered 2020-11-02: 3 [IU] via SUBCUTANEOUS
  Administered 2020-11-02: 1 [IU] via SUBCUTANEOUS
  Administered 2020-11-02: 2 [IU] via SUBCUTANEOUS
  Administered 2020-11-03: 1 [IU] via SUBCUTANEOUS
  Administered 2020-11-03 (×2): 2 [IU] via SUBCUTANEOUS
  Administered 2020-11-04 – 2020-11-05 (×5): 1 [IU] via SUBCUTANEOUS

## 2020-11-01 MED ORDER — IPRATROPIUM-ALBUTEROL 0.5-2.5 (3) MG/3ML IN SOLN
3.0000 mL | Freq: Four times a day (QID) | RESPIRATORY_TRACT | Status: DC
  Administered 2020-11-01: 3 mL via RESPIRATORY_TRACT
  Filled 2020-11-01: qty 3

## 2020-11-01 MED ORDER — ASPIRIN EC 81 MG PO TBEC
81.0000 mg | DELAYED_RELEASE_TABLET | Freq: Every day | ORAL | Status: DC
  Administered 2020-11-01 – 2020-11-05 (×5): 81 mg via ORAL
  Filled 2020-11-01 (×5): qty 1

## 2020-11-01 MED ORDER — HEPARIN SODIUM (PORCINE) 5000 UNIT/ML IJ SOLN
5000.0000 [IU] | Freq: Three times a day (TID) | INTRAMUSCULAR | Status: DC
  Administered 2020-11-01 – 2020-11-05 (×12): 5000 [IU] via SUBCUTANEOUS
  Filled 2020-11-01 (×13): qty 1

## 2020-11-01 NOTE — H&P (Signed)
History and Physical    Marilyn Sexton ZOX:[id number] DOB: Aug 08, 1945 DOA: 10/31/2020  PCP: Adrian Prince, MD  Patient coming from: Home.  Chief Complaint: Left leg erythema and swelling.  HPI: Marilyn Sexton is a 75 y.o. female with history of diabetes mellitus type 2, hypertension, CAD has been experiencing swelling and erythema of the left lower extremity knee below involving the ankle and foot.  Denies any trauma or insect bite.  Has been ongoing for the last 4 days.  Subjective feeling of fever chills.  Had come to the ER yesterday but left before completion of work-up.  Since patient's swelling is worsening patient decided to come back.  Dopplers were negative for DVT.  ED Course: In the ER patient had a temperature of 99 F labs showing leukocytosis of 15,000 which improved from yesterday of 20,000.  Lactic acid was normal creatinine improved from yesterday from 2.3-1.7.  Sodium 127.  Foot x-ray does not show any bony involvement or air.  Does show soft tissue shadows.  Patient had blood cultures drawn antibiotics started admitted for cellulitis.  COVID test is pending.  Review of Systems: As per HPI, rest all negative.   Past Medical History:  Diagnosis Date   Diabetes mellitus without complication (HCC)    Dyspnea    Hypertension    Myocardial infarction [facility])    Thyroid disease     Past Surgical History:  Procedure Laterality Date   CARDIAC CATHETERIZATION     CHOLECYSTECTOMY     coronary stents     EYE SURGERY     bilateral cataracts   KNEE ARTHROSCOPY Right 12/16/2016   Procedure: RIGHT KNEE ARTHROSCOPY AND DEBRIDEMENT;  Surgeon: Nadara Mustard, MD;  Location: [facility] OR;  Service: Orthopedics;  Laterality: Right;   TOTAL KNEE ARTHROPLASTY Right 12/31/2017   Procedure: RIGHT TOTAL KNEE ARTHROPLASTY;  Surgeon: Jodi Geralds, MD;  Location: [facility] ORS;  Service: Orthopedics;  Laterality: Right;  Adductor Block   TUBAL LIGATION       reports that she quit smoking about  16 years ago. Her smoking use included cigarettes. She has a 44.00 pack-year smoking history. She has never used smokeless tobacco. She reports that she does not drink alcohol and does not use drugs.  No Known Allergies  History reviewed. No pertinent family history.  Prior to Admission medications   Medication Sig Start Date End Date Taking? Authorizing Provider  amLODipine (NORVASC) 5 MG tablet Take 5 mg by mouth daily.   Yes [provider]  aspirin EC 81 MG tablet Take 81 mg by mouth daily. Swallow whole.   Yes [provider]  benazepril (LOTENSIN) 20 MG tablet Take 20 mg by mouth 2 (two) times daily.   Yes [provider]  benztropine (COGENTIN) 0.5 MG tablet Take 1 tablet (0.5 mg total) by mouth 2 (two) times daily. 07/23/14  Yes Charm Rings, NP  docusate sodium (COLACE) 100 MG capsule Take 1 capsule (100 mg total) by mouth 2 (two) times daily. 12/31/17  Yes Marshia Ly, PA-C  Doxepin HCl 3 MG TABS Take 1 tablet by mouth at bedtime. 09/13/20  Yes [provider]  furosemide (LASIX) 20 MG tablet Take 20 mg by mouth daily.   Yes [provider]  insulin lispro protamine-lispro (HUMALOG MIX 75/25) (75-25) 100 UNIT/ML SUSP injection Inject 20 Units into the skin in the morning and at bedtime.   Yes [provider]  metoprolol tartrate (LOPRESSOR) 50 MG tablet Take 50 mg  by mouth 2 (two) times daily.   Yes [provider]  mirabegron ER (MYRBETRIQ) 50 MG TB24 tablet Take 50 mg by mouth daily.    Yes [provider]  nitroGLYCERIN (NITROSTAT) 0.4 MG SL tablet Place 1 tablet (0.4 mg total) under the tongue every 5 (five) minutes x 3 doses as needed for chest pain. 08/16/17  Yes Rinaldo Cloud, MD  Propylene Glycol 0.6 % SOLN Place 2 drops into both eyes daily as needed (for dry eyes).    Yes [provider]  risperiDONE (RISPERDAL) 1 MG tablet Take 1 mg by mouth 2 (two) times daily.   Yes [provider]   aspirin EC 325 MG tablet Take 1 tablet (325 mg total) by mouth 2 (two) times daily after a meal. Take x 1 month post op to decrease risk of blood clots. Patient not taking: No sig reported 12/31/17   Marshia Ly, PA-C  atorvastatin (LIPITOR) 40 MG tablet Take 1 tablet (40 mg total) by mouth daily at 6 PM. Patient not taking: Reported on 10/31/2020 08/17/17   Rinaldo Cloud, MD    Physical Exam: Constitutional: Moderately built and nourished. Vitals:   10/31/20 2300 11/01/20 0008 11/01/20 0010 11/01/20 0045  BP:  (!) 143/64  (!) 147/56  Pulse:  88 89 88  Resp:  (!) 29 (!) 22 (!) 24  Temp: 98.4 F (36.9 C)     TempSrc: Oral     SpO2:  95% 95% 96%   Eyes: Anicteric no pallor. ENMT: No discharge from the ears eyes nose and mouth. Neck: No mass felt.  No neck rigidity. Respiratory: No rhonchi or crepitations. Cardiovascular: S1-S2 heard. Abdomen: Soft nontender bowel sound present. Musculoskeletal: Swelling of the left lower extremity from knee below involving the ankle and foot. Skin: Erythema of the left leg left from knee below. Neurologic: Alert awake oriented to time place and person.  Moves all extremities. Psychiatric: Appears normal.  Normal affect.   Labs on Admission: I have personally reviewed following labs and imaging studies  CBC: Recent Labs  Lab 10/30/20 1426 10/31/20 1846  WBC 20.1* 15.0*  NEUTROABS 17.8* 12.9*  HGB 10.5* 9.7*  HCT 32.5* 29.1*  MCV 105.2* 102.1*  PLT 248 249   Basic Metabolic Panel: Recent Labs  Lab 10/30/20 1426 10/31/20 1846  NA 126* 127*  K 4.3 4.2  CL 94* 95*  CO2 24 22  GLUCOSE 187* 138*  BUN 43* 36*  CREATININE 2.32* 1.71*  CALCIUM 8.3* 8.7*   GFR: CrCl cannot be calculated (Unknown ideal weight.). Liver Function Tests: Recent Labs  Lab 10/30/20 1426  AST 35  ALT 16  ALKPHOS 80  BILITOT 0.9  PROT 6.3*  ALBUMIN 3.3*   No results for input(s): LIPASE, AMYLASE in the last 168 hours. No results for input(s):  AMMONIA in the last 168 hours. Coagulation Profile: No results for input(s): INR, PROTIME in the last 168 hours. Cardiac Enzymes: No results for input(s): CKTOTAL, CKMB, CKMBINDEX, TROPONINI in the last 168 hours. BNP (last 3 results) No results for input(s): PROBNP in the last 8760 hours. HbA1C: No results for input(s): HGBA1C in the last 72 hours. CBG: No results for input(s): GLUCAP in the last 168 hours. Lipid Profile: No results for input(s): CHOL, HDL, LDLCALC, TRIG, CHOLHDL, LDLDIRECT in the last 72 hours. Thyroid Function Tests: No results for input(s): TSH, T4TOTAL, FREET4, T3FREE, THYROIDAB in the last 72 hours. Anemia Panel: No results for input(s): VITAMINB12, FOLATE, FERRITIN, TIBC, IRON, RETICCTPCT  in the last 72 hours. Urine analysis:    Component Value Date/Time   COLORURINE STRAW (A) 12/22/2017 1449   APPEARANCEUR CLEAR 12/22/2017 1449   LABSPEC 1.010 12/22/2017 1449   PHURINE 5.0 12/22/2017 1449   GLUCOSEU NEGATIVE 12/22/2017 1449   HGBUR NEGATIVE 12/22/2017 1449   BILIRUBINUR NEGATIVE 12/22/2017 1449   KETONESUR NEGATIVE 12/22/2017 1449   PROTEINUR NEGATIVE 12/22/2017 1449   UROBILINOGEN 0.2 07/20/2014 0732   NITRITE NEGATIVE 12/22/2017 1449   LEUKOCYTESUR NEGATIVE 12/22/2017 1449   Sepsis Labs: @LABRCNTIP (procalcitonin:4,lacticidven:4) )No results found for this or any previous visit (from the past 240 hour(s)).   Radiological Exams on Admission: DG Chest 1 View  Result Date: 10/31/2020 CLINICAL DATA:  Infection. Patient reports chest pain and shortness of breath EXAM: CHEST  1 VIEW COMPARISON:  01/01/2018 FINDINGS: The cardiomediastinal contours are normal. Atherosclerosis of the aortic arch. Similar hyperinflation to prior exam. Mild central bronchial thickening. Pulmonary vasculature is normal. No consolidation, pleural effusion, or pneumothorax. No acute osseous abnormalities are seen. IMPRESSION: Mild central bronchial thickening and hyperinflation,  may represent bronchitis, asthma, or sequela of smoking. No evidence of pneumonia. Electronically Signed   By: Narda RutherfordMelanie  Sanford M.D.   On: 10/31/2020 19:11   DG Foot 2 Views Left  Result Date: 10/31/2020 CLINICAL DATA:  Left leg pain for 4 days, swelling and erythema EXAM: LEFT FOOT - 2 VIEW COMPARISON:  None. FINDINGS: Frontal and lateral views of the left foot are obtained. There are no acute or destructive bony lesions. The bones are diffusely osteopenic. 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 There is extensive soft tissue edema. No radiopaque foreign bodies or subcutaneous gas. IMPRESSION: 1. Diffuse soft tissue edema. 2. Osteopenia, no acute or destructive bony process. 3. Osteoarthritis of the hindfoot and midfoot. Electronically Signed   By: Sharlet SalinaMichael  Brown M.D.   On: 10/31/2020 21:56   VAS US LOWER EXTREMITY VENOUS (DVT) ([facility] and [facility] 7a-7p)  Result Date: 10/30/2020  Lower Venous DVT Study Patient Name:  Marilyn Sexton  Date of Exam:   10/30/2020 Medical Rec #: [id number]           Accession #:    [id number] Date of Birth: 05-13-1945           Patient Gender: F Patient Age:   [age] years [facility] Procedure:      VAS US LOWER EXTREMITY VENOUS (DVT) Referring Phys: Leonie DouglasJOHANA SOTO --------------------------------------------------------------------------------  Comparison Study: Previous exam 02/20/2017 negative Performing Technologist: Jean Rosenthalachel Hodge RDMS, RVT  Examination Guidelines: A complete evaluation includes B-mode imaging, spectral Doppler, color Doppler, and power Doppler as needed of all accessible portions of each vessel. Bilateral testing is considered an integral part of a complete examination. Limited examinations for reoccurring indications may be performed as noted. The reflux portion of the exam is performed with the patient in reverse Trendelenburg.  +-----+---------------+---------+-----------+----------+--------------+  RIGHTCompressibilityPhasicitySpontaneityPropertiesThrombus Aging +-----+---------------+---------+-----------+----------+--------------+ CFV  Full           Yes      Yes                                 +-----+---------------+---------+-----------+----------+--------------+   +---------+---------------+---------+-----------+----------+---------------+ LEFT     CompressibilityPhasicitySpontaneityPropertiesThrombus Aging  +---------+---------------+---------+-----------+----------+---------------+ CFV      Full           Yes      Yes                                  +---------+---------------+---------+-----------+----------+---------------+  SFJ      Full                                                         +---------+---------------+---------+-----------+----------+---------------+ FV Prox  Full                                                         +---------+---------------+---------+-----------+----------+---------------+ FV Mid   Full           Yes      Yes                                  +---------+---------------+---------+-----------+----------+---------------+ FV DistalFull           Yes      Yes                                  +---------+---------------+---------+-----------+----------+---------------+ PFV      Full                                                         +---------+---------------+---------+-----------+----------+---------------+ POP      Full           Yes      Yes                                  +---------+---------------+---------+-----------+----------+---------------+ PTV                                                   patent by color +---------+---------------+---------+-----------+----------+---------------+ PERO                                                  patent by color +---------+---------------+---------+-----------+----------+---------------+     *See table(s) above for measurements  and observations. Electronically signed by Coral Else MD on 10/30/2020 at 8:10:50 PM.    Final     EKG: Independently reviewed.  Normal sinus rhythm low voltage.  Assessment/Plan Principal Problem:   Cellulitis of left leg Active Problems:   AKI (acute kidney injury) (HCC)   Type 2 diabetes mellitus without complication (HCC)   Anemia   Cellulitis    Left lower extremity cellulitis -patient has a quite extensive cellulitis involving the left leg from knee below and also involves the ankle and foot.  No definite signs of any compartment syndrome.  We will closely monitor for any further worsening given the history of diabetes.  Continue antibiotics follow cultures.  Dopplers were negative for DVT. AKI  when compared to old labs in 2019 patient's creatinine has increased.  But has improved when compared to that done 1 day ago.  We will hold off patient's ACE inhibitor patient is also stopped her Lasix as instructed by her primary care physician.  Did receive fluids in the ER.  Follow metabolic panel. CAD denies any chest pain.  On aspirin and beta-blockers.  Patient does not take statins. Diabetes mellitus type 2 last hemoglobin A1c is not known.  On NovoLog 70/30 on 20 units twice daily.  Sliding scale coverage. Anemia follow CBC appears to be chronic.  Check anemia panel. History of depression we will continue on home medications including Cogentin Risperdal.  COVID test is pending.  Since patient has significant cellulitis with history of diabetes will need close monitoring for any further worsening and inpatient status.   DVT prophylaxis: Heparin. Code Status: Full code. Family Communication: Discussed with patient. Disposition Plan: Home. Consults called: None. Admission status: Inpatient.   Eduard Clos MD [facility] Pager [phone].  If 7PM-7AM, please contact night-coverage [email or web address] Password [facility]  11/01/2020, 12:59 AM

## 2020-11-01 NOTE — Progress Notes (Signed)
Pharmacy Antibiotic Note  Marilyn Sexton is a 75 y.o. female admitted on 10/31/2020 with LLE cellulitis.  Pharmacy has been consulted for Vancomycin and Cefepime  dosing.  Plan: Vancomycin 1750 mg IV now as ordered, then Vancomycin 1000 mg IV q24h Cefepime 2 g IV q12h     Temp (24hrs), Avg:98.8 F (37.1 C), Min:98.4 F (36.9 C), Max:99.2 F (37.3 C)  Recent Labs  Lab 10/30/20 1426 10/31/20 1846 10/31/20 1847 10/31/20 2135  WBC 20.1* 15.0*  --   --   CREATININE 2.32* 1.71*  --   --   LATICACIDVEN  --   --  1.9 1.1    CrCl cannot be calculated (Unknown ideal weight.).    No Known Allergies   Eddie Candle 11/01/2020 1:08 AM

## 2020-11-01 NOTE — Plan of Care (Signed)
Pt admitted for BLE cellulitis, pt on tele NSR, 75 LR  skin clean dry and intact with no break down. BP (!) 145/57 (BP Location: Left Arm)   Pulse 95   Temp 98.8 F (37.1 C) (Oral)   Resp 18   SpO2 96%  Pt oriented to floor, call bell nearby, bed alarm on and bed in lowest position. No needs voiced at this time.  Tomasa Hose Grady Mohabir,RN 11/01/20 2:46 AM

## 2020-11-01 NOTE — Progress Notes (Signed)
Pharmacy Antibiotic Note  Marilyn Sexton is a 75 y.o. female admitted on 10/31/2020 with LLE cellulitis.  Pharmacy has been consulted for Vancomycin and Cefepime dosing. SCr trended down to 1.49 today.  Plan: Cefepime 2g IV q12h Adjust vancomycin to 750mg  IV q24h. Goal AUC 400-550. Expected AUC: 494 SCr used: 1.49 Monitor clinical progress, c/s, renal function F/u de-escalation plan/LOT, vancomycin levels as indicated    Height: 5\' 6"  (167.6 cm) Weight: 99.5 kg (219 lb 5.7 oz) IBW/kg (Calculated) : 59.3  Temp (24hrs), Avg:99.1 F (37.3 C), Min:98.4 F (36.9 C), Max:100 F (37.8 C)  Recent Labs  Lab 10/30/20 1426 10/31/20 1846 10/31/20 1847 10/31/20 2135 11/01/20 0253  WBC 20.1* 15.0*  --   --  12.6*  CREATININE 2.32* 1.71*  --   --  1.49*  LATICACIDVEN  --   --  1.9 1.1  --      Estimated Creatinine Clearance: 40 mL/min (A) (by C-G formula based on SCr of 1.49 mg/dL (H)).    No Known Allergies   [id number], PharmD, BCPS Please check AMION for all [facility] contact numbers Clinical Pharmacist 11/01/2020 3:09 PM

## 2020-11-01 NOTE — ED Notes (Signed)
Per pharmacy zosyn and LR can run together

## 2020-11-01 NOTE — Progress Notes (Signed)
PROGRESS NOTE    Marilyn Sexton  XBD:[id number] DOB: 1945-07-08 DOA: 10/31/2020 PCP: Adrian Prince, MD   Brief Narrative: 75 year old with past medical history significant for diabetes type 2, hypertension, CAD presented with lower extremity edema and erythema left lower extremity and ankle, for 4 days prior to admission.  Dopplers was negative for DVT.  She is admitted with cellulitis white count of 15,000, AKI with a creatinine up to 2.3.  Hyponatremia.   Assessment & Plan:   Principal Problem:   Cellulitis of left leg Active Problems:   AKI (acute kidney injury) (HCC)   Type 2 diabetes mellitus without complication (HCC)   Anemia   Cellulitis  1-Left LE Cellulitis;  Would continue with vancomycin and cefepime  X ray; will get ankle x ray  Follow Blood culture.  WBC trending down.  Still with significant edema and redness.   AKI; improved with IV fluids. Cr peak to 2.   Hyponatremia; continue with IV fluid.   CAD Continue with aspirin and BB  Diabetes; Continue with Novolog 70/30  Anemia; iron deficiency and B 12 deficiency.  B 12: 104.  She will need iron supplement when infection is controlled.  Start B 12 IM  Probably COPD; Had wheezing this am/.  Started on nebulizer which help.   Depression; Continue with risperidal.     Estimated body mass index is 35.41 kg/m as calculated from the following:   Height as of this encounter: 5\' 6"  (1.676 m).   Weight as of this encounter: 99.5 kg.   DVT prophylaxis: heparin  Code Status: Full Code Family Communication: care discussed with patient Disposition Plan:  Status is: Inpatient  Remains inpatient appropriate because:IV treatments appropriate due to intensity of illness or inability to take PO  Dispo: The patient is from: Home              Anticipated d/c is to: Home              Patient currently is not medically stable to d/c.   Difficult to place patient No        Consultants:   none  Procedures:  none  Antimicrobials:    Subjective: She report she is breathing better., she had some SOB this am.  She report left leg edema and redness same  Objective: Vitals:   11/01/20 0812 11/01/20 1004 11/01/20 1120 11/01/20 1431  BP: (!) 137/44  (!) 125/49   Pulse: 92  81   Resp: 17  20   Temp: 100 F (37.8 C)  98.9 F (37.2 C)   TempSrc:      SpO2: 91% 92% 97%   Weight:    99.5 kg  Height:    5\' 6"  (1.676 m)    Intake/Output Summary (Last 24 hours) at 11/01/2020 1617 Last data filed at 11/01/2020 1500 Gross per 24 hour  Intake 350 ml  Output 1000 ml  Net -650 ml   Filed Weights   11/01/20 1431  Weight: 99.5 kg    Examination:  General exam: Appears calm and comfortable  Respiratory system: Clear to auscultation. Respiratory effort normal. Cardiovascular system: S1 & S2 heard, RRR. No JVD, murmurs, rubs, gallops or clicks. No pedal edema. Gastrointestinal system: Abdomen is nondistended, soft and nontender. No organomegaly or masses felt. Normal bowel sounds heard. Central nervous system: Alert and oriented. No focal neurological deficits. Extremities: left leg edema redness,    Data Reviewed: I have personally reviewed following labs and imaging studies  CBC: Recent Labs  Lab 10/30/20 1426 10/31/20 1846 11/01/20 0253  WBC 20.1* 15.0* 12.6*  NEUTROABS 17.8* 12.9*  --   HGB 10.5* 9.7* 9.6*  HCT 32.5* 29.1* 28.2*  MCV 105.2* 102.1* 100.0  PLT 248 249 248   Basic Metabolic Panel: Recent Labs  Lab 10/30/20 1426 10/31/20 1846 11/01/20 0253  NA 126* 127* 129*  K 4.3 4.2 4.0  CL 94* 95* 97*  CO2 GLUCOSE 187* 138* 151*  BUN 43* 36* 32*  CREATININE 2.32* 1.71* 1.49*  CALCIUM 8.3* 8.7* 8.6*   GFR: Estimated Creatinine Clearance: 40 mL/min (A) (by C-G formula based on SCr of 1.49 mg/dL (H)). Liver Function Tests: Recent Labs  Lab 10/30/20 1426 11/01/20 0253  AST 35 20  ALT 16 15  ALKPHOS 80 74  BILITOT 0.9 1.0  PROT  6.3* 5.7*  ALBUMIN 3.3* 3.0*   No results for input(s): LIPASE, AMYLASE in the last 168 hours. No results for input(s): AMMONIA in the last 168 hours. Coagulation Profile: No results for input(s): INR, PROTIME in the last 168 hours. Cardiac Enzymes: No results for input(s): CKTOTAL, CKMB, CKMBINDEX, TROPONINI in the last 168 hours. BNP (last 3 results) No results for input(s): PROBNP in the last 8760 hours. HbA1C: Recent Labs    11/01/20 0253  HGBA1C 6.4*   CBG: Recent Labs  Lab 11/01/20 0358 11/01/20 0813 11/01/20 1120 11/01/20 1538  GLUCAP 185* 127* 186* 179*   Lipid Profile: No results for input(s): CHOL, HDL, LDLCALC, TRIG, CHOLHDL, LDLDIRECT in the last 72 hours. Thyroid Function Tests: No results for input(s): TSH, T4TOTAL, FREET4, T3FREE, THYROIDAB in the last 72 hours. Anemia Panel: Recent Labs    11/01/20 0253  VITAMINB12 104*  FOLATE 15.9  FERRITIN 215  TIBC 228*  IRON 15*  RETICCTPCT 1.4   Sepsis Labs: Recent Labs  Lab 10/31/20 1847 10/31/20 2135  LATICACIDVEN 1.9 1.1    Recent Results (from the past 240 hour(s))  SARS CORONAVIRUS 2 (TAT 6-24 HRS) Nasopharyngeal Nasopharyngeal Swab     Status: None   Collection Time: 10/31/20  6:34 PM   Specimen: Nasopharyngeal Swab  Result Value Ref Range Status   SARS Coronavirus 2 NEGATIVE NEGATIVE Final    Comment: (NOTE) SARS-CoV-2 target nucleic acids are NOT DETECTED.  The SARS-CoV-2 RNA is generally detectable in upper and lower respiratory specimens during the acute phase of infection. Negative results do not preclude SARS-CoV-2 infection, do not rule out co-infections with other pathogens, and should not be used as the sole basis for treatment or other patient management decisions. Negative results must be combined with clinical observations, patient history, and epidemiological information. The expected result is Negative.  Fact Sheet for  Patients: HairSlick.no  Fact Sheet for Healthcare Providers: quierodirigir.com  This test is not yet approved or cleared by the [place] FDA and  has been authorized for detection and/or diagnosis of SARS-CoV-2 by FDA under an Emergency Use Authorization (EUA). This EUA will remain  in effect (meaning this test can be used) for the duration of the COVID-19 declaration under Se ction 564(b)(1) of the Act, 21 U.S.C. section 360bbb-3(b)(1), unless the authorization is terminated or revoked sooner.  Performed at [facility], [address] [place], [place] [address]   Blood culture (routine x 2)     Status: None (Preliminary result)   Collection Time: 10/31/20  6:47 PM   Specimen: BLOOD  Result Value Ref Range Status   Specimen Description BLOOD  SITE NOT SPECIFIED  Final   Special Requests   Final    BOTTLES DRAWN AEROBIC AND ANAEROBIC Blood Culture results may not be optimal due to an inadequate volume of blood received in culture bottles   Culture   Final    NO GROWTH < 12 HOURS Performed at [facility], [address] [place], [place] [address]    Report Status PENDING  Incomplete  Blood culture (routine x 2)     Status: None (Preliminary result)   Collection Time: 10/31/20  9:35 PM   Specimen: BLOOD RIGHT HAND  Result Value Ref Range Status   Specimen Description BLOOD RIGHT HAND  Final   Special Requests   Final    BOTTLES DRAWN AEROBIC AND ANAEROBIC Blood Culture results may not be optimal due to an inadequate volume of blood received in culture bottles   Culture   Final    NO GROWTH < 12 HOURS Performed at [facility], [address] [place], [place] [address]    Report Status PENDING  Incomplete         Radiology Studies: DG Chest 1 View  Result Date: 10/31/2020 CLINICAL DATA:  Infection. Patient reports chest pain and shortness of breath EXAM: CHEST  1 VIEW COMPARISON:  01/01/2018  FINDINGS: The cardiomediastinal contours are normal. Atherosclerosis of the aortic arch. Similar hyperinflation to prior exam. Mild central bronchial thickening. Pulmonary vasculature is normal. No consolidation, pleural effusion, or pneumothorax. No acute osseous abnormalities are seen. IMPRESSION: Mild central bronchial thickening and hyperinflation, may represent bronchitis, asthma, or sequela of smoking. No evidence of pneumonia. Electronically Signed   By: Narda RutherfordMelanie  Sanford M.D.   On: 10/31/2020 19:11   DG Foot 2 Views Left  Result Date: 10/31/2020 CLINICAL DATA:  Left leg pain for 4 days, swelling and erythema EXAM: LEFT FOOT - 2 VIEW COMPARISON:  None. FINDINGS: Frontal and lateral views of the left foot are obtained. There are no acute or destructive bony lesions. The bones are diffusely osteopenic. 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 There is extensive soft tissue edema. No radiopaque foreign bodies or subcutaneous gas. IMPRESSION: 1. Diffuse soft tissue edema. 2. Osteopenia, no acute or destructive bony process. 3. Osteoarthritis of the hindfoot and midfoot. Electronically Signed   By: Sharlet SalinaMichael  Brown M.D.   On: 10/31/2020 21:56   VAS US LOWER EXTREMITY VENOUS (DVT) ([facility] and [facility] 7a-7p)  Result Date: 10/30/2020  Lower Venous DVT Study Patient Name:  Baron SaneBONNIE F Flenniken  Date of Exam:   10/30/2020 Medical Rec #: [id number]           Accession #:    [id number] Date of Birth: 13-Nov-1945           Patient Gender: F Patient Age:   [age] years [facility]am [facility] Procedure:      VAS US LOWER EXTREMITY VENOUS (DVT) Referring Phys: Leonie DouglasJOHANA SOTO --------------------------------------------------------------------------------  Comparison Study: Previous exam 02/20/2017 negative Performing Technologist: Jean Rosenthalachel Hodge RDMS, RVT  Examination Guidelines: A complete evaluation includes B-mode imaging, spectral Doppler, color Doppler, and power Doppler as needed of all accessible portions  of each vessel. Bilateral testing is considered an integral part of a complete examination. Limited examinations for reoccurring indications may be performed as noted. The reflux portion of the exam is performed with the patient in reverse Trendelenburg.  +-----+---------------+---------+-----------+----------+--------------+ RIGHTCompressibilityPhasicitySpontaneityPropertiesThrombus Aging +-----+---------------+---------+-----------+----------+--------------+ CFV  Full           Yes  Yes                                 +-----+---------------+---------+-----------+----------+--------------+   +---------+---------------+---------+-----------+----------+---------------+ LEFT     CompressibilityPhasicitySpontaneityPropertiesThrombus Aging  +---------+---------------+---------+-----------+----------+---------------+ CFV      Full           Yes      Yes                                  +---------+---------------+---------+-----------+----------+---------------+ SFJ      Full                                                         +---------+---------------+---------+-----------+----------+---------------+ FV Prox  Full                                                         +---------+---------------+---------+-----------+----------+---------------+ FV Mid   Full           Yes      Yes                                  +---------+---------------+---------+-----------+----------+---------------+ FV DistalFull           Yes      Yes                                  +---------+---------------+---------+-----------+----------+---------------+ PFV      Full                                                         +---------+---------------+---------+-----------+----------+---------------+ POP      Full           Yes      Yes                                  +---------+---------------+---------+-----------+----------+---------------+ PTV                                                    patent by color +---------+---------------+---------+-----------+----------+---------------+ PERO                                                  patent by color +---------+---------------+---------+-----------+----------+---------------+     *See table(s) above for measurements and observations. Electronically signed by Coral Else MD on 10/30/2020 at 8:10:50 PM.    Final  Scheduled Meds:  amLODipine  5 mg Oral Daily   aspirin EC  81 mg Oral Daily   benztropine  0.5 mg Oral BID   docusate sodium  100 mg Oral BID   doxepin  3 mg Oral QHS   heparin  5,000 Units Subcutaneous Q8H   insulin aspart  0-9 Units Subcutaneous TID WC   insulin aspart protamine- aspart  20 Units Subcutaneous BID WC   ipratropium-albuterol  3 mL Nebulization BID   metoprolol tartrate  50 mg Oral BID   mirabegron ER  50 mg Oral Daily   risperiDONE  1 mg Oral BID   Continuous Infusions:  ceFEPime (MAXIPIME) IV     [START ON 11/02/2020] vancomycin       LOS: 1 day    Time spent:35 minutes    Gearline Spilman A Annaleise Burger, MD [facility]   If 7PM-7AM, please contact night-coverage [email or web address]  11/01/2020, 4:17 PM

## 2020-11-02 LAB — CBC
HCT: 26.6 % — ABNORMAL LOW (ref 36.0–46.0)
Hemoglobin: 8.6 g/dL — ABNORMAL LOW (ref 12.0–15.0)
MCH: 33.5 pg (ref 26.0–34.0)
MCHC: 32.3 g/dL (ref 30.0–36.0)
MCV: 103.5 fL — ABNORMAL HIGH (ref 80.0–100.0)
Platelets: 254 10*3/uL (ref 150–400)
RBC: 2.57 MIL/uL — ABNORMAL LOW (ref 3.87–5.11)
RDW: 15.9 % — ABNORMAL HIGH (ref 11.5–15.5)
WBC: 13 10*3/uL — ABNORMAL HIGH (ref 4.0–10.5)
nRBC: 0 % (ref 0.0–0.2)

## 2020-11-02 LAB — GLUCOSE, CAPILLARY
Glucose-Capillary: 195 mg/dL — ABNORMAL HIGH (ref 70–99)
Glucose-Capillary: 134 mg/dL — ABNORMAL HIGH (ref 70–99)
Glucose-Capillary: 218 mg/dL — ABNORMAL HIGH (ref 70–99)
Glucose-Capillary: 104 mg/dL — ABNORMAL HIGH (ref 70–99)

## 2020-11-02 LAB — BASIC METABOLIC PANEL
Anion gap: 8 (ref 5–15)
BUN: 19 mg/dL (ref 8–23)
CO2: 22 mmol/L (ref 22–32)
Calcium: 8.5 mg/dL — ABNORMAL LOW (ref 8.9–10.3)
Chloride: 102 mmol/L (ref 98–111)
Creatinine, Ser: 1.41 mg/dL — ABNORMAL HIGH (ref 0.44–1.00)
GFR, Estimated: 39 mL/min — ABNORMAL LOW (ref >60)
Glucose, Bld: 146 mg/dL — ABNORMAL HIGH (ref 70–99)
Potassium: 4.1 mmol/L (ref 3.5–5.1)
Sodium: 132 mmol/L — ABNORMAL LOW (ref 135–145)

## 2020-11-02 MED ORDER — FUROSEMIDE 10 MG/ML IJ SOLN: 20.0000 mg | Freq: Once | INTRAMUSCULAR | Status: DC

## 2020-11-02 NOTE — Progress Notes (Signed)
PROGRESS NOTE    Marilyn Sexton  AYT:[id number] DOB: 1945-06-25 DOA: 10/31/2020 PCP: Adrian Prince, MD   Brief Narrative: 75 year old with past medical history significant for diabetes type 2, hypertension, CAD presented with lower extremity edema and erythema left lower extremity and ankle, for 4 days prior to admission.  Dopplers was negative for DVT.  She is admitted with cellulitis white count of 15,000, AKI with a creatinine up to 2.3.  Hyponatremia.   Assessment & Plan:   Principal Problem:   Cellulitis of left leg Active Problems:   AKI (acute kidney injury) (HCC)   Type 2 diabetes mellitus without complication (HCC)   Anemia   Cellulitis  1-Left LE Cellulitis;  Would continue with vancomycin and cefepime day 2.  X ray; edema no joint effusion.  Follow Blood culture. No growth to date.  WBC at 13 from 12.  Still with significant edema and redness. Continue with IV antibiotics.  Will check ABI and ANCA and ANA panel.  Will give low dose lasix to help with fluid.   AKI; improved with IV fluids. Cr peak to 2.  NLS fluids.   Hyponatremia; Improved with Fluids.   CAD Continue with aspirin and BB  Diabetes; Continue with Novolog 70/30  Anemia; iron deficiency and B 12 deficiency.  B 12: 104.  She will need iron supplement when infection is controlled.  Started  B 12 IM  Probably COPD; Had wheezing this am/.  Started on nebulizer which help.   Depression; Continue with risperidal.     Estimated body mass index is 36.26 kg/m as calculated from the following:   Height as of this encounter: 5\' 6"  (1.676 m).   Weight as of this encounter: 101.9 kg.   DVT prophylaxis: heparin  Code Status: Full Code Family Communication: care discussed with patient and Husband who was at bedside.  Disposition Plan:  Status is: Inpatient  Remains inpatient appropriate because:IV treatments appropriate due to intensity of illness or inability to take PO  Dispo: The  patient is from: Home              Anticipated d/c is to: Home              Patient currently is not medically stable to d/c.   Difficult to place patient No        Consultants:  none  Procedures:  none  Antimicrobials:    Subjective: She is breathing ok. Still with leg pain.    Objective: Vitals:   11/02/20 0500 11/02/20 0754 11/02/20 0837 11/02/20 1332  BP:  (!) 141/51  (!) 125/49  Pulse:  89 93 87  Resp:  17 18   Temp:  98.2 F (36.8 C)  98.1 F (36.7 C)  TempSrc:  Oral  Oral  SpO2:  95% 95% 93%  Weight: 101.9 kg     Height:        Intake/Output Summary (Last 24 hours) at 11/02/2020 1338 Last data filed at 11/02/2020 1141 Gross per 24 hour  Intake 450.15 ml  Output 2500 ml  Net -2049.85 ml    Filed Weights   11/01/20 1431 11/02/20 0500  Weight: 99.5 kg 101.9 kg    Examination:  General exam: NAD Respiratory system: CTA Cardiovascular system: S 1, S 2 RRR Gastrointestinal system: BS present, soft, nt Central nervous system: Alert, oriented.  Extremities: Left Leg with redness, edema not better   Data Reviewed: I have personally reviewed following labs and imaging studies  CBC:  Recent Labs  Lab 10/30/20 1426 10/31/20 1846 11/01/20 0253 11/02/20 1039  WBC 20.1* 15.0* 12.6* 13.0*  NEUTROABS 17.8* 12.9*  --   --   HGB 10.5* 9.7* 9.6* 8.6*  HCT 32.5* 29.1* 28.2* 26.6*  MCV 105.2* 102.1* 100.0 103.5*  PLT 248 249 248 254    Basic Metabolic Panel: Recent Labs  Lab 10/30/20 1426 10/31/20 1846 11/01/20 0253 11/02/20 1039  NA 126* 127* 129* 132*  K 4.3 4.2 4.0 4.1  CL 94* 95* 97* 102  CO2 24 22 23 22   GLUCOSE 187* 138* 151* 146*  BUN 43* 36* 32* 19  CREATININE 2.32* 1.71* 1.49* 1.41*  CALCIUM 8.3* 8.7* 8.6* 8.5*    GFR: Estimated Creatinine Clearance: 42.8 mL/min (A) (by C-G formula based on SCr of 1.41 mg/dL (H)). Liver Function Tests: Recent Labs  Lab 10/30/20 1426 11/01/20 0253  AST 35 20  ALT 16 15  ALKPHOS 80 74   BILITOT 0.9 1.0  PROT 6.3* 5.7*  ALBUMIN 3.3* 3.0*    No results for input(s): LIPASE, AMYLASE in the last 168 hours. No results for input(s): AMMONIA in the last 168 hours. Coagulation Profile: No results for input(s): INR, PROTIME in the last 168 hours. Cardiac Enzymes: No results for input(s): CKTOTAL, CKMB, CKMBINDEX, TROPONINI in the last 168 hours. BNP (last 3 results) No results for input(s): PROBNP in the last 8760 hours. HbA1C: Recent Labs    11/01/20 0253  HGBA1C 6.4*    CBG: Recent Labs  Lab 11/01/20 1120 11/01/20 1538 11/01/20 2222 11/02/20 0818 11/02/20 1141  GLUCAP 186* 179* 107* 195* 134*    Lipid Profile: No results for input(s): CHOL, HDL, LDLCALC, TRIG, CHOLHDL, LDLDIRECT in the last 72 hours. Thyroid Function Tests: No results for input(s): TSH, T4TOTAL, FREET4, T3FREE, THYROIDAB in the last 72 hours. Anemia Panel: Recent Labs    11/01/20 0253  VITAMINB12 104*  FOLATE 15.9  FERRITIN 215  TIBC 228*  IRON 15*  RETICCTPCT 1.4    Sepsis Labs: Recent Labs  Lab 10/31/20 1847 10/31/20 2135  LATICACIDVEN 1.9 1.1     Recent Results (from the past 240 hour(s))  SARS CORONAVIRUS 2 (TAT 6-24 HRS) Nasopharyngeal Nasopharyngeal Swab     Status: None   Collection Time: 10/31/20  6:34 PM   Specimen: Nasopharyngeal Swab  Result Value Ref Range Status   SARS Coronavirus 2 NEGATIVE NEGATIVE Final    Comment: (NOTE) SARS-CoV-2 target nucleic acids are NOT DETECTED.  The SARS-CoV-2 RNA is generally detectable in upper and lower respiratory specimens during the acute phase of infection. Negative results do not preclude SARS-CoV-2 infection, do not rule out co-infections with other pathogens, and should not be used as the sole basis for treatment or other patient management decisions. Negative results must be combined with clinical observations, patient history, and epidemiological information. The expected result is Negative.  Fact Sheet for  Patients: HairSlick.nohttps://www.fda.gov/media/138098/download  Fact Sheet for Healthcare Providers: quierodirigir.comhttps://www.fda.gov/media/138095/download  This test is not yet approved or cleared by the [place] FDA and  has been authorized for detection and/or diagnosis of SARS-CoV-2 by FDA under an Emergency Use Authorization (EUA). This EUA will remain  in effect (meaning this test can be used) for the duration of the COVID-19 declaration under Se ction 564(b)(1) of the Act, 21 U.S.C. section 360bbb-3(b)(1), unless the authorization is terminated or revoked sooner.  Performed at [facility], [address] [place], [place] [address]   Blood culture (routine x 2)  Status: None (Preliminary result)   Collection Time: 10/31/20  6:47 PM   Specimen: BLOOD  Result Value Ref Range Status   Specimen Description BLOOD SITE NOT SPECIFIED  Final   Special Requests   Final    BOTTLES DRAWN AEROBIC AND ANAEROBIC Blood Culture results may not be optimal due to an inadequate volume of blood received in culture bottles   Culture   Final    NO GROWTH 2 DAYS Performed at [facility], [address], [place], [place] [address]    Report Status PENDING  Incomplete  Blood culture (routine x 2)     Status: None (Preliminary result)   Collection Time: 10/31/20  9:35 PM   Specimen: BLOOD RIGHT HAND  Result Value Ref Range Status   Specimen Description BLOOD RIGHT HAND  Final   Special Requests   Final    BOTTLES DRAWN AEROBIC AND ANAEROBIC Blood Culture results may not be optimal due to an inadequate volume of blood received in culture bottles   Culture   Final    NO GROWTH 2 DAYS Performed at [facility], [address], [place], [place] [address]    Report Status PENDING  Incomplete          Radiology Studies: DG Chest 1 View  Result Date: 10/31/2020 CLINICAL DATA:  Infection. Patient reports chest pain and shortness of breath EXAM: CHEST  1 VIEW COMPARISON:  01/01/2018  FINDINGS: The cardiomediastinal contours are normal. Atherosclerosis of the aortic arch. Similar hyperinflation to prior exam. Mild central bronchial thickening. Pulmonary vasculature is normal. No consolidation, pleural effusion, or pneumothorax. No acute osseous abnormalities are seen. IMPRESSION: Mild central bronchial thickening and hyperinflation, may represent bronchitis, asthma, or sequela of smoking. No evidence of pneumonia. Electronically Signed   By: Narda Rutherford M.D.   On: 10/31/2020 19:11   DG Ankle 2 Views Left  Result Date: 11/01/2020 CLINICAL DATA:  Edema EXAM: LEFT ANKLE - 2 VIEW COMPARISON:  08/26/2003 FINDINGS: Frontal and lateral views of the left ankle are obtained. There are no acute displaced fractures. There is significant osteoarthritis involving the talocalcaneal joint. Mild joint space narrowing of the tibiotalar joint. Diffuse soft tissue edema. IMPRESSION: 1. Prominent hindfoot osteoarthritis. 2. Diffuse soft tissue edema. Electronically Signed   By: Sharlet Salina M.D.   On: 11/01/2020 18:54   DG Foot 2 Views Left  Result Date: 10/31/2020 CLINICAL DATA:  Left leg pain for 4 days, swelling and erythema EXAM: LEFT FOOT - 2 VIEW COMPARISON:  None. FINDINGS: Frontal and lateral views of the left foot are obtained. There are no acute or destructive bony lesions. The bones are diffusely osteopenic. 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 There is extensive soft tissue edema. No radiopaque foreign bodies or subcutaneous gas. IMPRESSION: 1. Diffuse soft tissue edema. 2. Osteopenia, no acute or destructive bony process. 3. Osteoarthritis of the hindfoot and midfoot. Electronically Signed   By: Sharlet Salina M.D.   On: 10/31/2020 21:56        Scheduled Meds:  amLODipine  5 mg Oral Daily   aspirin EC  81 mg Oral Daily   benztropine  0.5 mg Oral BID   cyanocobalamin  1,000 mcg Intramuscular Daily   docusate sodium  100 mg Oral BID   doxepin  3 mg Oral  QHS   heparin  5,000 Units Subcutaneous Q8H   insulin aspart  0-9 Units Subcutaneous TID WC   insulin aspart protamine- aspart  20 Units  Subcutaneous BID WC   ipratropium-albuterol  3 mL Nebulization BID   metoprolol tartrate  50 mg Oral BID   mirabegron ER  50 mg Oral Daily   risperiDONE  1 mg Oral BID   Continuous Infusions:  ceFEPime (MAXIPIME) IV 2 g (11/02/20 0846)   vancomycin Stopped (11/02/20 0539)     LOS: 2 days    Time spent:35 minutes    Voshon Petro A Naseem Varden, MD [facility]   If 7PM-7AM, please contact night-coverage [email or web address]  11/02/2020, 1:38 PM

## 2020-11-03 ENCOUNTER — Encounter (HOSPITAL_COMMUNITY): Payer: Self-pay | Admitting: Anesthesiology

## 2020-11-03 ENCOUNTER — Inpatient Hospital Stay (HOSPITAL_COMMUNITY): Payer: Medicare Other

## 2020-11-03 DIAGNOSIS — L03116 Cellulitis of left lower limb: Principal | ICD-10-CM

## 2020-11-03 DIAGNOSIS — L0390 Cellulitis, unspecified: Secondary | ICD-10-CM

## 2020-11-03 DIAGNOSIS — I739 Peripheral vascular disease, unspecified: Secondary | ICD-10-CM

## 2020-11-03 LAB — CBC
HCT: 26.6 % — ABNORMAL LOW (ref 36.0–46.0)
Hemoglobin: 8.7 g/dL — ABNORMAL LOW (ref 12.0–15.0)
MCH: 33.6 pg (ref 26.0–34.0)
MCHC: 32.7 g/dL (ref 30.0–36.0)
MCV: 102.7 fL — ABNORMAL HIGH (ref 80.0–100.0)
Platelets: 291 10*3/uL (ref 150–400)
RBC: 2.59 MIL/uL — ABNORMAL LOW (ref 3.87–5.11)
RDW: 15.9 % — ABNORMAL HIGH (ref 11.5–15.5)
WBC: 12.8 10*3/uL — ABNORMAL HIGH (ref 4.0–10.5)
nRBC: 0 % (ref 0.0–0.2)

## 2020-11-03 LAB — BASIC METABOLIC PANEL
Anion gap: 8 (ref 5–15)
BUN: 18 mg/dL (ref 8–23)
CO2: 22 mmol/L (ref 22–32)
Calcium: 8.7 mg/dL — ABNORMAL LOW (ref 8.9–10.3)
Chloride: 102 mmol/L (ref 98–111)
Creatinine, Ser: 1.21 mg/dL — ABNORMAL HIGH (ref 0.44–1.00)
GFR, Estimated: 47 mL/min — ABNORMAL LOW (ref >60)
Glucose, Bld: 139 mg/dL — ABNORMAL HIGH (ref 70–99)
Potassium: 4.5 mmol/L (ref 3.5–5.1)
Sodium: 132 mmol/L — ABNORMAL LOW (ref 135–145)

## 2020-11-03 LAB — GLUCOSE, CAPILLARY
Glucose-Capillary: 155 mg/dL — ABNORMAL HIGH (ref 70–99)
Glucose-Capillary: 198 mg/dL — ABNORMAL HIGH (ref 70–99)
Glucose-Capillary: 150 mg/dL — ABNORMAL HIGH (ref 70–99)
Glucose-Capillary: 141 mg/dL — ABNORMAL HIGH (ref 70–99)

## 2020-11-03 MED ORDER — CEFAZOLIN SODIUM-DEXTROSE 2-4 GM/100ML-% IV SOLN
2.0000 g | Freq: Three times a day (TID) | INTRAVENOUS | Status: DC
  Administered 2020-11-03 – 2020-11-05 (×6): 2 g via INTRAVENOUS
  Filled 2020-11-03 (×7): qty 100

## 2020-11-03 MED ORDER — FUROSEMIDE 10 MG/ML IJ SOLN
40.0000 mg | Freq: Once | INTRAMUSCULAR | Status: AC
  Administered 2020-11-03: 40 mg via INTRAVENOUS
  Filled 2020-11-03: qty 4

## 2020-11-03 NOTE — Progress Notes (Signed)
PROGRESS NOTE    Marilyn Sexton  ZOX:[id number] DOB: Jul 30, 1945 DOA: 10/31/2020 PCP: Adrian Prince, MD   Brief Narrative: 75 year old with past medical history significant for diabetes type 2, hypertension, CAD presented with lower extremity edema and erythema left lower extremity and ankle, for 4 days prior to admission.  Dopplers was negative for DVT.  She is admitted with cellulitis white count of 15,000, AKI with a creatinine up to 2.3.  Hyponatremia.   Assessment & Plan:   Principal Problem:   Cellulitis of left leg Active Problems:   AKI (acute kidney injury) (HCC)   Type 2 diabetes mellitus without complication (HCC)   Anemia   Cellulitis  1-Left LE Cellulitis;  On vancomycin and cefepime day 3.  X ray; edema no joint effusion.  Follow Blood culture. No growth to date.  WBC at 13 from 12.  ABI ; mild disease left, vascular will see, but likely no intervention needed.  ANCA and ANA panel pending.  No improvement of edema or redness, will ask ID to see in consultation.   AKI; improved with IV fluids. Cr peak to 2.  NLS fluids.   Hyponatremia; Improved with Fluids.   CAD Continue with aspirin and BB  Diabetes; Continue with Novolog 70/30  Anemia; iron deficiency and B 12 deficiency.  B 12: 104.  She will need iron supplement when infection is controlled.  Started  B 12 IM  Probably COPD; Had wheezing this am/.  Started on nebulizer which help.   Depression; Continue with risperidal.     Estimated body mass index is 36.69 kg/m as calculated from the following:   Height as of this encounter:  (1.676 m).   Weight as of this encounter: 103.1 kg.   DVT prophylaxis: heparin  Code Status: Full Code Family Communication: care discussed with patient and Husband who was at bedside.  Disposition Plan:  Status is: Inpatient  Remains inpatient appropriate because:IV treatments appropriate due to intensity of illness or inability to take PO  Dispo:  The patient is from: Home              Anticipated d/c is to: Home              Patient currently is not medically stable to d/c.   Difficult to place patient No        Consultants:  none  Procedures:  none  Antimicrobials:    Subjective: She is breathing ok,.  Denies pain in her left foot today. Swelling is worse, also redness is worse in the foot.    Objective: Vitals:   11/02/20 2100 11/03/20 0500 11/03/20 0754 11/03/20 0810  BP: 122/60  (!) 141/48   Pulse:   92   Resp: (!) 22  17   Temp: 98.5 F (36.9 C)  98.5 F (36.9 C)   TempSrc: Oral  Oral   SpO2: 96%  93% 95%  Weight:  103.1 kg    Height:        Intake/Output Summary (Last 24 hours) at 11/03/2020 1334 Last data filed at 11/03/2020 8119 Gross per 24 hour  Intake 296.78 ml  Output 650 ml  Net -353.22 ml    Filed Weights   11/01/20 1431 11/02/20 0500 11/03/20 0500  Weight: 99.5 kg 101.9 kg 103.1 kg    Examination:  General exam: NAD Respiratory system: CTA Cardiovascular system: S 1, S 2 RRR Gastrointestinal system: BS present , soft, nt Central nervous system: alert Extremities: left LE with  edema worse in the foot and ankle. Redness worse in the foot, she has purpuric rash ower part foot.   Data Reviewed: I have personally reviewed following labs and imaging studies  CBC: Recent Labs  Lab 10/30/20 1426 10/31/20 1846 11/01/20 0253 11/02/20 1039 11/03/20 0747  WBC 20.1* 15.0* 12.6* 13.0* 12.8*  NEUTROABS 17.8* 12.9*  --   --   --   HGB 10.5* 9.7* 9.6* 8.6* 8.7*  HCT 32.5* 29.1* 28.2* 26.6* 26.6*  MCV 105.2* 102.1* 100.0 103.5* 102.7*  PLT 248 249 248 254 291    Basic Metabolic Panel: Recent Labs  Lab 10/30/20 1426 10/31/20 1846 11/01/20 0253 11/02/20 1039 11/03/20 0747  NA 126* 127* 129* 132* 132*  K 4.3 4.2 4.0 4.1 4.5  CL 94* 95* 97* 102 102  CO2 24 22 23 22 22   GLUCOSE 187* 138* 151* 146* 139*  BUN 43* 36* 32* 19 18  CREATININE 2.32* 1.71* 1.49* 1.41* 1.21*  CALCIUM  8.3* 8.7* 8.6* 8.5* 8.7*    GFR: Estimated Creatinine Clearance: 50.2 mL/min (A) (by C-G formula based on SCr of 1.21 mg/dL (H)). Liver Function Tests: Recent Labs  Lab 10/30/20 1426 11/01/20 0253  AST 35 20  ALT 16 15  ALKPHOS 80 74  BILITOT 0.9 1.0  PROT 6.3* 5.7*  ALBUMIN 3.3* 3.0*    No results for input(s): LIPASE, AMYLASE in the last 168 hours. No results for input(s): AMMONIA in the last 168 hours. Coagulation Profile: No results for input(s): INR, PROTIME in the last 168 hours. Cardiac Enzymes: No results for input(s): CKTOTAL, CKMB, CKMBINDEX, TROPONINI in the last 168 hours. BNP (last 3 results) No results for input(s): PROBNP in the last 8760 hours. HbA1C: Recent Labs    11/01/20 0253  HGBA1C 6.4*    CBG: Recent Labs  Lab 11/02/20 1141 11/02/20 1633 11/02/20 2153 11/03/20 0610 11/03/20 1137  GLUCAP 134* 218* 104* 155* 198*    Lipid Profile: No results for input(s): CHOL, HDL, LDLCALC, TRIG, CHOLHDL, LDLDIRECT in the last 72 hours. Thyroid Function Tests: No results for input(s): TSH, T4TOTAL, FREET4, T3FREE, THYROIDAB in the last 72 hours. Anemia Panel: Recent Labs    11/01/20 0253  VITAMINB12 104*  FOLATE 15.9  FERRITIN 215  TIBC 228*  IRON 15*  RETICCTPCT 1.4    Sepsis Labs: Recent Labs  Lab 10/31/20 1847 10/31/20 2135  LATICACIDVEN 1.9 1.1     Recent Results (from the past 240 hour(s))  SARS CORONAVIRUS 2 (TAT 6-24 HRS) Nasopharyngeal Nasopharyngeal Swab     Status: None   Collection Time: 10/31/20  6:34 PM   Specimen: Nasopharyngeal Swab  Result Value Ref Range Status   SARS Coronavirus 2 NEGATIVE NEGATIVE Final    Comment: (NOTE) SARS-CoV-2 target nucleic acids are NOT DETECTED.  The SARS-CoV-2 RNA is generally detectable in upper and lower respiratory specimens during the acute phase of infection. Negative results do not preclude SARS-CoV-2 infection, do not rule out co-infections with other pathogens, and should not be  used as the sole basis for treatment or other patient management decisions. Negative results must be combined with clinical observations, patient history, and epidemiological information. The expected result is Negative.  Fact Sheet for Patients: 11/02/20  Fact Sheet for Healthcare Providers: HairSlick.no  This test is not yet approved or cleared by the quierodirigir.com FDA and  has been authorized for detection and/or diagnosis of SARS-CoV-2 by FDA under an Emergency Use Authorization (EUA). This EUA will remain  in effect (meaning  this test can be used) for the duration of the COVID-19 declaration under Se ction 564(b)(1) of the Act, 21 U.S.C. section 360bbb-3(b)(1), unless the authorization is terminated or revoked sooner.  Performed at [facility], [address], [place], [place] [address]   Blood culture (routine x 2)     Status: None (Preliminary result)   Collection Time: 10/31/20  6:47 PM   Specimen: BLOOD  Result Value Ref Range Status   Specimen Description BLOOD SITE NOT SPECIFIED  Final   Special Requests   Final    BOTTLES DRAWN AEROBIC AND ANAEROBIC Blood Culture results may not be optimal due to an inadequate volume of blood received in culture bottles   Culture   Final    NO GROWTH 2 DAYS Performed at [facility], [address] [place], [place] [address]    Report Status PENDING  Incomplete  Blood culture (routine x 2)     Status: None (Preliminary result)   Collection Time: 10/31/20  9:35 PM   Specimen: BLOOD RIGHT HAND  Result Value Ref Range Status   Specimen Description BLOOD RIGHT HAND  Final   Special Requests   Final    BOTTLES DRAWN AEROBIC AND ANAEROBIC Blood Culture results may not be optimal due to an inadequate volume of blood received in culture bottles   Culture   Final    NO GROWTH 2 DAYS Performed at [facility], [address] [place], [place] [address]     Report Status PENDING  Incomplete          Radiology Studies: DG Ankle 2 Views Left  Result Date: 11/01/2020 CLINICAL DATA:  Edema EXAM: LEFT ANKLE - 2 VIEW COMPARISON:  08/26/2003 FINDINGS: Frontal and lateral views of the left ankle are obtained. There are no acute displaced fractures. There is significant osteoarthritis involving the talocalcaneal joint. Mild joint space narrowing of the tibiotalar joint. Diffuse soft tissue edema. IMPRESSION: 1. Prominent hindfoot osteoarthritis. 2. Diffuse soft tissue edema. Electronically Signed   By: Sharlet Salina M.D.   On: 11/01/2020 18:54   VAS [place] ABI WITH/WO TBI  Result Date: 11/03/2020  LOWER EXTREMITY DOPPLER STUDY Patient Name:  Marilyn Sexton  Date of Exam:   11/03/2020 Medical Rec #: [id number]           Accession #:    [id number] Date of Birth: Jan 10, 1946           Patient Gender: F Patient Age:   33 years Exam Location:  [facility] Procedure:      VAS [place] ABI WITH/WO TBI Referring Phys: Jon Billings Harpreet Pompey --------------------------------------------------------------------------------  Indications: Cellulitis. Patient endorses claudication with walking around the              grocery store. High Risk Factors: Hypertension, Diabetes, past history of smoking, coronary                    artery disease.  Comparison Study: Prior ABI done 11/10/2017 Performing Technologist: Sherren Kerns RVS  Examination Guidelines: A complete evaluation includes at minimum, Doppler waveform signals and systolic blood pressure reading at the level of bilateral brachial, anterior tibial, and posterior tibial arteries, when vessel segments are accessible. Bilateral testing is considered an integral part of a complete examination. Photoelectric Plethysmograph (PPG) waveforms and toe systolic pressure readings are included as required and additional duplex testing as needed. Limited examinations for reoccurring indications may be performed as noted.  ABI Findings:  +---------+------------------+-----+-----------+--------+  Right    Rt Pressure (mmHg)IndexWaveform   Comment  +---------+------------------+-----+-----------+--------+ Brachial 146                    multiphasic         +---------+------------------+-----+-----------+--------+ PTA      107               0.71 biphasic            +---------+------------------+-----+-----------+--------+ DP       109               0.72 biphasic            +---------+------------------+-----+-----------+--------+ Great Toe77                0.51                     +---------+------------------+-----+-----------+--------+ +---------+------------------+-----+-----------+-------+ Left     Lt Pressure (mmHg)IndexWaveform   Comment +---------+------------------+-----+-----------+-------+ Brachial 151                    multiphasic        +---------+------------------+-----+-----------+-------+ PTA      99                0.66 biphasic           +---------+------------------+-----+-----------+-------+ DP       106               0.70 biphasic           +---------+------------------+-----+-----------+-------+ Great Toe61                0.40                    +---------+------------------+-----+-----------+-------+ +-------+-----------+-----------+------------+------------+ ABI/TBIToday's ABIToday's TBIPrevious ABIPrevious TBI +-------+-----------+-----------+------------+------------+ Right  0.72       0.51       0.91        0.73         +-------+-----------+-----------+------------+------------+ Left   0.70       0.40       0.86        0.80         +-------+-----------+-----------+------------+------------+  Right and Left ABIs appear decreased compared to prior study on 11/10/2017. Right and Left TBIs appear decreased compared to prior study on 11/10/2017.  Summary: Right: Resting right ankle-brachial index indicates moderate right lower extremity arterial disease.  The right toe-brachial index is abnormal. Left: Resting left ankle-brachial index indicates mild left lower extremity arterial disease. The left toe-brachial index is abnormal.  *See table(s) above for measurements and observations.     Preliminary         Scheduled Meds:  amLODipine  5 mg Oral Daily   aspirin EC  81 mg Oral Daily   benztropine  0.5 mg Oral BID   cyanocobalamin  1,000 mcg Intramuscular Daily   docusate sodium  100 mg Oral BID   doxepin  3 mg Oral QHS   heparin  5,000 Units Subcutaneous Q8H   insulin aspart  0-9 Units Subcutaneous TID WC   insulin aspart protamine- aspart  20 Units Subcutaneous BID WC   ipratropium-albuterol  3 mL Nebulization BID   metoprolol tartrate  50 mg Oral BID   mirabegron ER  50 mg Oral Daily   risperiDONE  1 mg Oral BID   Continuous Infusions:  ceFEPime (MAXIPIME) IV 2 g (11/03/20 0834)   vancomycin 150 mL/hr at 11/03/20 [phone]  LOS: 3 days    Time spent:35 minutes    Alba CoryBelkys A Alper Guilmette, MD [facility]   If 7PM-7AM, please contact night-coverage [email or web address]  11/03/2020, 1:34 PM

## 2020-11-03 NOTE — Progress Notes (Addendum)
Patient wheezing, increase HR due to possible breathing treatment, notified on-call, lasix ordered placed. Will continue to monitor.     11/03/20 1707  Assess: MEWS Score  Temp 98.7 F (37.1 C)  BP (!) 163/56  Pulse Rate (!) 105  ECG Heart Rate (!) 103  Resp (!) 21  Level of Consciousness Alert  SpO2 98 %  O2 Device Room Air  Assess: MEWS Score  MEWS Temp 0  MEWS Systolic 0  MEWS Pulse 1  MEWS RR 1  MEWS LOC 0  MEWS Score 2  MEWS Score Color Yellow  Assess: if the MEWS score is Yellow or Red  Were vital signs taken at a resting state? Yes  Focused Assessment Change from prior assessment (see assessment flowsheet) (wheezing, increase HR)  Early Detection of Sepsis Score *See Row Information* High  MEWS guidelines implemented *See Row Information* Yes  Treat  Pain Scale 0-10  Pain Score 0  Take Vital Signs  Increase Vital Sign Frequency  Yellow: Q 2hr X 2 then Q 4hr X 2, if remains yellow, continue Q 4hrs  Escalate  MEWS: Escalate Yellow: discuss with charge nurse/RN and consider discussing with provider and RRT  Notify: Charge Nurse/RN  Name of Charge Nurse/RN Notified Deania Siguenza RN  Date Charge Nurse/RN Notified 11/03/20  Time Charge Nurse/RN Notified 1723  Notify: Provider  Provider Name/Title Regalado  Date Provider Notified 11/03/20  Time Provider Notified 1723  Notification Type Page  Notification Reason Other (Comment) (wheezing, increased HR, increased RR)  Document  Patient Outcome Other (Comment) (breathing treatment given)  Progress note created (see row info) Yes

## 2020-11-03 NOTE — Progress Notes (Signed)
Physical Therapy Evaluation Patient Details Name: Marilyn Sexton MRN: [id number] DOB: September 06, 1945 Today's Date: 11/03/2020   History of Present Illness  75 year old presented with lower extremity edema and erythema left lower extremity and ankle, for 4 days prior to admission; diagnosed with LLE cellulitis; with past medical history significant for diabetes type 2, hypertension, CAD  Clinical Impression   Pt admitted with above diagnosis. Comes from home where she lives with husband in a single story home with one step to enter;  Typically walks with RW at baseline; Presents to PT with L Lower leg, ankle, and foot pain, which is effecting her activity tolerance and functional mobility; requires min assist for transfers, and is able to take a few steps with minimal weight bearing on LLE; I anticipate good progress as her infection clears; Pt currently with functional limitations due to the deficits listed below (see PT Problem List). Pt will benefit from skilled PT to increase their independence and safety with mobility to allow discharge to the venue listed below.       Follow Up Recommendations Home health PT;Supervision/Assistance - 24 hour;Other (comment) (Will likely decline HHPT)    Equipment Recommendations  None recommended by PT (pretty well-equipped)    Recommendations for Other Services       Precautions / Restrictions Restrictions Weight Bearing Restrictions: No      Mobility  Bed Mobility Overal bed mobility: Needs Assistance Bed Mobility: Supine to Sit     Supine to sit: Min guard     General bed mobility comments: min guard for safety, heavy use of rails, HOB elevated    Transfers Overall transfer level: Needs assistance Equipment used: Rolling walker (2 wheeled) Transfers: Sit to/from [employer] Sit to Stand: Min assist Stand pivot transfers: Min assist       General transfer comment: cues for hand placement and safety; good use of UEs  for support and to unweigh painful LLE; good heel-toe pivot on RLE  Ambulation/Gait Ambulation/Gait assistance: Min assist Gait Distance (Feet): 4 Feet (forwards and backwards) Assistive device: Rolling walker (2 wheeled) Gait Pattern/deviations: Step-to pattern Gait velocity: slow   General Gait Details: Cues for sequence and to use RW to unwiegh painful LLE in stance tolerated a few steps forward and a few steps backward before needing to sit  Stairs            Wheelchair Mobility    Modified Rankin (Stroke Patients Only)       Balance     Sitting balance-Leahy Scale: Fair     Standing balance support: Bilateral upper extremity supported Standing balance-Leahy Scale: Poor                               Pertinent Vitals/Pain Pain Assessment: Faces Faces Pain Scale: Hurts even more Pain Location: L foot with weight bearing Pain Descriptors / Indicators: Discomfort;Grimacing;Headache;Pins and needles Pain Intervention(s): Monitored during session    Home Living Family/patient expects to be discharged to:: Private residence Living Arrangements: Spouse/significant other Available Help at Discharge: Family;Available 24 hours/day Type of Home: House Home Access: Stairs to enter   [employer] of Steps: 1 Home Layout: One level Home Equipment: Walker - 2 wheels;Cane - single point;Bedside commode;Grab bars - tub/shower;Wheelchair - manual      Prior Function Level of Independence: Independent with assistive device(s)         Comments: pt used RW for all mobility, reports being  indep with all ADls     Hand Dominance        Extremity/Trunk Assessment   Upper Extremity Assessment Upper Extremity Assessment: Defer to OT evaluation    Lower Extremity Assessment Lower Extremity Assessment: LLE deficits/detail LLE Deficits / Details: Erythema and swelling lower leg, ankle, and foot; but imporved compared to yesterday's marking of  outline; very painful foot in dependent position and with weight bearing LLE: Unable to fully assess due to pain    Cervical / Trunk Assessment Cervical / Trunk Assessment: Normal;Other exceptions (incrased body habitus)  Communication   Communication: No difficulties  Cognition Arousal/Alertness: Awake/alert Behavior During Therapy: WFL for tasks assessed/performed;Flat affect Overall Cognitive Status: Within Functional Limits for tasks assessed                                        General Comments      Exercises     Assessment/Plan    PT Assessment Patient needs continued PT services  PT Problem List Decreased strength;Decreased range of motion;Decreased activity tolerance;Decreased balance;Decreased mobility;Decreased knowledge of use of DME;Pain       PT Treatment Interventions DME instruction;Gait training;Stair training;Functional mobility training;Therapeutic activities;Therapeutic exercise;Balance training;Patient/family education    PT Goals (Current goals can be found in the Care Plan section)  Acute Rehab PT Goals Patient Stated Goal: home PT Goal Formulation: With patient/family Time For Goal Achievement: 11/17/20 Potential to Achieve Goals: Good    Frequency Min 3X/week   Barriers to discharge        Co-evaluation               AM-PAC PT "6 Clicks" Mobility  Outcome Measure Help needed turning from your back to your side while in a flat bed without using bedrails?: None Help needed moving from lying on your back to sitting on the side of a flat bed without using bedrails?: A Little Help needed moving to and from a bed to a chair (including a wheelchair)?: A Little Help needed standing up from a chair using your arms (e.g., wheelchair or bedside chair)?: A Little Help needed to walk in hospital room?: A Little Help needed climbing 3-5 steps with a railing? : A Lot 6 Click Score: 18    End of Session Equipment Utilized During  Treatment: Gait belt Activity Tolerance: Patient tolerated treatment well;Patient limited by pain Patient left: in chair;with call bell/phone within reach;with chair alarm set Nurse Communication: Mobility status PT Visit Diagnosis: Unsteadiness on feet (R26.81);Other abnormalities of gait and mobility (R26.89);Pain Pain - Right/Left: Left Pain - part of body: Ankle and joints of foot;Leg    Time: 1209-1229 PT Time Calculation (min) (ACUTE ONLY): 20 min   Charges:   PT Evaluation $PT Eval Moderate Complexity: 1 Mod          Van Clines, PT  Acute Rehabilitation Services Pager [phone] Office [phone]   Levi Aland 11/03/2020, 2:23 PM

## 2020-11-03 NOTE — Plan of Care (Signed)

## 2020-11-03 NOTE — Consult Note (Signed)
[facility]       Reason for Consult:cellulitis    Referring Physician: Dr. Sunnie Nielsen  Principal Problem:   Cellulitis of left leg Active Problems:   AKI (acute kidney injury) (HCC)   Type 2 diabetes mellitus without complication (HCC)   Anemia   Cellulitis    amLODipine  5 mg Oral Daily   aspirin EC  81 mg Oral Daily   benztropine  0.5 mg Oral BID   cyanocobalamin  1,000 mcg Intramuscular Daily   docusate sodium  100 mg Oral BID   doxepin  3 mg Oral QHS   heparin  5,000 Units Subcutaneous Q8H   insulin aspart  0-9 Units Subcutaneous TID WC   insulin aspart protamine- aspart  20 Units Subcutaneous BID WC   ipratropium-albuterol  3 mL Nebulization BID   metoprolol tartrate  50 mg Oral BID   mirabegron ER  50 mg Oral Daily   risperiDONE  1 mg Oral BID    Recommendations: Cefazolin Stop vancomycin and cefepime   Assessment: She has cellulitis and came in with erythema and swelling up her leg that is now nearly resolved.  She has some erythema and warmth now limited to her foot and ankle area.  No fever, no chills. She has been on very broad spectrum antibiotics but cellulitis is non-purulent and no risk for Pseudomonas.  Will change antibiotics as above.     Antibiotics: Vancomycin and cefepime  HPI: Marilyn Sexton is a 75 y.o. female with a history of ankle injury with surgery of her ankle about 10 years ago and chronic swelling came in with left left edema and erythema most c/w cellulitis.  No mention of any warmth but the patient did endorse warmth of the leg.  No fever or chills.  Markings of her left leg at the initial site of erythema noted and has had significant regression from the ink border markings.  She had a leukocytosis of 20.1 initially from her first ED visit and has trended down.     Review of Systems:  Constitutional: negative for fevers and chills Respiratory: negative for cough Gastrointestinal: negative for nausea  and diarrhea All other systems reviewed and are negative    Past Medical History:  Diagnosis Date   Diabetes mellitus without complication (HCC)    Dyspnea    Hypertension    Myocardial infarction (HCC)    Thyroid disease     Social History   Tobacco Use   Smoking status: Former    Packs/day: 1.00    Years: 44.00    Pack years: 44.00    Types: Cigarettes    Quit date: 2006    Years since quitting: 16.6   Smokeless tobacco: Never  Vaping Use   Vaping Use: Never used  Substance Use Topics   Alcohol use: No   Drug use: No    History reviewed. No pertinent family history.  No Known Allergies  Physical Exam: Constitutional: in no apparent distress  Vitals:   11/03/20 0754 11/03/20 0810  BP: (!) 141/48   Pulse: 92   Resp: 17   Temp: 98.5 F (36.9 C)   SpO2: 93% 95%   EYES: anicteric ENMT: no thrush Cardiovascular: Cor RRR Respiratory: normal respiratory effort Musculoskeletal: left leg with erythema, warmth and edema on the medical malleolus, some erythema and warmth on the top of her foot, some minimal erythema over the shin area Skin: no rash other than above  Lab Results  Component Value Date   WBC 12.8 (H) 11/03/2020   HGB 8.7 (L) 11/03/2020   HCT 26.6 (L) 11/03/2020   MCV 102.7 (H) 11/03/2020   PLT 291 11/03/2020    Lab Results  Component Value Date   CREATININE 1.21 (H) 11/03/2020   BUN 18 11/03/2020   NA 132 (L) 11/03/2020   K 4.5 11/03/2020   CL 102 11/03/2020   CO2 22 11/03/2020    Lab Results  Component Value Date   ALT 15 11/01/2020   AST 20 11/01/2020   ALKPHOS 74 11/01/2020     Microbiology: Recent Results (from the past 240 hour(s))  SARS CORONAVIRUS 2 (TAT 6-24 HRS) Nasopharyngeal Nasopharyngeal Swab     Status: None   Collection Time: 10/31/20  6:34 PM   Specimen: Nasopharyngeal Swab  Result Value Ref Range Status   SARS Coronavirus 2 NEGATIVE NEGATIVE Final    Comment: (NOTE) SARS-CoV-2 target nucleic acids are NOT  DETECTED.  The SARS-CoV-2 RNA is generally detectable in upper and lower respiratory specimens during the acute phase of infection. Negative results do not preclude SARS-CoV-2 infection, do not rule out co-infections with other pathogens, and should not be used as the sole basis for treatment or other patient management decisions. Negative results must be combined with clinical observations, patient history, and epidemiological information. The expected result is Negative.  Fact Sheet for Patients: HairSlick.no  Fact Sheet for Healthcare Providers: quierodirigir.com  This test is not yet approved or cleared by the [place] FDA and  has been authorized for detection and/or diagnosis of SARS-CoV-2 by FDA under an Emergency Use Authorization (EUA). This EUA will remain  in effect (meaning this test can be used) for the duration of the COVID-19 declaration under Se ction 564(b)(1) of the Act, 21 U.S.C. section 360bbb-3(b)(1), unless the authorization is terminated or revoked sooner.  Performed at [facility], [address] [place], [place] [address]   Blood culture (routine x 2)     Status: None (Preliminary result)   Collection Time: 10/31/20  6:47 PM   Specimen: BLOOD  Result Value Ref Range Status   Specimen Description BLOOD SITE NOT SPECIFIED  Final   Special Requests   Final    BOTTLES DRAWN AEROBIC AND ANAEROBIC Blood Culture results may not be optimal due to an inadequate volume of blood received in culture bottles   Culture   Final    NO GROWTH 3 DAYS Performed at [facility], [address] [place], [place] [address]    Report Status PENDING  Incomplete  Blood culture (routine x 2)     Status: None (Preliminary result)   Collection Time: 10/31/20  9:35 PM   Specimen: BLOOD RIGHT HAND  Result Value Ref Range Status   Specimen Description BLOOD RIGHT HAND  Final   Special Requests   Final     BOTTLES DRAWN AEROBIC AND ANAEROBIC Blood Culture results may not be optimal due to an inadequate volume of blood received in culture bottles   Culture   Final    NO GROWTH 3 DAYS Performed at [facility], [address], [place], [place] [address]    Report Status PENDING  Incomplete    Gardiner Barefoot, MD [facility] [email or web address] 11/03/2020, 2:33 PM

## 2020-11-03 NOTE — Progress Notes (Signed)
VASCULAR LAB    ABIs have been performed.  See CV proc for preliminary results.   Kenai Fluegel, RVT 11/03/2020, 11:08 AM

## 2020-11-03 NOTE — Consult Note (Signed)
Hospital Consult    Reason for Consult: Left leg swelling/cellulitis with concern for abnormal ABIs Referring Physician: Dr. Mardelle Matte MRN #:  [id number]  History of Present Illness: This is a 75 y.o. female with history of hypertension, diabetes, coronary artery disease, obesity who vascular surgery has been consulted for left leg swelling with some cellulitis and concern for abnormal ABIs.  Patient states she developed redness on the left leg that prompted her admission to the hospital.  She does endorse burning in the left calf when she walks over the last year consistent with calf claudication.  States she can usually walk 200 feet before she has to stop and then she can start walking again.  No open wounds or tissue loss at this time.  No rest pain at night.  Denies any previous lower extremity revascularizations.  Left leg venous duplex was negative for DVT  Past Medical History:  Diagnosis Date   Diabetes mellitus without complication (HCC)    Dyspnea    Hypertension    Myocardial infarction [facility])    Thyroid disease     Past Surgical History:  Procedure Laterality Date   CARDIAC CATHETERIZATION     CHOLECYSTECTOMY     coronary stents     EYE SURGERY     bilateral cataracts   KNEE ARTHROSCOPY Right 12/16/2016   Procedure: RIGHT KNEE ARTHROSCOPY AND DEBRIDEMENT;  Surgeon: Nadara Mustard, MD;  Location: [facility] OR;  Service: Orthopedics;  Laterality: Right;   TOTAL KNEE ARTHROPLASTY Right 12/31/2017   Procedure: RIGHT TOTAL KNEE ARTHROPLASTY;  Surgeon: Jodi Geralds, MD;  Location: [facility] ORS;  Service: Orthopedics;  Laterality: Right;  Adductor Block   TUBAL LIGATION      No Known Allergies  Prior to Admission medications   Medication Sig Start Date End Date Taking? Authorizing Provider  amLODipine (NORVASC) 5 MG tablet Take 5 mg by mouth daily.   Yes [provider]  aspirin EC 81 MG tablet Take 81 mg by mouth daily. Swallow whole.   Yes [provider]   benazepril (LOTENSIN) 20 MG tablet Take 20 mg by mouth 2 (two) times daily.   Yes [provider]  benztropine (COGENTIN) 0.5 MG tablet Take 1 tablet (0.5 mg total) by mouth 2 (two) times daily. 07/23/14  Yes Charm Rings, NP  docusate sodium (COLACE) 100 MG capsule Take 1 capsule (100 mg total) by mouth 2 (two) times daily. 12/31/17  Yes Marshia Ly, PA-C  Doxepin HCl 3 MG TABS Take 1 tablet by mouth at bedtime. 09/13/20  Yes [provider]  furosemide (LASIX) 20 MG tablet Take 20 mg by mouth daily.   Yes [provider]  insulin lispro protamine-lispro (HUMALOG MIX 75/25) (75-25) 100 UNIT/ML SUSP injection Inject 20 Units into the skin in the morning and at bedtime.   Yes [provider]  metoprolol tartrate (LOPRESSOR) 50 MG tablet Take 50 mg by mouth 2 (two) times daily.   Yes [provider]  mirabegron ER (MYRBETRIQ) 50 MG TB24 tablet Take 50 mg by mouth daily.    Yes [provider]  nitroGLYCERIN (NITROSTAT) 0.4 MG SL tablet Place 1 tablet (0.4 mg total) under the tongue every 5 (five) minutes x 3 doses as needed for chest pain. 08/16/17  Yes Rinaldo Cloud, MD  Propylene Glycol 0.6 % SOLN Place 2 drops into both eyes daily as needed (for dry eyes).    Yes [provider]  risperiDONE (RISPERDAL) 1 MG tablet Take 1  mg by mouth 2 (two) times daily.   Yes [provider]  aspirin EC 325 MG tablet Take 1 tablet (325 mg total) by mouth 2 (two) times daily after a meal. Take x 1 month post op to decrease risk of blood clots. Patient not taking: No sig reported 12/31/17   Marshia Ly, PA-C  atorvastatin (LIPITOR) 40 MG tablet Take 1 tablet (40 mg total) by mouth daily at 6 PM. Patient not taking: Reported on 10/31/2020 08/17/17   Rinaldo Cloud, MD    Social History   Socioeconomic History   Marital status: Married    Spouse name: Not on file   Number of children: Not on file   Years of education: Not on file    Highest education level: Not on file  Occupational History   Not on file  Tobacco Use   Smoking status: Former    Packs/day: 1.00    Years: 44.00    Pack years: 44.00    Types: Cigarettes    Quit date: 2006    Years since quitting: 16.6   Smokeless tobacco: Never  Vaping Use   Vaping Use: Never used  Substance and Sexual Activity   Alcohol use: No   Drug use: No   Sexual activity: Not on file  Other Topics Concern   Not on file  Social History Narrative   Not on file   Social Determinants of Health   Financial Resource Strain: Not on file  Food Insecurity: Not on file  Transportation Needs: Not on file  Physical Activity: Not on file  Stress: Not on file  Social Connections: Not on file  Intimate Partner Violence: Not on file     History reviewed. No pertinent family history.  ROS: [x]  Positive   [ ]  Negative   [ ]  All sytems reviewed and are negative  Cardiovascular: []  chest pain/pressure []  palpitations []  SOB lying flat []  DOE []  pain in legs while walking []  pain in legs at rest []  pain in legs at night []  non-healing ulcers []  hx of DVT [x]  swelling in legs - left  Pulmonary: []  productive cough []  asthma/wheezing []  home O2  Neurologic: []  weakness in []  arms []  legs []  numbness in []  arms []  legs []  hx of CVA []  mini stroke [] difficulty speaking or slurred speech []  temporary loss of vision in one eye []  dizziness  Hematologic: []  hx of cancer []  bleeding problems []  problems with blood clotting easily  Endocrine:   []  diabetes []  thyroid disease  GI []  vomiting blood []  blood in stool  GU: []  CKD/renal failure []  HD--[]  M/W/F or []  T/T/S []  burning with urination []  blood in urine  Psychiatric: []  anxiety []  depression  Musculoskeletal: []  arthritis []  joint pain  Integumentary: []  rashes []  ulcers  Constitutional: []  fever []  chills   Physical Examination  Vitals:   11/03/20 0754 11/03/20 0810  BP: (!)  141/48   Pulse: 92   Resp: 17   Temp: 98.5 F (36.9 C)   SpO2: 93% 95%   Body mass index is 36.69 kg/m.  General:  WDWN in NAD Gait: Not observed HENT: WNL, normocephalic Pulmonary: normal non-labored breathing, without Rales, rhonchi,  wheezing Cardiac: regular, without  Murmurs, rubs or gallops Abdomen: soft, NT/ND Vascular Exam/Pulses: Palpable femoral pulses bilaterally Left DP 1+ palpable No palpable right pedal pulses 2+ edema of the left lower extremity No distal ulcerations or open wounds Extremities: Feet are warm without overt ischemic changes  Musculoskeletal: no muscle wasting or atrophy  Neurologic: A&O X 3; Appropriate Affect ; SENSATION: normal; MOTOR FUNCTION:  moving all extremities equally. Speech is fluent/normal  CBC    Component Value Date/Time   WBC 12.8 (H) 11/03/2020 0747   RBC 2.59 (L) 11/03/2020 0747   HGB 8.7 (L) 11/03/2020 0747   HCT 26.6 (L) 11/03/2020 0747   PLT 291 11/03/2020 0747   MCV 102.7 (H) 11/03/2020 0747   MCH 33.6 11/03/2020 0747   MCHC 32.7 11/03/2020 0747   RDW 15.9 (H) 11/03/2020 0747   LYMPHSABS 1.0 10/31/2020 1846   MONOABS 1.0 10/31/2020 1846   EOSABS 0.0 10/31/2020 1846   BASOSABS 0.0 10/31/2020 1846    BMET    Component Value Date/Time   NA 132 (L) 11/03/2020 0747   K 4.5 11/03/2020 0747   CL 102 11/03/2020 0747   CO2 22 11/03/2020 0747   GLUCOSE 139 (H) 11/03/2020 0747   BUN 18 11/03/2020 0747   CREATININE 1.21 (H) 11/03/2020 0747   CALCIUM 8.7 (L) 11/03/2020 0747   GFRNONAA 47 (L) 11/03/2020 0747   GFRAA >60 01/04/2018 0920    COAGS: Lab Results  Component Value Date   INR 0.87 12/22/2017   INR 1.0 06/07/2007     Non-Invasive Vascular Imaging:    ABIs today are 0.72 on the right biphasic with a toe pressure 77 and on the left 0.7 biphasic with a toe pressure of 61   ASSESSMENT/PLAN: This is a 75 y.o. female that vascular surgery has been consulted for left lower extremity swelling with cellulitis  and concern for abnormal ABIs.  I discussed with the patient and her husband at bedside that she does have mildly abnormal ABIs that would typically be in the range consistent with claudication.  She does endorse claudication of the left calf over the last year.  She has no evidence of critical limb ischemia including rest pain or tissue loss at this time.  Her feet are warm.  If you look at her noninvasive imaging she has pulsatile toe tracings with a left great toe pressure above 60 that should be more than adequate for wound healing.  I do not feel strongly that her cellulitis is a primary arterial insufficiency etiology.  In fact I can feel palpable 1+ dorsalis pedis pulse on the left foot on exam.  Happy to follow her while she is here in the hospital, but likely will recommend outpatient follow-up unless she has worsening of her clinical exam or some other change that would warrant arteriogram this admission.  I also discussed with her and her husband that claudication is typically managed conservatively without surgical intervention including lifestyle modifications with walking therapies, Pletal,, etc.    Cephus Shelling, MD [facility] Office: [phone]  Cephus Shelling

## 2020-11-03 NOTE — TOC Initial Note (Signed)
Transition of Care [facility]) - Initial/Assessment Note    Patient Details  Name: Marilyn Sexton MRN: [id number] Date of Birth: 1945/03/15  Transition of Care [facility]) CM/SW Contact:    Bess Kinds, RN Phone Number: [phone] 11/03/2020, 5:06 PM  Clinical Narrative:                  Spoke with patient and spouse at the bedside to discuss transition planning. PTA home with spouse. Has DME at home to include walker, 3/1, and wc. Verified demographics and PCP.   Discussed recommendations for [facility] PT/OT. Patient respectfully declines home health at this time. Discussed that PCP can refer to home health if she changes her mind.   Discussed transportation home. Patient and spouse feel comfortable with patient transporting in private vehicle.   Discussed with patient consult for medication assistance - specific concerns not provided in the consult.  Patient has insurance. Patient did not express concerns about any particular medication cost.   TOC following for transition needs.   Expected Discharge Plan: Home/Self Care Barriers to Discharge: Continued Medical Work up   Patient Goals and CMS Choice Patient states their goals for this hospitalization and ongoing recovery are:: return home with husband CMS Medicare.gov Compare Post Acute Care list provided to:: Patient Choice offered to / list presented to : Patient  Expected Discharge Plan and Services Expected Discharge Plan: Home/Self Care   Discharge Planning Services: CM Consult Post Acute Care Choice: Home Health Living arrangements for the past 2 months: Single Family Home                 DME Arranged: N/A DME Agency: NA       HH Arranged: Refused HH HH Agency: NA        Prior Living Arrangements/Services Living arrangements for the past 2 months: Single Family Home Lives with:: Self, Spouse Patient language and need for interpreter reviewed:: Yes Do you feel safe going back to the place where you live?: Yes       Need for Family Participation in Patient Care: Yes (Comment) Care giver support system in place?: Yes (comment) Current home services: DME Criminal Activity/Legal Involvement Pertinent to Current Situation/Hospitalization: No - Comment as needed  Activities of Daily Living Home Assistive Devices/Equipment: None ADL Screening (condition at time of admission) Patient's cognitive ability adequate to safely complete daily activities?: Yes Is the patient deaf or have difficulty hearing?: No Does the patient have difficulty seeing, even when wearing glasses/contacts?: No Does the patient have difficulty concentrating, remembering, or making decisions?: No Patient able to express need for assistance with ADLs?: No Does the patient have difficulty dressing or bathing?: No Independently performs ADLs?: Yes (appropriate for developmental age) Does the patient have difficulty walking or climbing stairs?: Yes Weakness of Legs: Both Weakness of Arms/Hands: None  Permission Sought/Granted Permission sought to share information with : Family Supports    Share Information with NAME: Amma Crear     Permission granted to share info w Relationship: spouse  Permission granted to share info w Contact Information: [phone]  Emotional Assessment Appearance:: Appears stated age Attitude/Demeanor/Rapport: Engaged Affect (typically observed): Accepting Orientation: : Oriented to Self, Oriented to Place, Oriented to  Time, Oriented to Situation Alcohol / Substance Use: Not Applicable Psych Involvement: No (comment)  Admission diagnosis:  Cellulitis [L03.90] Infection [B99.9] Cellulitis of left leg [L03.116] Patient Active Problem List   Diagnosis Date Noted   Anemia 11/01/2020   Cellulitis 11/01/2020   Cellulitis of  left leg 10/31/2020   Hyponatremia 01/02/2018   AKI (acute kidney injury) (HCC) 01/02/2018   Type 2 diabetes mellitus without complication (HCC) 01/02/2018   COPD with  acute exacerbation (HCC) 01/02/2018   Primary osteoarthritis of right knee 12/31/2017   Acute coronary syndrome (HCC) 08/15/2017   Old complex tear of lateral meniscus of right knee    Loose body in knee, right knee    Bipolar affective disorder, manic, moderate (HCC) 07/23/2014   History of medication noncompliance    Manic behavior (HCC)    PCP:  Adrian Prince, MD Pharmacy:   [facility] [place] - [address] [address] [address] [place] [address] Phone: [phone] Fax: [phone]     Social Determinants of Health (SDOH) Interventions    Readmission Risk Interventions No flowsheet data found.

## 2020-11-03 NOTE — Progress Notes (Signed)
Pharmacy Antibiotic Note  Marilyn Sexton is a 75 y.o. female admitted on 10/31/2020 with LLE cellulitis.  Pharmacy has been consulted for Cefazolin dosing. Patient with a history of ankle injury with surgery of her ankle about 10 years ago and chronic swelling came in with left left edema and erythema most c/w cellulitis. No fever or chills.  Markings of her left leg at the initial site of erythema noted and has had significant regression from the ink border markings. WBC 12.6 trending down since admission and Scr 1.21 improving.  Plan: Cefazolin 2 G IV Q8H  Monitor renal function and clinical status  Follow up for LOT     Height: 5\' 6"  (167.6 cm) Weight: 103.1 kg (227 lb 4.7 oz) IBW/kg (Calculated) : 59.3  Temp (24hrs), Avg:98.6 F (37 C), Min:98.5 F (36.9 C), Max:98.7 F (37.1 C)  Recent Labs  Lab 10/30/20 1426 10/31/20 1846 10/31/20 1847 10/31/20 2135 11/01/20 0253 11/02/20 1039 11/03/20 0747  WBC 20.1* 15.0*  --   --  12.6* 13.0* 12.8*  CREATININE 2.32* 1.71*  --   --  1.49* 1.41* 1.21*  LATICACIDVEN  --   --  1.9 1.1  --   --   --      Estimated Creatinine Clearance: 50.2 mL/min (A) (by C-G formula based on SCr of 1.21 mg/dL (H)).    No Known Allergies  Admission Antibiotics:  8/26 Cefepime >> 8/28  8/25 Vanc >> 8/28 8/28 Cefazolin >>   Microbiology Data:  8/25 Bcx>> ngtd  9/25, PharmD, MBA Pharmacy Resident [phone] 11/03/2020 3:04 PM

## 2020-11-03 NOTE — Progress Notes (Signed)
Occupational Therapy Evaluation Patient Details Name: Marilyn Sexton MRN: [id number] DOB: [birth year] Today's Date: 11/03/2020    History of Present Illness 75 year old presented with lower extremity edema and erythema left lower extremity and ankle, for 4 days prior to admission; diagnosed with LLE cellulitis; with past medical history significant for diabetes type 2, hypertension, CAD   Clinical Impression   Marilyn Sexton was evaluated s/p the above LLE cellulitis. PTA she was mod I with use of RW, and did not required assist for ADLs. She lives in a 1 level home with her husband with 1 STE. Upon evaluation, pt was limited by pain and required HOB elevated and heavy use of rails for sup>sit. Pt required max encouragement to transfer OOB with min A to power up and balance during pivot with RW. Currently her Adls are requiring up to max A for lower body standing tasks. Pt would benefit from continued OT acutely to progress towards her mod I baseline. Recommend HHOT however pt declining all follow up therapies at the time of this eval.     Follow Up Recommendations  Home health OT;Supervision/Assistance - 24 hour (During discussion, pt declined all follow up therapies. However pt eager to go home, and would benefit from [facility] if accepting.)    Equipment Recommendations  None recommended by OT (pt has all necessary equipment)       Precautions / Restrictions Precautions Precautions: Fall Restrictions Weight Bearing Restrictions: No      Mobility Bed Mobility Overal bed mobility: Needs Assistance Bed Mobility: Supine to Sit     Supine to sit: Min guard;HOB elevated     General bed mobility comments: min guard for safety, heavy use of rails, HOB elevated    Transfers Overall transfer level: Needs assistance Equipment used: Rolling walker (2 wheeled) Transfers: Sit to/from [employer] Sit to Stand: Min assist Stand pivot transfers: Min assist       General transfer  comment: min A for powerign up into standing and for balance during stand pivot transfer. Pt attempted to sit prior to being safely at the chair and required vc    Balance Overall balance assessment: Needs assistance Sitting-balance support: Feet supported Sitting balance-Leahy Scale: Fair     Standing balance support: Bilateral upper extremity supported Standing balance-Leahy Scale: Poor                             ADL either performed or assessed with clinical judgement   ADL Overall ADL's : Needs assistance/impaired Eating/Feeding: Independent;Sitting   Grooming: Set up;Sitting   Upper Body Bathing: Set up;Sitting   Lower Body Bathing: Maximal assistance;Sit to/from stand   Upper Body Dressing : Set up;Sitting   Lower Body Dressing: Maximal assistance;Sit to/from stand Lower Body Dressing Details (indicate cue type and reason): total A for bilat socks this session Toilet Transfer: Minimal assistance;Stand-pivot;RW;BSC Toilet Transfer Details (indicate cue type and reason): stand pivot towards the R only Toileting- Clothing Manipulation and Hygiene: Maximal assistance;Sit to/from stand       Functional mobility during ADLs: Minimal assistance;Rolling walker (stand pivot only. pt refused further mobility) General ADL Comments: pt required incrased assist for all lower body tasks due to BUE reliant on external support for standing balance. Pt has poor standing tolerance which creates safety concerns wtih OOB activity      Pertinent Vitals/Pain Pain Assessment: Faces Faces Pain Scale: Hurts even more Pain Location: L foot with weight bearing Pain Descriptors /  Indicators: Discomfort;Grimacing;Headache;Pins and needles Pain Intervention(s): Limited activity within patient's tolerance;Monitored during session;Repositioned;Patient requesting pain meds-RN notified     Hand Dominance     Extremity/Trunk Assessment Upper Extremity Assessment Upper Extremity  Assessment: Generalized weakness;Overall [facility] for tasks assessed   Lower Extremity Assessment Lower Extremity Assessment: Defer to PT evaluation   Cervical / Trunk Assessment Cervical / Trunk Assessment: Normal;Other exceptions (incrased body habitus)   Communication Communication Communication: No difficulties   Cognition Arousal/Alertness: Awake/alert Behavior During Therapy: WFL for tasks assessed/performed;Flat affect Overall Cognitive Status: Within Functional Limits for tasks assessed               General Comments: Pt slightly agitated, wants to go home asap. Limited insight into her deficits and safety   General Comments  VSS on RA, pt noted with some wheezing breath sounds, SpO2 between 92-94% throughout session. After dicussing follow up rehab, pt refusing post acute rehab stay and home health rehab. Pt's husband reporeted that they had a poor experience with home health therapies several years ago.            Home Living Family/patient expects to be discharged to:: Private residence Living Arrangements: Spouse/significant other Available Help at Discharge: Family;Available 24 hours/day Type of Home: House Home Access: Stairs to enter [employer] of Steps: 1   Home Layout: One level     Bathroom Shower/Tub: Producer, television/film/video: Standard     Home Equipment: Environmental consultant - 2 wheels;Cane - single point;Bedside commode;Grab bars - tub/shower;Wheelchair - manual          Prior Functioning/Environment Level of Independence: Independent with assistive device(s)        Comments: pt used RW for all mobility, reports being indep with all ADls        OT Problem List: Decreased strength;Decreased range of motion;Decreased activity tolerance;Impaired balance (sitting and/or standing);Decreased safety awareness;Decreased knowledge of use of DME or AE;Decreased knowledge of precautions;Pain      OT Treatment/Interventions: Self-care/ADL  training;Therapeutic exercise;DME and/or AE instruction;Therapeutic activities;Balance training;Patient/family education    OT Goals(Current goals can be found in the care plan section) Acute Rehab OT Goals Patient Stated Goal: home OT Goal Formulation: With patient Time For Goal Achievement: 11/17/20 Potential to Achieve Goals: Fair ADL Goals Pt Will Perform Lower Body Bathing: with min guard assist;sit to/from stand Pt Will Perform Lower Body Dressing: with min guard assist;sit to/from stand Pt Will Transfer to Toilet: with supervision;ambulating;bedside commode Pt Will Perform Toileting - Clothing Manipulation and hygiene: with supervision;sit to/from stand Pt Will Perform Tub/Shower Transfer: with min guard assist;Stand pivot transfer;rolling walker;ambulating;shower seat  OT Frequency: Min 2X/week    AM-PAC OT "6 Clicks" Daily Activity     Outcome Measure Help from another person eating meals?: None Help from another person taking care of personal grooming?: A Little Help from another person toileting, which includes using toliet, bedpan, or urinal?: A Lot Help from another person bathing (including washing, rinsing, drying)?: A Lot Help from another person to put on and taking off regular upper body clothing?: None Help from another person to put on and taking off regular lower body clothing?: A Lot 6 Click Score: 17   End of Session Equipment Utilized During Treatment: Gait belt;Rolling walker Nurse Communication: Mobility status;Precautions;Weight bearing status  Activity Tolerance: Patient limited by pain Patient left: in chair;with call bell/phone within reach;with family/visitor present  OT Visit Diagnosis: Unsteadiness on feet (R26.81);Other abnormalities of gait and mobility (R26.89);Muscle weakness (generalized) (M62.81);Pain  Time: 6389-3734 OT Time Calculation (min): 28 min Charges:  OT General Charges $OT Visit: 1 Visit OT Evaluation $OT Eval  Moderate Complexity: 1 Mod OT Treatments $Self Care/Home Management : 8-22 mins    Karianna Gusman A Adeli Frost 11/03/2020, 10:22 AM

## 2020-11-03 NOTE — Progress Notes (Signed)
Physical Therapy Note  PT eval complete with full note to follow;  Recommend Home with husband for transition out of hospital;  Recommend HHPT as well -- likely pt will decline HH therapies;  Pt very much wants to dc home, and if she progresses as anticipated with functional mobility, will likely be able to dc home tomorrow from PT perspective; Will follow,    Van Clines, PT  Acute Rehabilitation Services Pager [phone] Office [phone]

## 2020-11-04 DIAGNOSIS — L03116 Cellulitis of left lower limb: Secondary | ICD-10-CM

## 2020-11-04 LAB — CBC
HCT: 27.9 % — ABNORMAL LOW (ref 36.0–46.0)
Hemoglobin: 9.1 g/dL — ABNORMAL LOW (ref 12.0–15.0)
MCH: 33.7 pg (ref 26.0–34.0)
MCHC: 32.6 g/dL (ref 30.0–36.0)
MCV: 103.3 fL — ABNORMAL HIGH (ref 80.0–100.0)
Platelets: 388 10*3/uL (ref 150–400)
RBC: 2.7 MIL/uL — ABNORMAL LOW (ref 3.87–5.11)
RDW: 16 % — ABNORMAL HIGH (ref 11.5–15.5)
WBC: 12.2 10*3/uL — ABNORMAL HIGH (ref 4.0–10.5)
nRBC: 0 % (ref 0.0–0.2)

## 2020-11-04 LAB — BASIC METABOLIC PANEL
Anion gap: 10 (ref 5–15)
BUN: 15 mg/dL (ref 8–23)
CO2: 27 mmol/L (ref 22–32)
Calcium: 9 mg/dL (ref 8.9–10.3)
Chloride: 96 mmol/L — ABNORMAL LOW (ref 98–111)
Creatinine, Ser: 1.27 mg/dL — ABNORMAL HIGH (ref 0.44–1.00)
GFR, Estimated: 45 mL/min — ABNORMAL LOW (ref >60)
Glucose, Bld: 118 mg/dL — ABNORMAL HIGH (ref 70–99)
Potassium: 4.2 mmol/L (ref 3.5–5.1)
Sodium: 133 mmol/L — ABNORMAL LOW (ref 135–145)

## 2020-11-04 LAB — GLUCOSE, CAPILLARY
Glucose-Capillary: 132 mg/dL — ABNORMAL HIGH (ref 70–99)
Glucose-Capillary: 141 mg/dL — ABNORMAL HIGH (ref 70–99)
Glucose-Capillary: 147 mg/dL — ABNORMAL HIGH (ref 70–99)
Glucose-Capillary: 127 mg/dL — ABNORMAL HIGH (ref 70–99)

## 2020-11-04 LAB — ANA W/REFLEX IF POSITIVE: Anti Nuclear Antibody (ANA): NEGATIVE

## 2020-11-04 MED ORDER — FUROSEMIDE 10 MG/ML IJ SOLN
20.0000 mg | Freq: Once | INTRAMUSCULAR | Status: AC
  Administered 2020-11-04: 20 mg via INTRAVENOUS
  Filled 2020-11-04: qty 2

## 2020-11-04 MED ORDER — VITAMIN B-12 1000 MCG PO TABS
1000.0000 ug | ORAL_TABLET | Freq: Every day | ORAL | Status: DC
  Administered 2020-11-05: 1000 ug via ORAL
  Filled 2020-11-04: qty 1

## 2020-11-04 NOTE — Progress Notes (Signed)
Physical Therapy Treatment Patient Details Name: Marilyn Sexton MRN: [id number] DOB: March 11, 1945 Today's Date: 11/04/2020    History of Present Illness 75 year old presented with lower extremity edema and erythema left lower extremity and ankle, for 4 days prior to admission; diagnosed with LLE cellulitis; with past medical history significant for diabetes type 2, hypertension, CAD    PT Comments    Continuing work on functional mobility and activity tolerance;  Making excellent progress with functional mobility; Still quite painful, but Marilyn Sexton is able to walk in room distance well with RW; practiced single step up and down with good performance; on track for dc home with husband assist   Follow Up Recommendations  No PT follow up;Other (comment) (pt declines HHPT)     Equipment Recommendations  None recommended by PT (pretty well-equipped)    Recommendations for Other Services       Precautions / Restrictions Precautions Precautions: Fall Precaution Comments: Fall risk still present, but decreased Restrictions Weight Bearing Restrictions: No    Mobility  Bed Mobility Overal bed mobility: Needs Assistance             General bed mobility comments: pt sitting on EOB upon arrival    Transfers Overall transfer level: Needs assistance Equipment used: Rolling walker (2 wheeled) Transfers: Sit to/from Stand Sit to Stand: Min guard         General transfer comment: cues for hand placement. min guard for safety  Ambulation/Gait Ambulation/Gait assistance: Min guard Gait Distance (Feet): 15 Feet Assistive device: Rolling walker (2 wheeled) Gait Pattern/deviations: Step-through pattern (emerging) Gait velocity: slow   General Gait Details: Accepting more weight onto LLE, and good use of RW for support; no loss of balance; still very painful, and requesting to sit down shortly after stair training   Stairs Stairs: Yes Stairs assistance: Min guard Stair  Management: No rails;Backwards;With walker Number of Stairs: 1 General stair comments: demo cues for technique and sequence; step-by-step cues while pt was perfomeing task; managed well   Wheelchair Mobility    Modified Rankin (Stroke Patients Only)       Balance Overall balance assessment: Needs assistance Sitting-balance support: Feet supported Sitting balance-Leahy Scale: Good     Standing balance support: Single extremity supported;During functional activity Standing balance-Leahy Scale: Fair Standing balance comment: pt able to complete functional task in standing with one hand supported externally                            Cognition Arousal/Alertness: Awake/alert Behavior During Therapy: WFL for tasks assessed/performed;Flat affect Overall Cognitive Status: Within Functional Limits for tasks assessed                                        Exercises      General Comments General comments (skin integrity, edema, etc.): VSS on RA, pt with incrased RR after functional activity. Pt cued for breathing technqiues throughout and recovered well after short rest break      Pertinent Vitals/Pain Pain Assessment: 0-10 Pain Score: 6  Faces Pain Scale: Hurts little more Pain Location: L foot with weight bearing Pain Descriptors / Indicators: Discomfort;Grimacing Pain Intervention(s): Monitored during session    Home Living                      Prior Function  PT Goals (current goals can now be found in the care plan section) Acute Rehab PT Goals Patient Stated Goal: home PT Goal Formulation: With patient/family Time For Goal Achievement: 11/17/20 Potential to Achieve Goals: Good Progress towards PT goals: Progressing toward goals    Frequency    Min 3X/week      PT Plan Current plan remains appropriate;Other (comment) (except pt delcining HHPT)    Co-evaluation              AM-PAC PT "6 Clicks"  Mobility   Outcome Measure  Help needed turning from your back to your side while in a flat bed without using bedrails?: None Help needed moving from lying on your back to sitting on the side of a flat bed without using bedrails?: None Help needed moving to and from a bed to a chair (including a wheelchair)?: A Little Help needed standing up from a chair using your arms (e.g., wheelchair or bedside chair)?: A Little Help needed to walk in hospital room?: A Little Help needed climbing 3-5 steps with a railing? : A Little 6 Click Score: 20    End of Session Equipment Utilized During Treatment: Gait belt Activity Tolerance: Patient tolerated treatment well;Patient limited by pain Patient left: in chair;with call bell/phone within reach;with chair alarm set Nurse Communication: Mobility status PT Visit Diagnosis: Unsteadiness on feet (R26.81);Other abnormalities of gait and mobility (R26.89);Pain Pain - Right/Left: Left Pain - part of body: Ankle and joints of foot;Leg     Time: 2956-2130 PT Time Calculation (min) (ACUTE ONLY): 30 min  Charges:  $Gait Training: 23-37 mins                     Marilyn Sexton, [place]  Acute Rehabilitation Services Pager [phone] Office [phone]    Marilyn Sexton 11/04/2020, 1:01 PM

## 2020-11-04 NOTE — Progress Notes (Signed)
[facility]  Subjective  -states her left leg is still swollen   Objective (!) 156/57 96 99 F (37.2 C) (Oral) 16 94%  Intake/Output Summary (Last 24 hours) at 11/04/2020 0819 Last data filed at 11/04/2020 0634 Gross per 24 hour  Intake 103.25 ml  Output 1400 ml  Net -1296.75 ml    Left DP pulse 1+ palpable  Laboratory Lab Results: Recent Labs    11/03/20 0747 11/04/20 0308  WBC 12.8* 12.2*  HGB 8.7* 9.1*  HCT 26.6* 27.9*  PLT 291 388   BMET Recent Labs    11/03/20 0747 11/04/20 0308  NA 132* 133*  K 4.5 4.2  CL 102 96*  CO2 22 27  GLUCOSE 139* 118*  BUN 18 15  CREATININE 1.21* 1.27*  CALCIUM 8.7* 9.0    COAG Lab Results  Component Value Date   INR 0.87 12/22/2017   INR 1.0 06/07/2007   No results found for: PTT  Assessment/Planning:  75 year old female admitted with cellulitis and swelling of the left leg.  Vascular was consulted yesterday for abnormal ABIs.  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  She has a 1+ palpable DP pulse in the left foot.  I do not think there is any role for arteriogram at this time and we would follow her as an outpatient.  I do not think this is a primary arterial insufficiency problem.  Foot is warm and she has pulsatile toe pressure that is more than adequate for wound healing.  And she does not have any open wounds at this time.  Cephus Shelling 11/04/2020 8:19 AM --

## 2020-11-04 NOTE — Progress Notes (Signed)
PROGRESS NOTE    Marilyn Sexton  ZOX:[id number] DOB: September 20, 1945 DOA: 10/31/2020 PCP: Adrian PrinceSouth, Stephen, MD   Brief Narrative: 75 year old with past medical history significant for diabetes type 2, hypertension, CAD presented with lower extremity edema and erythema left lower extremity and ankle, for 4 days prior to admission.  Dopplers was negative for DVT.  She is admitted with cellulitis white count of 15,000, AKI with a creatinine up to 2.3.  Hyponatremia.   Assessment & Plan:   Principal Problem:   Cellulitis of left leg Active Problems:   AKI (acute kidney injury) (HCC)   Type 2 diabetes mellitus without complication (HCC)   Anemia   Cellulitis  1-Left LE Cellulitis;  Received vancomycin and cefepime day 3.  X ray; edema no joint effusion.  Follow Blood culture. No growth to date.  WBC at 13 from 12. --12 ABI ; mild disease left, evaluated by vascular  no intervention needed.  ANCA and ANA panel pending.  No improvement of edema or redness, ID consulted.  Started on IV cefazolin, discharge on Cephalexin.  Repeat low dose lasix.   AKI; improved with IV fluids. Cr peak to 2.  NLS fluids.   Hyponatremia; Improved with Fluids.   CAD Continue with aspirin and BB  Diabetes; Continue with Novolog 70/30  Anemia; iron deficiency and B 12 deficiency.  B 12: 104.  She will need iron supplement when infection is controlled.  Started  B 12 IM  Probably COPD; Continue with Nebulizer.   Depression; Continue with risperidal.     Estimated body mass index is 36.69 kg/m as calculated from the following:   Height as of this encounter: 5\' 6"  (1.676 m).   Weight as of this encounter: 103.1 kg.   DVT prophylaxis: heparin  Code Status: Full Code Family Communication: care discussed with patient and Husband who was at bedside. 8/28 Disposition Plan:  Status is: Inpatient  Remains inpatient appropriate because:IV treatments appropriate due to intensity of illness or  inability to take PO  Dispo: The patient is from: Home              Anticipated d/c is to: Home              Patient currently is not medically stable to d/c.   Difficult to place patient No        Consultants:  none  Procedures:  none  Antimicrobials:    Subjective: Denies dyspnea.  BL wheezing.  LE redness improved.   Objective: Vitals:   11/04/20 0000 11/04/20 0500 11/04/20 0756 11/04/20 1100  BP: (!) 145/55 124/66 (!) 156/57 (!) 139/50  Pulse: 91 90 96 81  Resp: 17 18 16    Temp: 98.9 F (37.2 C) 98.8 F (37.1 C) 99 F (37.2 C)   TempSrc:   Oral   SpO2: 95% 92% 94%   Weight:      Height:        Intake/Output Summary (Last 24 hours) at 11/04/2020 1256 Last data filed at 11/04/2020 1216 Gross per 24 hour  Intake 103.25 ml  Output 1700 ml  Net -1596.75 ml    Filed Weights   11/01/20 1431 11/02/20 0500 11/03/20 0500  Weight: 99.5 kg 101.9 kg 103.1 kg    Examination:  General exam: NAD Respiratory system: BL wheezing Cardiovascular system: S 1, S 2 RRR Gastrointestinal system: BS present,soft, nt Central nervous system: Alert Extremities: Left LE with less redness and edema  Data Reviewed: I have personally reviewed following  labs and imaging studies  CBC: Recent Labs  Lab 10/30/20 1426 10/31/20 1846 11/01/20 0253 11/02/20 1039 11/03/20 0747 11/04/20 0308  WBC 20.1* 15.0* 12.6* 13.0* 12.8* 12.2*  NEUTROABS 17.8* 12.9*  --   --   --   --   HGB 10.5* 9.7* 9.6* 8.6* 8.7* 9.1*  HCT 32.5* 29.1* 28.2* 26.6* 26.6* 27.9*  MCV 105.2* 102.1* 100.0 103.5* 102.7* 103.3*  PLT 248 249 248 254 291 388    Basic Metabolic Panel: Recent Labs  Lab 10/31/20 1846 11/01/20 0253 11/02/20 1039 11/03/20 0747 11/04/20 0308  NA 127* 129* 132* 132* 133*  K 4.2 4.0 4.1 4.5 4.2  CL 95* 97* 102 102 96*  CO2 22 23 22 22 27   GLUCOSE 138* 151* 146* 139* 118*  BUN 36* 32* 19 18 15   CREATININE 1.71* 1.49* 1.41* 1.21* 1.27*  CALCIUM 8.7* 8.6* 8.5* 8.7* 9.0     GFR: Estimated Creatinine Clearance: 47.8 mL/min (A) (by C-G formula based on SCr of 1.27 mg/dL (H)). Liver Function Tests: Recent Labs  Lab 10/30/20 1426 11/01/20 0253  AST 35 20  ALT 16 15  ALKPHOS 80 74  BILITOT 0.9 1.0  PROT 6.3* 5.7*  ALBUMIN 3.3* 3.0*    No results for input(s): LIPASE, AMYLASE in the last 168 hours. No results for input(s): AMMONIA in the last 168 hours. Coagulation Profile: No results for input(s): INR, PROTIME in the last 168 hours. Cardiac Enzymes: No results for input(s): CKTOTAL, CKMB, CKMBINDEX, TROPONINI in the last 168 hours. BNP (last 3 results) No results for input(s): PROBNP in the last 8760 hours. HbA1C: No results for input(s): HGBA1C in the last 72 hours.  CBG: Recent Labs  Lab 11/03/20 1137 11/03/20 1618 11/03/20 2009 11/04/20 0626 11/04/20 1101  GLUCAP 198* 150* 141* 132* 141*    Lipid Profile: No results for input(s): CHOL, HDL, LDLCALC, TRIG, CHOLHDL, LDLDIRECT in the last 72 hours. Thyroid Function Tests: No results for input(s): TSH, T4TOTAL, FREET4, T3FREE, THYROIDAB in the last 72 hours. Anemia Panel: No results for input(s): VITAMINB12, FOLATE, FERRITIN, TIBC, IRON, RETICCTPCT in the last 72 hours.  Sepsis Labs: Recent Labs  Lab 10/31/20 1847 10/31/20 2135  LATICACIDVEN 1.9 1.1     Recent Results (from the past 240 hour(s))  SARS CORONAVIRUS 2 (TAT 6-24 HRS) Nasopharyngeal Nasopharyngeal Swab     Status: None   Collection Time: 10/31/20  6:34 PM   Specimen: Nasopharyngeal Swab  Result Value Ref Range Status   SARS Coronavirus 2 NEGATIVE NEGATIVE Final    Comment: (NOTE) SARS-CoV-2 target nucleic acids are NOT DETECTED.  The SARS-CoV-2 RNA is generally detectable in upper and lower respiratory specimens during the acute phase of infection. Negative results do not preclude SARS-CoV-2 infection, do not rule out co-infections with other pathogens, and should not be used as the sole basis for treatment  or other patient management decisions. Negative results must be combined with clinical observations, patient history, and epidemiological information. The expected result is Negative.  Fact Sheet for Patients: 2136  Fact Sheet for Healthcare Providers: 11/02/20  This test is not yet approved or cleared by the [place] FDA and  has been authorized for detection and/or diagnosis of SARS-CoV-2 by FDA under an Emergency Use Authorization (EUA). This EUA will remain  in effect (meaning this test can be used) for the duration of the COVID-19 declaration under Se ction 564(b)(1) of the Act, 21 U.S.C. section 360bbb-3(b)(1), unless the authorization is terminated or revoked sooner.  Performed at [facility], [address] [place], [place] [address]   Blood culture (routine x 2)     Status: None (Preliminary result)   Collection Time: 10/31/20  6:47 PM   Specimen: BLOOD  Result Value Ref Range Status   Specimen Description BLOOD SITE NOT SPECIFIED  Final   Special Requests   Final    BOTTLES DRAWN AEROBIC AND ANAEROBIC Blood Culture results may not be optimal due to an inadequate volume of blood received in culture bottles   Culture   Final    NO GROWTH 4 DAYS Performed at [facility], [address] [place], [place] [address]    Report Status PENDING  Incomplete  Blood culture (routine x 2)     Status: None (Preliminary result)   Collection Time: 10/31/20  9:35 PM   Specimen: BLOOD RIGHT HAND  Result Value Ref Range Status   Specimen Description BLOOD RIGHT HAND  Final   Special Requests   Final    BOTTLES DRAWN AEROBIC AND ANAEROBIC Blood Culture results may not be optimal due to an inadequate volume of blood received in culture bottles   Culture   Final    NO GROWTH 4 DAYS Performed at [facility], [address] [place], [place] [address]    Report Status PENDING  Incomplete           Radiology Studies: VAS [place] ABI WITH/WO TBI  Result Date: 11/03/2020  LOWER EXTREMITY DOPPLER STUDY Patient Name:  ROSSY VIRAG  Date of Exam:   11/03/2020 Medical Rec #: [id number]           Accession #:    [id number] Date of Birth: May 06, 1945           Patient Gender: F Patient Age:   38 years Exam Location:  [facility] Procedure:      VAS [place] ABI WITH/WO TBI Referring Phys: Jon Billings Roderica Cathell --------------------------------------------------------------------------------  Indications: Cellulitis. Patient endorses claudication with walking around the              grocery store. High Risk Factors: Hypertension, Diabetes, past history of smoking, coronary                    artery disease.  Comparison Study: Prior ABI done 11/10/2017 Performing Technologist: Sherren Kerns RVS  Examination Guidelines: A complete evaluation includes at minimum, Doppler waveform signals and systolic blood pressure reading at the level of bilateral brachial, anterior tibial, and posterior tibial arteries, when vessel segments are accessible. Bilateral testing is considered an integral part of a complete examination. Photoelectric Plethysmograph (PPG) waveforms and toe systolic pressure readings are included as required and additional duplex testing as needed. Limited examinations for reoccurring indications may be performed as noted.  ABI Findings: +---------+------------------+-----+-----------+--------+ Right    Rt Pressure (mmHg)IndexWaveform   Comment  +---------+------------------+-----+-----------+--------+ Brachial 146                    multiphasic         +---------+------------------+-----+-----------+--------+ PTA      107               0.71 biphasic            +---------+------------------+-----+-----------+--------+ DP       109               0.72 biphasic            +---------+------------------+-----+-----------+--------+ Leanne Lovely  0.51                      +---------+------------------+-----+-----------+--------+ +---------+------------------+-----+-----------+-------+ Left     Lt Pressure (mmHg)IndexWaveform   Comment +---------+------------------+-----+-----------+-------+ Brachial 151                    multiphasic        +---------+------------------+-----+-----------+-------+ PTA      99                0.66 biphasic           +---------+------------------+-----+-----------+-------+ DP       106               0.70 biphasic           +---------+------------------+-----+-----------+-------+ Great Toe61                0.40                    +---------+------------------+-----+-----------+-------+ +-------+-----------+-----------+------------+------------+ ABI/TBIToday's ABIToday's TBIPrevious ABIPrevious TBI +-------+-----------+-----------+------------+------------+ Right  0.72       0.51       0.91        0.73         +-------+-----------+-----------+------------+------------+ Left   0.70       0.40       0.86        0.80         +-------+-----------+-----------+------------+------------+  Right and Left ABIs appear decreased compared to prior study on 11/10/2017. Right and Left TBIs appear decreased compared to prior study on 11/10/2017.  Summary: Right: Resting right ankle-brachial index indicates moderate right lower extremity arterial disease. The right toe-brachial index is abnormal. Left: Resting left ankle-brachial index indicates mild left lower extremity arterial disease. The left toe-brachial index is abnormal.  *See table(s) above for measurements and observations.  Electronically signed by Sherald Hess MD on 11/03/2020 at 2:24:12 PM.    Final         Scheduled Meds:  amLODipine  5 mg Oral Daily   aspirin EC  81 mg Oral Daily   benztropine  0.5 mg Oral BID   docusate sodium  100 mg Oral BID   doxepin  3 mg Oral QHS   heparin  5,000 Units Subcutaneous Q8H   insulin aspart  0-9 Units  Subcutaneous TID WC   insulin aspart protamine- aspart  20 Units Subcutaneous BID WC   ipratropium-albuterol  3 mL Nebulization BID   metoprolol tartrate  50 mg Oral BID   mirabegron ER  50 mg Oral Daily   risperiDONE  1 mg Oral BID   vitamin B-12  1,000 mcg Oral Daily   Continuous Infusions:   ceFAZolin (ANCEF) IV 2 g (11/04/20 0616)     LOS: 4 days    Time spent:35 minutes    Shakayla Hickox A Aylah Yeary, MD [facility]   If 7PM-7AM, please contact night-coverage [email or web address]  11/04/2020, 12:56 PM

## 2020-11-04 NOTE — Progress Notes (Addendum)
Occupational Therapy Treatment Patient Details Name: Marilyn LEVITAN MRN: [id number] DOB: 18-Mar-1945 Today's Date: 11/04/2020    History of present illness 75 year old presented with lower extremity edema and erythema left lower extremity and ankle, for 4 days prior to admission; diagnosed with LLE cellulitis; with past medical history significant for diabetes type 2, hypertension, CAD   OT comments  Pt's bed alarm going off, attempting to get OOB with husband upon my arrival. Pt demonstrated improved WB tolerance this session and was able to ambulate with rw from bed>bathroom and complete all transfers with min guard for safety only. Pt completed all toileting and hygiene needs with min guard, and was set up for grooming in sitting. PT demonstrated increased RR after functional activity and required vc for breathing and rest break to recover. Pt continued to benefit from continues OT. Recommend d/c home with HHOT.    Follow Up Recommendations  Home health OT;Supervision/Assistance - 24 hour    Equipment Recommendations  None recommended by OT       Precautions / Restrictions Precautions Precautions: Fall Restrictions Weight Bearing Restrictions: No       Mobility Bed Mobility Overal bed mobility: Needs Assistance             General bed mobility comments: pt sitting on EOB upon arrival    Transfers Overall transfer level: Needs assistance Equipment used: Rolling walker (2 wheeled) Transfers: Sit to/from Stand Sit to Stand: Min guard         General transfer comment: cues for hand placement. min guard for safety, incrased time    Balance Overall balance assessment: Needs assistance Sitting-balance support: Feet supported Sitting balance-Leahy Scale: Good     Standing balance support: Single extremity supported;During functional activity Standing balance-Leahy Scale: Fair Standing balance comment: pt able to complete functional task in standing with one hand  supported externally                           ADL either performed or assessed with clinical judgement   ADL Overall ADL's : Needs assistance/impaired     Grooming: Set up;Sitting Grooming Details (indicate cue type and reason): declined completing oral hygiene while standing at the sink. grroming completed with set up while sitting                 Toilet Transfer: Min guard;Ambulation;RW;Regular Toilet;Grab bars Toilet Transfer Details (indicate cue type and reason): pt ambulated with RW from bed>bathroom given min guard for safety Toileting- Clothing Manipulation and Hygiene: Min guard;Sit to/from stand Toileting - Clothing Manipulation Details (indicate cue type and reason): pericare completed in standing with min guard for safety     Functional mobility during ADLs: Min guard;Rolling walker;Cueing for safety General ADL Comments: Pt tolerated increased fucntional ambualtion today with rw      Cognition Arousal/Alertness: Awake/alert Behavior During Therapy: WFL for tasks assessed/performed;Flat affect Overall Cognitive Status: Within Functional Limits for tasks assessed                    General Comments VSS on RA, pt with incrased RR after functional activity. Pt cued for breathing technqiues throughout and recovered well after short rest break    Pertinent Vitals/ Pain       Pain Assessment: Faces Faces Pain Scale: Hurts little more Pain Location: L foot with weight bearing Pain Descriptors / Indicators: Discomfort;Grimacing Pain Intervention(s): Limited activity within patient's tolerance;Monitored during session;Repositioned   Frequency  Min 2X/week        Progress Toward Goals  OT Goals(current goals can now be found in the care plan section)  Progress towards OT goals: Progressing toward goals  Acute Rehab OT Goals Patient Stated Goal: home OT Goal Formulation: With patient Time For Goal Achievement: 11/17/20 Potential to  Achieve Goals: Fair ADL Goals Pt Will Perform Lower Body Bathing: with min guard assist;sit to/from stand Pt Will Perform Lower Body Dressing: with min guard assist;sit to/from stand Pt Will Transfer to Toilet: with supervision;ambulating;bedside commode Pt Will Perform Toileting - Clothing Manipulation and hygiene: with supervision;sit to/from stand Pt Will Perform Tub/Shower Transfer: with min guard assist;Stand pivot transfer;rolling walker;ambulating;shower seat  Plan Discharge plan remains appropriate       AM-PAC OT "6 Clicks" Daily Activity     Outcome Measure   Help from another person eating meals?: None Help from another person taking care of personal grooming?: A Little Help from another person toileting, which includes using toliet, bedpan, or urinal?: A Little Help from another person bathing (including washing, rinsing, drying)?: A Lot Help from another person to put on and taking off regular upper body clothing?: None Help from another person to put on and taking off regular lower body clothing?: A Lot 6 Click Score: 18    End of Session Equipment Utilized During Treatment: Gait belt;Rolling walker  OT Visit Diagnosis: Unsteadiness on feet (R26.81);Other abnormalities of gait and mobility (R26.89);Muscle weakness (generalized) (M62.81);Pain   Activity Tolerance Patient tolerated treatment well   Patient Left in chair;with call bell/phone within reach;with family/visitor present   Nurse Communication Mobility status;Weight bearing status        Time: 1155-2080 OT Time Calculation (min): 22 min  Charges: OT General Charges $OT Visit: 1 Visit OT Treatments $Self Care/Home Management : 8-22 mins    Teon Hudnall A Zemirah Krasinski 11/04/2020, 11:04 AM

## 2020-11-04 NOTE — Progress Notes (Signed)
    Left LE cellulitis with symptoms of claudication and abnormal ABI's are 0.72 on the right biphasic with a toe pressure 77 and on the left 0.7 biphasic with a toe pressure of 61.   No tissue loss or rest pain.  Pitting edema palpable DP pulse.  A/P: PAD with left LE cellulitis/edema Palpable DP pulse, motor intact Cont. Antibiotics for cellulitis Encouraged mobility Recommend walking program, elevation when at rest  Maximum medical management F/U with [facility] for surveillance ABI's yearly If she develops rest pain or ischemic changes she will need an angiogram.   Mosetta Pigeon PA-C [facility] [phone]

## 2020-11-04 NOTE — Progress Notes (Signed)
[facility]   Reason for visit: Follow up on cellulitis  Interval History: WBC 12.2, remains afebrile.  Husband at bedside.  Able to ambulate on her own to bathroom this am.  Blood cultures remain negative.     Physical Exam: Constitutional:  Vitals:   11/04/20 0756 11/04/20 1100  BP: (!) 156/57 (!) 139/50  Pulse: 96 81  Resp: 16   Temp: 99 F (37.2 C)   SpO2: 94%    patient appears in NAD Respiratory: Normal respiratory effort MS: left leg with decreased erythema over foot compared to yesterday.  Less tenderness and less warmth.    Review of Systems: Constitutional: negative for fevers and chills Gastrointestinal: negative for nausea and diarrhea   Lab Results  Component Value Date   WBC 12.2 (H) 11/04/2020   HGB 9.1 (L) 11/04/2020   HCT 27.9 (L) 11/04/2020   MCV 103.3 (H) 11/04/2020   PLT 388 11/04/2020    Lab Results  Component Value Date   CREATININE 1.27 (H) 11/04/2020   BUN 15 11/04/2020   NA 133 (L) 11/04/2020   K 4.2 11/04/2020   CL 96 (L) 11/04/2020   CO2 27 11/04/2020    Lab Results  Component Value Date   ALT 15 11/01/2020   AST 20 11/01/2020   ALKPHOS 74 11/01/2020     Microbiology: Recent Results (from the past 240 hour(s))  SARS CORONAVIRUS 2 (TAT 6-24 HRS) Nasopharyngeal Nasopharyngeal Swab     Status: None   Collection Time: 10/31/20  6:34 PM   Specimen: Nasopharyngeal Swab  Result Value Ref Range Status   SARS Coronavirus 2 NEGATIVE NEGATIVE Final    Comment: (NOTE) SARS-CoV-2 target nucleic acids are NOT DETECTED.  The SARS-CoV-2 RNA is generally detectable in upper and lower respiratory specimens during the acute phase of infection. Negative results do not preclude SARS-CoV-2 infection, do not rule out co-infections with other pathogens, and should not be used as the sole basis for treatment or other patient management decisions. Negative results must be combined with clinical observations, patient  history, and epidemiological information. The expected result is Negative.  Fact Sheet for Patients: [employer]  Fact Sheet for Healthcare Providers: quierodirigir.com  This test is not yet approved or cleared by the [place] FDA and  has been authorized for detection and/or diagnosis of SARS-CoV-2 by FDA under an Emergency Use Authorization (EUA). This EUA will remain  in effect (meaning this test can be used) for the duration of the COVID-19 declaration under Se ction 564(b)(1) of the Act, 21 U.S.C. section 360bbb-3(b)(1), unless the authorization is terminated or revoked sooner.  Performed at [facility], [address], [place], [place] [address]   Blood culture (routine x 2)     Status: None (Preliminary result)   Collection Time: 10/31/20  6:47 PM   Specimen: BLOOD  Result Value Ref Range Status   Specimen Description BLOOD SITE NOT SPECIFIED  Final   Special Requests   Final    BOTTLES DRAWN AEROBIC AND ANAEROBIC Blood Culture results may not be optimal due to an inadequate volume of blood received in culture bottles   Culture   Final    NO GROWTH 4 DAYS Performed at [facility], [address] [place], [place] [address]    Report Status PENDING  Incomplete  Blood culture (routine x 2)     Status: None (Preliminary result)   Collection Time: 10/31/20  9:35 PM  Specimen: BLOOD RIGHT HAND  Result Value Ref Range Status   Specimen Description BLOOD RIGHT HAND  Final   Special Requests   Final    BOTTLES DRAWN AEROBIC AND ANAEROBIC Blood Culture results may not be optimal due to an inadequate volume of blood received in culture bottles   Culture   Final    NO GROWTH 4 DAYS Performed at [facility], [address] [place], [place] [address]    Report Status PENDING  Incomplete    Impression/Plan:  1. Cellulitis - continues to improve on cefazolin.  No new concerns.  I will transition  to oral cephalexin.  She can take this for 4 more days through 9/2, 500 mg 4 times a day.    2.  Acute renal insufficiency - creat today 1.27 and seems to be near her baseline.  Prerenal azotemia on admission with elevated BUN.    3.  Leukocytosis - trending down.  No new concerns.    Follow up scheduled for 9/7 at 3pm with me

## 2020-11-04 NOTE — Plan of Care (Signed)

## 2020-11-05 LAB — CULTURE, BLOOD (ROUTINE X 2)
Culture: NO GROWTH
Culture: NO GROWTH

## 2020-11-05 LAB — ANCA TITERS
Atypical P-ANCA titer: <1:20 {titer}
C-ANCA: <1:20 {titer}
P-ANCA: <1:20 {titer}

## 2020-11-05 LAB — GLUCOSE, CAPILLARY
Glucose-Capillary: 143 mg/dL — ABNORMAL HIGH (ref 70–99)
Glucose-Capillary: 142 mg/dL — ABNORMAL HIGH (ref 70–99)

## 2020-11-05 MED ORDER — CEPHALEXIN 500 MG PO CAPS: 500.0000 mg | ORAL_CAPSULE | Freq: Four times a day (QID) | ORAL | 0 refills | Status: AC

## 2020-11-05 MED ORDER — CEPHALEXIN 500 MG PO CAPS
500.0000 mg | ORAL_CAPSULE | Freq: Four times a day (QID) | ORAL | Status: DC
  Administered 2020-11-05: 500 mg via ORAL
  Filled 2020-11-05: qty 1

## 2020-11-05 MED ORDER — CYANOCOBALAMIN 1000 MCG PO TABS: 1000.0000 ug | ORAL_TABLET | Freq: Every day | ORAL | 0 refills | Status: DC

## 2020-11-05 MED ORDER — IPRATROPIUM-ALBUTEROL 20-100 MCG/ACT IN AERS: 1.0000 | INHALATION_SPRAY | Freq: Four times a day (QID) | RESPIRATORY_TRACT | 2 refills | Status: AC

## 2020-11-05 NOTE — Progress Notes (Signed)
AVS d/c teaching has been provided to pt at bedside by RN. Pt and husband both verbalized understanding of teaching and has received packet to take home. Pt's IV was removed and her personal belongings returned to her. Pt was escorted from the unit by staff. Pt showed had no complaints at this time and displayed no visible sign of distress.

## 2020-11-05 NOTE — Plan of Care (Signed)
  Problem: Education: Goal: Knowledge of General Education information will improve Description: Including pain rating scale, medication(s)/side effects and non-pharmacologic comfort measures Outcome: Progressing   Problem: Clinical Measurements: Goal: Ability to maintain clinical measurements within normal limits will improve Outcome: Progressing   Problem: Activity: Goal: Risk for activity intolerance will decrease Outcome: Progressing   Problem: Nutrition: Goal: Adequate nutrition will be maintained Outcome: Progressing   Problem: Coping: Goal: Level of anxiety will decrease Outcome: Progressing   Problem: Elimination: Goal: Will not experience complications related to bowel motility Outcome: Progressing   Problem: Safety: Goal: Ability to remain free from injury will improve Outcome: Progressing   Problem: Skin Integrity: Goal: Risk for impaired skin integrity will decrease Outcome: Progressing   

## 2020-11-05 NOTE — Plan of Care (Signed)
  Problem: Education: Goal: Knowledge of General Education information will improve Description: Including pain rating scale, medication(s)/side effects and non-pharmacologic comfort measures 11/05/2020 1125 by Marsh Dolly, RN Outcome: Adequate for Discharge 11/05/2020 1040 by Marsh Dolly, RN Outcome: Progressing   Problem: Health Behavior/Discharge Planning: Goal: Ability to manage health-related needs will improve Outcome: Adequate for Discharge   Problem: Clinical Measurements: Goal: Ability to maintain clinical measurements within normal limits will improve 11/05/2020 1125 by Marsh Dolly, RN Outcome: Adequate for Discharge 11/05/2020 1040 by Marsh Dolly, RN Outcome: Progressing Goal: Will remain free from infection Outcome: Adequate for Discharge Goal: Diagnostic test results will improve Outcome: Adequate for Discharge Goal: Respiratory complications will improve Outcome: Adequate for Discharge Goal: Cardiovascular complication will be avoided Outcome: Adequate for Discharge   Problem: Activity: Goal: Risk for activity intolerance will decrease 11/05/2020 1125 by Marsh Dolly, RN Outcome: Adequate for Discharge 11/05/2020 1040 by Marsh Dolly, RN Outcome: Progressing   Problem: Nutrition: Goal: Adequate nutrition will be maintained 11/05/2020 1125 by Marsh Dolly, RN Outcome: Adequate for Discharge 11/05/2020 1040 by Marsh Dolly, RN Outcome: Progressing   Problem: Coping: Goal: Level of anxiety will decrease 11/05/2020 1125 by Marsh Dolly, RN Outcome: Adequate for Discharge 11/05/2020 1040 by Marsh Dolly, RN Outcome: Progressing   Problem: Elimination: Goal: Will not experience complications related to bowel motility 11/05/2020 1125 by Marsh Dolly, RN Outcome: Adequate for Discharge 11/05/2020 1040 by Marsh Dolly, RN Outcome: Progressing Goal: Will not experience complications related to urinary retention Outcome: Adequate  for Discharge   Problem: Pain Managment: Goal: General experience of comfort will improve Outcome: Adequate for Discharge   Problem: Safety: Goal: Ability to remain free from injury will improve 11/05/2020 1125 by Marsh Dolly, RN Outcome: Adequate for Discharge 11/05/2020 1040 by Marsh Dolly, RN Outcome: Progressing   Problem: Skin Integrity: Goal: Risk for impaired skin integrity will decrease 11/05/2020 1125 by Marsh Dolly, RN Outcome: Adequate for Discharge 11/05/2020 1040 by Marsh Dolly, RN Outcome: Progressing

## 2020-11-05 NOTE — Progress Notes (Signed)
    Edema appears to be decreasing good skin lines, erythema appears to be dissipating as well.  Palpable DP pedal pulse, motor intact Lungs non labored breathing  A/P:PAD with left LE cellulitis/edema Received vancomycin and cefepime day 4  ID consulted Cefazolin Stop vancomycin and cefepime  No vascular intervention will f/u as out patient.  Palpable DP without tissue loss.  Mosetta Pigeon PA-C [facility] [phone]

## 2020-11-05 NOTE — Progress Notes (Signed)
Physical Therapy Treatment Patient Details Name: Marilyn Sexton MRN: [id number] DOB: 21-Oct-1945 Today's Date: 11/05/2020    History of Present Illness 75 year old presented with lower extremity edema and erythema left lower extremity and ankle, for 4 days prior to admission; diagnosed with LLE cellulitis; with past medical history significant for diabetes type 2, hypertension, CAD    PT Comments    Continuing work on functional mobility and activity tolerance;  Seemed quite pleased at the notion of going home today; Managing well walking to /from bathroom with RW; Encouraged her to increase activity at home as her L foot and ankle feel better, and use her RW as needed to Marilyn Sexton painful ankle with amb --- just make sure to keep walking! Stair trianing done; OK for dc home from PT standpoint   Follow Up Recommendations  No PT follow up;Other (comment) (pt declines HHPT)     Equipment Recommendations  None recommended by PT (pretty well-equipped)    Recommendations for Other Services       Precautions / Restrictions Precautions Precautions: Fall Precaution Comments: Fall risk still present, but decreased Restrictions Weight Bearing Restrictions: No    Mobility  Bed Mobility   Bed Mobility: Supine to Sit     Supine to sit: Supervision     General bed mobility comments: slow moving, but no need for physical assist    Transfers Overall transfer level: Needs assistance Equipment used: Rolling walker (2 wheeled) Transfers: Sit to/from Stand Sit to Stand: Min guard         General transfer comment: cues for hand placement; tends to pull up on RW (perhaps habit, not sure that it will change), so min guard for safety  Ambulation/Gait Ambulation/Gait assistance: Min guard Gait Distance (Feet): 30 Feet (to/from bathroom) Assistive device: Rolling walker (2 wheeled) Gait Pattern/deviations: Step-through pattern (emerging) Gait velocity: slow   General Gait Details:  short steps, but steady progress walking to the bathroom and back   Stairs             Wheelchair Mobility    Modified Rankin (Stroke Patients Only)       Balance     Sitting balance-Leahy Scale: Good       Standing balance-Leahy Scale: Fair                              Cognition Arousal/Alertness: Awake/alert Behavior During Therapy: WFL for tasks assessed/performed;Flat affect Overall Cognitive Status: Within Functional Limits for tasks assessed                                        Exercises      General Comments        Pertinent Vitals/Pain Pain Assessment: 0-10 Pain Score: 10-Worst pain ever Faces Pain Scale: Hurts little more Pain Location: L foot with weight bearing Pain Descriptors / Indicators: Discomfort;Grimacing Pain Intervention(s): Monitored during session;Premedicated before session    Home Living                      Prior Function            PT Goals (current goals can now be found in the care plan section) Acute Rehab PT Goals Patient Stated Goal: home PT Goal Formulation: With patient/family Time For Goal Achievement: 11/17/20 Potential to Achieve Goals: Good Progress towards  PT goals: Progressing toward goals    Frequency    Min 3X/week      PT Plan Current plan remains appropriate;Other (comment) (except pt delcining HHPT)    Co-evaluation              AM-PAC PT "6 Clicks" Mobility   Outcome Measure  Help needed turning from your back to your side while in a flat bed without using bedrails?: None Help needed moving from lying on your back to sitting on the side of a flat bed without using bedrails?: None Help needed moving to and from a bed to a chair (including a wheelchair)?: A Little Help needed standing up from a chair using your arms (e.g., wheelchair or bedside chair)?: A Little Help needed to walk in hospital room?: A Little Help needed climbing 3-5 steps with a  railing? : A Little 6 Click Score: 20    End of Session Equipment Utilized During Treatment: Gait belt Activity Tolerance: Patient tolerated treatment well;Patient limited by pain Patient left: in chair;with call bell/phone within reach Nurse Communication: Mobility status;Other (comment) (and ok for dc from PT standpoint) PT Visit Diagnosis: Unsteadiness on feet (R26.81);Other abnormalities of gait and mobility (R26.89);Pain Pain - Right/Left: Left Pain - part of body: Ankle and joints of foot;Leg     Time: 1030-1054 PT Time Calculation (min) (ACUTE ONLY): 24 min  Charges:  $Gait Training: 8-22 mins $Therapeutic Activity: 8-22 mins                     Van Clines, PT  Acute Rehabilitation Services Pager [phone] Office [phone]    Levi Aland 11/05/2020, 11:09 AM

## 2020-11-05 NOTE — Discharge Summary (Signed)
Physician Discharge Summary  Baron SaneBonnie F Feigel ZOX:[id number] DOB: Jul 05, 1945 DOA: 10/31/2020  PCP: Adrian PrinceSouth, Stephen, MD  Admit date: 10/31/2020 Discharge date: 11/05/2020  Admitted From: Home  Disposition:  Home   Recommendations for Outpatient Follow-up:  Follow up with PCP in 1-2 weeks Please obtain BMP/CBC in one week Needs to follow up with Dr Luciana Axeomer 9/07.  Home Health: None  Discharge Condition:Stable.  CODE STATUS: Full Code Diet recommendation: Heart Healthy   Brief/Interim Summary: 75 year old with past medical history significant for diabetes type 2, hypertension, CAD presented with lower extremity edema and erythema left lower extremity and ankle, for 4 days prior to admission.  Dopplers was negative for DVT.   She is admitted with cellulitis white count of 15,000, AKI with a creatinine up to 2.3.  Hyponatremia.  1-Left LE Cellulitis;  Received vancomycin and cefepime day 3.  X ray; edema no joint effusion.  Follow Blood culture. No growth to date.  WBC at 13 from 12. --12 ABI ; mild disease left, evaluated by vascular  no intervention needed.  ANCA and ANA panel pending.  No improvement of edema or redness, ID consulted.  Started on IV cefazolin, discharge on Cephalexin until 9/02 Improving. Stable for discharge.    AKI; improved with IV fluids. Cr peak to 2.  NLS fluids.    Hyponatremia; Improved with Fluids.    CAD Continue with aspirin and BB   Diabetes; Continue with Novolog 70/30   Anemia; iron deficiency and B 12 deficiency.  B 12: 104.  She will need iron supplement when infection is controlled.  Started  B 12 IM. Discharge on oral supplement.    Probably COPD; Continue with Nebulizer. discharge on Inhaler.    Depression; Continue with risperidal.        Estimated body mass index is 36.69 kg/m as calculated from the following:   Height as of this encounter: 5\' 6"  (1.676 m).   Weight as of this encounter: 103.1 kg.    Discharge Diagnoses:   Principal Problem:   Cellulitis of left leg Active Problems:   AKI (acute kidney injury) (HCC)   Type 2 diabetes mellitus without complication (HCC)   Anemia   Cellulitis    Discharge Instructions  Discharge Instructions     Diet - low sodium heart healthy   Complete by: As directed    Increase activity slowly   Complete by: As directed       Allergies as of 11/05/2020   No Known Allergies      Medication List     STOP taking these medications    atorvastatin 40 MG tablet Commonly known as: LIPITOR   benazepril 20 MG tablet Commonly known as: LOTENSIN       TAKE these medications    amLODipine 5 MG tablet Commonly known as: NORVASC Take 5 mg by mouth daily.   aspirin EC 81 MG tablet Take 81 mg by mouth daily. Swallow whole.   benztropine 0.5 MG tablet Commonly known as: COGENTIN Take 1 tablet (0.5 mg total) by mouth 2 (two) times daily.   cephALEXin 500 MG capsule Commonly known as: KEFLEX Take 1 capsule (500 mg total) by mouth every 6 (six) hours for 14 doses.   cyanocobalamin 1000 MCG tablet Take 1 tablet (1,000 mcg total) by mouth daily. Start taking on: November 06, 2020   docusate sodium 100 MG capsule Commonly known as: Colace Take 1 capsule (100 mg total) by mouth 2 (two) times daily.   Doxepin HCl  3 MG Tabs Take 1 tablet by mouth at bedtime.   furosemide 20 MG tablet Commonly known as: LASIX Take 20 mg by mouth daily.   HumaLOG Mix 75/25 (75-25) 100 UNIT/ML Susp injection Generic drug: insulin lispro protamine-lispro Inject 20 Units into the skin in the morning and at bedtime.   Ipratropium-Albuterol 20-100 MCG/ACT Aers respimat Commonly known as: COMBIVENT Inhale 1 puff into the lungs every 6 (six) hours.   metoprolol tartrate 50 MG tablet Commonly known as: LOPRESSOR Take 50 mg by mouth 2 (two) times daily.   mirabegron ER 50 MG Tb24 tablet Commonly known as: MYRBETRIQ Take 50 mg by mouth daily.   nitroGLYCERIN 0.4 MG SL  tablet Commonly known as: NITROSTAT Place 1 tablet (0.4 mg total) under the tongue every 5 (five) minutes x 3 doses as needed for chest pain.   Propylene Glycol 0.6 % Soln Place 2 drops into both eyes daily as needed (for dry eyes).   risperiDONE 1 MG tablet Commonly known as: RISPERDAL Take 1 mg by mouth 2 (two) times daily.        Follow-up Information     Adrian Prince, MD Follow up in 1 week(s).   Specialty: Endocrinology Contact information: [address] [place] [address] [phone]         Gardiner Barefoot, MD Follow up.   Specialty: Infectious Diseases Why: Follow up scheduled for 9/7 at 3pm to follow up on cellulitis. Contact information: [address] [place] [address] [phone]                No Known Allergies  Consultations: ID Vascular   Procedures/Studies: DG Chest 1 View  Result Date: 10/31/2020 CLINICAL DATA:  Infection. Patient reports chest pain and shortness of breath EXAM: CHEST  1 VIEW COMPARISON:  01/01/2018 FINDINGS: The cardiomediastinal contours are normal. Atherosclerosis of the aortic arch. Similar hyperinflation to prior exam. Mild central bronchial thickening. Pulmonary vasculature is normal. No consolidation, pleural effusion, or pneumothorax. No acute osseous abnormalities are seen. IMPRESSION: Mild central bronchial thickening and hyperinflation, may represent bronchitis, asthma, or sequela of smoking. No evidence of pneumonia. Electronically Signed   By: Narda Rutherford M.D.   On: 10/31/2020 19:11   DG Ankle 2 Views Left  Result Date: 11/01/2020 CLINICAL DATA:  Edema EXAM: LEFT ANKLE - 2 VIEW COMPARISON:  08/26/2003 FINDINGS: Frontal and lateral views of the left ankle are obtained. There are no acute displaced fractures. There is significant osteoarthritis involving the talocalcaneal joint. Mild joint space narrowing of the tibiotalar joint. Diffuse soft tissue edema. IMPRESSION: 1. Prominent  hindfoot osteoarthritis. 2. Diffuse soft tissue edema. Electronically Signed   By: Sharlet Salina M.D.   On: 11/01/2020 18:54   DG Foot 2 Views Left  Result Date: 10/31/2020 CLINICAL DATA:  Left leg pain for 4 days, swelling and erythema EXAM: LEFT FOOT - 2 VIEW COMPARISON:  None. FINDINGS: Frontal and lateral views of the left foot are obtained. There are no acute or destructive bony lesions. The bones are diffusely osteopenic. 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 There is extensive soft tissue edema. No radiopaque foreign bodies or subcutaneous gas. IMPRESSION: 1. Diffuse soft tissue edema. 2. Osteopenia, no acute or destructive bony process. 3. Osteoarthritis of the hindfoot and midfoot. Electronically Signed   By: Sharlet Salina M.D.   On: 10/31/2020 21:56   VAS [place] ABI WITH/WO TBI  Result Date: 11/03/2020  LOWER EXTREMITY DOPPLER STUDY Patient Name:  Ardelia Wrede  Shaddock  Date of Exam:   11/03/2020 Medical Rec #: [id number]           Accession #:    [id number] Date of Birth: 1945/06/14           Patient Gender: F Patient Age:   46 years Exam Location:  [facility] Procedure:      VAS [place] ABI WITH/WO TBI Referring Phys: Jon Billings Leone Putman --------------------------------------------------------------------------------  Indications: Cellulitis. Patient endorses claudication with walking around the              grocery store. High Risk Factors: Hypertension, Diabetes, past history of smoking, coronary                    artery disease.  Comparison Study: Prior ABI done 11/10/2017 Performing Technologist: Sherren Kerns RVS  Examination Guidelines: A complete evaluation includes at minimum, Doppler waveform signals and systolic blood pressure reading at the level of bilateral brachial, anterior tibial, and posterior tibial arteries, when vessel segments are accessible. Bilateral testing is considered an integral part of a complete examination. Photoelectric Plethysmograph (PPG)  waveforms and toe systolic pressure readings are included as required and additional duplex testing as needed. Limited examinations for reoccurring indications may be performed as noted.  ABI Findings: +---------+------------------+-----+-----------+--------+ Right    Rt Pressure (mmHg)IndexWaveform   Comment  +---------+------------------+-----+-----------+--------+ Brachial 146                    multiphasic         +---------+------------------+-----+-----------+--------+ PTA      107               0.71 biphasic            +---------+------------------+-----+-----------+--------+ DP       109               0.72 biphasic            +---------+------------------+-----+-----------+--------+ Great Toe77                0.51                     +---------+------------------+-----+-----------+--------+ +---------+------------------+-----+-----------+-------+ Left     Lt Pressure (mmHg)IndexWaveform   Comment +---------+------------------+-----+-----------+-------+ Brachial 151                    multiphasic        +---------+------------------+-----+-----------+-------+ PTA      99                0.66 biphasic           +---------+------------------+-----+-----------+-------+ DP       106               0.70 biphasic           +---------+------------------+-----+-----------+-------+ Great Toe61                0.40                    +---------+------------------+-----+-----------+-------+ +-------+-----------+-----------+------------+------------+ ABI/TBIToday's ABIToday's TBIPrevious ABIPrevious TBI +-------+-----------+-----------+------------+------------+ Right  0.72       0.51       0.91        0.73         +-------+-----------+-----------+------------+------------+ Left   0.70       0.40       0.86        0.80         +-------+-----------+-----------+------------+------------+  Right and Left ABIs appear decreased compared to prior study  on 11/10/2017. Right and Left TBIs appear decreased compared to prior study on 11/10/2017.  Summary: Right: Resting right ankle-brachial index indicates moderate right lower extremity arterial disease. The right toe-brachial index is abnormal. Left: Resting left ankle-brachial index indicates mild left lower extremity arterial disease. The left toe-brachial index is abnormal.  *See table(s) above for measurements and observations.  Electronically signed by Sherald Hess MD on 11/03/2020 at 2:24:12 PM.    Final    VAS [place] LOWER EXTREMITY VENOUS (DVT) ([facility] and [facility] 7a-7p)  Result Date: 10/30/2020  Lower Venous DVT Study Patient Name:  ABRAR KOONE  Date of Exam:   10/30/2020 Medical Rec #: [id number]           Accession #:    [id number] Date of Birth: 1945/05/25           Patient Gender: F Patient Age:   30 years Exam Location:  [facility] Procedure:      VAS [place] LOWER EXTREMITY VENOUS (DVT) Referring Phys: Leonie Douglas SOTO --------------------------------------------------------------------------------  Comparison Study: Previous exam 02/20/2017 negative Performing Technologist: Jean Rosenthal RDMS, RVT  Examination Guidelines: A complete evaluation includes B-mode imaging, spectral Doppler, color Doppler, and power Doppler as needed of all accessible portions of each vessel. Bilateral testing is considered an integral part of a complete examination. Limited examinations for reoccurring indications may be performed as noted. The reflux portion of the exam is performed with the patient in reverse Trendelenburg.  +-----+---------------+---------+-----------+----------+--------------+ RIGHTCompressibilityPhasicitySpontaneityPropertiesThrombus Aging +-----+---------------+---------+-----------+----------+--------------+ CFV  Full           Yes      Yes                                 +-----+---------------+---------+-----------+----------+--------------+    +---------+---------------+---------+-----------+----------+---------------+ LEFT     CompressibilityPhasicitySpontaneityPropertiesThrombus Aging  +---------+---------------+---------+-----------+----------+---------------+ CFV      Full           Yes      Yes                                  +---------+---------------+---------+-----------+----------+---------------+ SFJ      Full                                                         +---------+---------------+---------+-----------+----------+---------------+ FV Prox  Full                                                         +---------+---------------+---------+-----------+----------+---------------+ FV Mid   Full           Yes      Yes                                  +---------+---------------+---------+-----------+----------+---------------+ FV DistalFull           Yes      Yes                                  +---------+---------------+---------+-----------+----------+---------------+  PFV      Full                                                         +---------+---------------+---------+-----------+----------+---------------+ POP      Full           Yes      Yes                                  +---------+---------------+---------+-----------+----------+---------------+ PTV                                                   patent by color +---------+---------------+---------+-----------+----------+---------------+ PERO                                                  patent by color +---------+---------------+---------+-----------+----------+---------------+     *See table(s) above for measurements and observations. Electronically signed by Coral Else MD on 10/30/2020 at 8:10:50 PM.    Final      Subjective: She is feeling better, breathing well. Report edema has decreased.   Discharge Exam: Vitals:   11/05/20 0827 11/05/20 0900  BP:    Pulse:  91  Resp:    Temp:     SpO2: 92%      General: Pt is alert, awake, not in acute distress Cardiovascular: RRR, S1/S2 +, no rubs, no gallops Respiratory: CTA bilaterally, no wheezing, no rhonchi Abdominal: Soft, NT, ND, bowel sounds + Extremities: redness and edema improved left leg    The results of significant diagnostics from this hospitalization (including imaging, microbiology, ancillary and laboratory) are listed below for reference.     Microbiology: Recent Results (from the past 240 hour(s))  SARS CORONAVIRUS 2 (TAT 6-24 HRS) Nasopharyngeal Nasopharyngeal Swab     Status: None   Collection Time: 10/31/20  6:34 PM   Specimen: Nasopharyngeal Swab  Result Value Ref Range Status   SARS Coronavirus 2 NEGATIVE NEGATIVE Final    Comment: (NOTE) SARS-CoV-2 target nucleic acids are NOT DETECTED.  The SARS-CoV-2 RNA is generally detectable in upper and lower respiratory specimens during the acute phase of infection. Negative results do not preclude SARS-CoV-2 infection, do not rule out co-infections with other pathogens, and should not be used as the sole basis for treatment or other patient management decisions. Negative results must be combined with clinical observations, patient history, and epidemiological information. The expected result is Negative.  Fact Sheet for Patients: HairSlick.no  Fact Sheet for Healthcare Providers: quierodirigir.com  This test is not yet approved or cleared by the [place] FDA and  has been authorized for detection and/or diagnosis of SARS-CoV-2 by FDA under an Emergency Use Authorization (EUA). This EUA will remain  in effect (meaning this test can be used) for the duration of the COVID-19 declaration under Se ction 564(b)(1) of the Act, 21 U.S.C. section 360bbb-3(b)(1), unless the authorization is terminated or revoked sooner.  Performed at [facility], [address],  [place],  [place] [address]   Blood culture (routine x 2)     Status: None   Collection Time: 10/31/20  6:47 PM   Specimen: BLOOD  Result Value Ref Range Status   Specimen Description BLOOD SITE NOT SPECIFIED  Final   Special Requests   Final    BOTTLES DRAWN AEROBIC AND ANAEROBIC Blood Culture results may not be optimal due to an inadequate volume of blood received in culture bottles   Culture   Final    NO GROWTH 5 DAYS Performed at [facility], [address], [place], [place] [address]    Report Status 11/05/2020 FINAL  Final  Blood culture (routine x 2)     Status: None   Collection Time: 10/31/20  9:35 PM   Specimen: BLOOD RIGHT HAND  Result Value Ref Range Status   Specimen Description BLOOD RIGHT HAND  Final   Special Requests   Final    BOTTLES DRAWN AEROBIC AND ANAEROBIC Blood Culture results may not be optimal due to an inadequate volume of blood received in culture bottles   Culture   Final    NO GROWTH 5 DAYS Performed at [facility], [address] [place], [place] [address]    Report Status 11/05/2020 FINAL  Final     Labs: BNP (last 3 results) No results for input(s): BNP in the last 8760 hours. Basic Metabolic Panel: Recent Labs  Lab 10/31/20 1846 11/01/20 0253 11/02/20 1039 11/03/20 0747 11/04/20 0308  NA 127* 129* 132* 132* 133*  K 4.2 4.0 4.1 4.5 4.2  CL 95* 97* 102 102 96*  CO2 GLUCOSE 138* 151* 146* 139* 118*  BUN 36* 32* CREATININE 1.71* 1.49* 1.41* 1.21* 1.27*  CALCIUM 8.7* 8.6* 8.5* 8.7* 9.0   Liver Function Tests: Recent Labs  Lab 10/30/20 1426 11/01/20 0253  AST 35 20  ALT 16 15  ALKPHOS 80 74  BILITOT 0.9 1.0  PROT 6.3* 5.7*  ALBUMIN 3.3* 3.0*   No results for input(s): LIPASE, AMYLASE in the last 168 hours. No results for input(s): AMMONIA in the last 168 hours. CBC: Recent Labs  Lab 10/30/20 1426 10/31/20 1846 11/01/20 0253 11/02/20 1039 11/03/20 0747 11/04/20 0308  WBC 20.1* 15.0* 12.6* 13.0*  12.8* 12.2*  NEUTROABS 17.8* 12.9*  --   --   --   --   HGB 10.5* 9.7* 9.6* 8.6* 8.7* 9.1*  HCT 32.5* 29.1* 28.2* 26.6* 26.6* 27.9*  MCV 105.2* 102.1* 100.0 103.5* 102.7* 103.3*  PLT 248 249 248 254 291 388   Cardiac Enzymes: No results for input(s): CKTOTAL, CKMB, CKMBINDEX, TROPONINI in the last 168 hours. BNP: Invalid input(s): POCBNP CBG: Recent Labs  Lab 11/04/20 1101 11/04/20 1616 11/04/20 2044 11/05/20 0638 11/05/20 1110  GLUCAP 141* 147* 127* 143* 142*   D-Dimer No results for input(s): DDIMER in the last 72 hours. Hgb A1c No results for input(s): HGBA1C in the last 72 hours. Lipid Profile No results for input(s): CHOL, HDL, LDLCALC, TRIG, CHOLHDL, LDLDIRECT in the last 72 hours. Thyroid function studies No results for input(s): TSH, T4TOTAL, T3FREE, THYROIDAB in the last 72 hours.  Invalid input(s): FREET3 Anemia work up No results for input(s): VITAMINB12, FOLATE, FERRITIN, TIBC, IRON, RETICCTPCT in the last 72 hours. Urinalysis    Component Value Date/Time   COLORURINE STRAW (A) 12/22/2017 1449   APPEARANCEUR CLEAR 12/22/2017 1449   LABSPEC 1.010 12/22/2017 1449   PHURINE  5.0 12/22/2017 1449   GLUCOSEU NEGATIVE 12/22/2017 1449   HGBUR NEGATIVE 12/22/2017 1449   BILIRUBINUR NEGATIVE 12/22/2017 1449   KETONESUR NEGATIVE 12/22/2017 1449   PROTEINUR NEGATIVE 12/22/2017 1449   UROBILINOGEN 0.2 07/20/2014 0732   NITRITE NEGATIVE 12/22/2017 1449   LEUKOCYTESUR NEGATIVE 12/22/2017 1449   Sepsis Labs Invalid input(s): PROCALCITONIN,  WBC,  LACTICIDVEN Microbiology Recent Results (from the past 240 hour(s))  SARS CORONAVIRUS 2 (TAT 6-24 HRS) Nasopharyngeal Nasopharyngeal Swab     Status: None   Collection Time: 10/31/20  6:34 PM   Specimen: Nasopharyngeal Swab  Result Value Ref Range Status   SARS Coronavirus 2 NEGATIVE NEGATIVE Final    Comment: (NOTE) SARS-CoV-2 target nucleic acids are NOT DETECTED.  The SARS-CoV-2 RNA is generally detectable in upper  and lower respiratory specimens during the acute phase of infection. Negative results do not preclude SARS-CoV-2 infection, do not rule out co-infections with other pathogens, and should not be used as the sole basis for treatment or other patient management decisions. Negative results must be combined with clinical observations, patient history, and epidemiological information. The expected result is Negative.  Fact Sheet for Patients: HairSlick.no  Fact Sheet for Healthcare Providers: quierodirigir.com  This test is not yet approved or cleared by the [place] FDA and  has been authorized for detection and/or diagnosis of SARS-CoV-2 by FDA under an Emergency Use Authorization (EUA). This EUA will remain  in effect (meaning this test can be used) for the duration of the COVID-19 declaration under Se ction 564(b)(1) of the Act, 21 U.S.C. section 360bbb-3(b)(1), unless the authorization is terminated or revoked sooner.  Performed at [facility], [address], [place], [place] [address]   Blood culture (routine x 2)     Status: None   Collection Time: 10/31/20  6:47 PM   Specimen: BLOOD  Result Value Ref Range Status   Specimen Description BLOOD SITE NOT SPECIFIED  Final   Special Requests   Final    BOTTLES DRAWN AEROBIC AND ANAEROBIC Blood Culture results may not be optimal due to an inadequate volume of blood received in culture bottles   Culture   Final    NO GROWTH 5 DAYS Performed at [facility], [address] [place], [place] [address]    Report Status 11/05/2020 FINAL  Final  Blood culture (routine x 2)     Status: None   Collection Time: 10/31/20  9:35 PM   Specimen: BLOOD RIGHT HAND  Result Value Ref Range Status   Specimen Description BLOOD RIGHT HAND  Final   Special Requests   Final    BOTTLES DRAWN AEROBIC AND ANAEROBIC Blood Culture results may not be optimal due to an inadequate volume  of blood received in culture bottles   Culture   Final    NO GROWTH 5 DAYS Performed at [facility], [address], [place], [place] [address]    Report Status 11/05/2020 FINAL  Final     Time coordinating discharge: 40 minutes  SIGNED:   Alba Cory, MD  [facility]

## 2020-11-13 ENCOUNTER — Other Ambulatory Visit: Payer: Self-pay

## 2020-11-13 ENCOUNTER — Encounter: Payer: Self-pay | Admitting: Internal Medicine

## 2020-11-13 ENCOUNTER — Inpatient Hospital Stay: Payer: Medicare Other | Admitting: Internal Medicine

## 2020-11-13 ENCOUNTER — Ambulatory Visit (INDEPENDENT_AMBULATORY_CARE_PROVIDER_SITE_OTHER): Payer: Medicare Other | Admitting: Internal Medicine

## 2020-11-13 VITALS — BP 154/76 | HR 87 | Wt 209.0 lb

## 2020-11-13 DIAGNOSIS — R600 Localized edema: Secondary | ICD-10-CM

## 2020-11-13 DIAGNOSIS — L03116 Cellulitis of left lower limb: Secondary | ICD-10-CM | POA: Diagnosis not present

## 2020-11-13 NOTE — Assessment & Plan Note (Signed)
Has been an issue since an ankle fracture 10 years ago.  Discussed keeping the leg elevated when possible.

## 2020-11-13 NOTE — Progress Notes (Signed)
   Subjective:    Patient ID: Marilyn Sexton, female    DOB: 03/18/1945, 75 y.o.   MRN: [id number]  HPI Here for hsfu She presented to the hospital with erythema and swelling of the left leg with concern for cellulitis.  She was started on vancomycin and cefepime and ID was consulted in the hospital.  I saw her and the cellulitis was improving and I changed her to more appropriate therapy with cefazolin and then cephalexin at discharge which she completed.  She continues to leave the leg up when sitting or lying.  She is having no associated rash or diarrhea.     Review of Systems  Constitutional:  Negative for chills and fever.  Gastrointestinal:  Negative for diarrhea and nausea.  Skin:  Negative for rash.      Objective:   Physical Exam Eyes:     General: No scleral icterus. Musculoskeletal:     Comments: Left leg with edema, skin changes around medial malleolus with petechiae looking area.  No warmth, no tenderness.   Skin:    Findings: No rash.  Neurological:     Mental Status: She is alert.          Assessment & Plan:

## 2020-11-13 NOTE — Assessment & Plan Note (Signed)
Resolved cellulitis.  No concerns now and no further antibiotics indicated.   rtc prn

## 2020-11-19 ENCOUNTER — Encounter: Payer: Medicare Other | Admitting: Vascular Surgery

## 2020-12-03 ENCOUNTER — Other Ambulatory Visit: Payer: Self-pay

## 2020-12-03 ENCOUNTER — Emergency Department (HOSPITAL_COMMUNITY): Payer: Medicare Other

## 2020-12-03 ENCOUNTER — Emergency Department (HOSPITAL_COMMUNITY)
Admission: EM | Admit: 2020-12-03 | Discharge: 2020-12-03 | Disposition: A | Discharge status: Home / Self Care | Payer: Medicare Other | Attending: Emergency Medicine | Admitting: Emergency Medicine

## 2020-12-03 DIAGNOSIS — D649 Anemia, unspecified: Secondary | ICD-10-CM | POA: Diagnosis not present

## 2020-12-03 DIAGNOSIS — K5900 Constipation, unspecified: Secondary | ICD-10-CM | POA: Diagnosis not present

## 2020-12-03 DIAGNOSIS — Z7982 Long term (current) use of aspirin: Secondary | ICD-10-CM | POA: Insufficient documentation

## 2020-12-03 DIAGNOSIS — Z79899 Other long term (current) drug therapy: Secondary | ICD-10-CM | POA: Diagnosis not present

## 2020-12-03 DIAGNOSIS — I10 Essential (primary) hypertension: Secondary | ICD-10-CM | POA: Insufficient documentation

## 2020-12-03 DIAGNOSIS — E119 Type 2 diabetes mellitus without complications: Secondary | ICD-10-CM | POA: Diagnosis not present

## 2020-12-03 DIAGNOSIS — Z87891 Personal history of nicotine dependence: Secondary | ICD-10-CM | POA: Diagnosis not present

## 2020-12-03 DIAGNOSIS — Z96651 Presence of right artificial knee joint: Secondary | ICD-10-CM | POA: Diagnosis not present

## 2020-12-03 DIAGNOSIS — R1084 Generalized abdominal pain: Secondary | ICD-10-CM | POA: Diagnosis present

## 2020-12-03 DIAGNOSIS — Z794 Long term (current) use of insulin: Secondary | ICD-10-CM | POA: Insufficient documentation

## 2020-12-03 DIAGNOSIS — R319 Hematuria, unspecified: Secondary | ICD-10-CM | POA: Insufficient documentation

## 2020-12-03 LAB — COMPREHENSIVE METABOLIC PANEL
ALT: 8 U/L (ref 0–44)
AST: 13 U/L — ABNORMAL LOW (ref 15–41)
Albumin: 3.6 g/dL (ref 3.5–5.0)
Alkaline Phosphatase: 80 U/L (ref 38–126)
Anion gap: 9 (ref 5–15)
BUN: 17 mg/dL (ref 8–23)
CO2: 24 mmol/L (ref 22–32)
Calcium: 9 mg/dL (ref 8.9–10.3)
Chloride: 99 mmol/L (ref 98–111)
Creatinine, Ser: 1.56 mg/dL — ABNORMAL HIGH (ref 0.44–1.00)
GFR, Estimated: 35 mL/min — ABNORMAL LOW (ref >60)
Glucose, Bld: 126 mg/dL — ABNORMAL HIGH (ref 70–99)
Potassium: 4.5 mmol/L (ref 3.5–5.1)
Sodium: 132 mmol/L — ABNORMAL LOW (ref 135–145)
Total Bilirubin: 1 mg/dL (ref 0.3–1.2)
Total Protein: 6.5 g/dL (ref 6.5–8.1)

## 2020-12-03 LAB — CBC WITH DIFFERENTIAL/PLATELET
Abs Immature Granulocytes: 0.02 10*3/uL (ref 0.00–0.07)
Basophils Absolute: 0 10*3/uL (ref 0.0–0.1)
Basophils Relative: 0 %
Eosinophils Absolute: 0.1 10*3/uL (ref 0.0–0.5)
Eosinophils Relative: 1 %
HCT: 29.3 % — ABNORMAL LOW (ref 36.0–46.0)
Hemoglobin: 9.4 g/dL — ABNORMAL LOW (ref 12.0–15.0)
Immature Granulocytes: 0 %
Lymphocytes Relative: 27 %
Lymphs Abs: 2.2 10*3/uL (ref 0.7–4.0)
MCH: 33.3 pg (ref 26.0–34.0)
MCHC: 32.1 g/dL (ref 30.0–36.0)
MCV: 103.9 fL — ABNORMAL HIGH (ref 80.0–100.0)
Monocytes Absolute: 1.2 10*3/uL — ABNORMAL HIGH (ref 0.1–1.0)
Monocytes Relative: 15 %
Neutro Abs: 4.5 10*3/uL (ref 1.7–7.7)
Neutrophils Relative %: 57 %
Platelets: 297 10*3/uL (ref 150–400)
RBC: 2.82 MIL/uL — ABNORMAL LOW (ref 3.87–5.11)
RDW: 17.3 % — ABNORMAL HIGH (ref 11.5–15.5)
WBC: 8 10*3/uL (ref 4.0–10.5)
nRBC: 0 % (ref 0.0–0.2)

## 2020-12-03 LAB — URINALYSIS, ROUTINE W REFLEX MICROSCOPIC
Bilirubin Urine: NEGATIVE
Glucose, UA: NEGATIVE mg/dL
Hgb urine dipstick: NEGATIVE
Ketones, ur: NEGATIVE mg/dL
Leukocytes,Ua: NEGATIVE
Nitrite: NEGATIVE
Protein, ur: NEGATIVE mg/dL
Specific Gravity, Urine: 1.01 (ref 1.005–1.030)
pH: 5 (ref 5.0–8.0)

## 2020-12-03 MED ORDER — IOHEXOL 350 MG/ML SOLN
75.0000 mL | Freq: Once | INTRAVENOUS | Status: AC | PRN
  Administered 2020-12-03: 75 mL via INTRAVENOUS

## 2020-12-03 MED ORDER — ONDANSETRON HCL 4 MG/2ML IJ SOLN
4.0000 mg | Freq: Once | INTRAMUSCULAR | Status: AC
  Administered 2020-12-03: 4 mg via INTRAVENOUS
  Filled 2020-12-03: qty 2

## 2020-12-03 NOTE — ED Provider Notes (Signed)
[facility] EMERGENCY DEPARTMENT Provider Note  CSN: [id number] Arrival date & time: 12/03/20 1356    History Chief Complaint  Patient presents with  . Constipation    Marilyn Sexton is a 75 y.o. female with h/o DM, HTN reports she has not been able to have a BM in about a week. She has been trying suppositories and Miralax with minimal results. She reports some intermittent abdominal pains, no vomiting or fever. She was recently admitted for a LE cellulitis and AKI, also treated for UTI by PCP about a week ago otherwise no changes in meds. She reports she has still had some blood in her urine and is being referred to Urology.    Past Medical History:  Diagnosis Date  . Diabetes mellitus without complication (HCC)   . Dyspnea   . Hypertension   . Myocardial infarction (HCC)   . Thyroid disease     Past Surgical History:  Procedure Laterality Date  . CARDIAC CATHETERIZATION    . CHOLECYSTECTOMY    . coronary stents    . EYE SURGERY     bilateral cataracts  . KNEE ARTHROSCOPY Right 12/16/2016   Procedure: RIGHT KNEE ARTHROSCOPY AND DEBRIDEMENT;  Surgeon: Nadara Mustard, MD;  Location: [facility] OR;  Service: Orthopedics;  Laterality: Right;  . TOTAL KNEE ARTHROPLASTY Right 12/31/2017   Procedure: RIGHT TOTAL KNEE ARTHROPLASTY;  Surgeon: Jodi Geralds, MD;  Location: [facility] ORS;  Service: Orthopedics;  Laterality: Right;  Adductor Block  . TUBAL LIGATION      No family history on file.  Social History   Tobacco Use  . Smoking status: Former    Packs/day: 1.00    Years: 44.00    Pack years: 44.00    Types: Cigarettes    Quit date: 2006    Years since quitting: 16.7  . Smokeless tobacco: Never  Vaping Use  . Vaping Use: Never used  Substance Use Topics  . Alcohol use: No  . Drug use: No     Home Medications Prior to Admission medications   Medication Sig Start Date End Date Taking? Authorizing Provider  amLODipine (NORVASC) 5 MG tablet Take 5 mg by mouth daily.     [provider]  aspirin EC 81 MG tablet Take 81 mg by mouth daily. Swallow whole.    [provider]  benztropine (COGENTIN) 0.5 MG tablet Take 1 tablet (0.5 mg total) by mouth 2 (two) times daily. 07/23/14   Charm Rings, NP  docusate sodium (COLACE) 100 MG capsule Take 1 capsule (100 mg total) by mouth 2 (two) times daily. 12/31/17   Marshia Ly, PA-C  Doxepin HCl 3 MG TABS Take 1 tablet by mouth at bedtime. 09/13/20   [provider]  furosemide (LASIX) 20 MG tablet Take 20 mg by mouth daily.    [provider]  insulin lispro protamine-lispro (HUMALOG MIX 75/25) (75-25) 100 UNIT/ML SUSP injection Inject 20 Units into the skin in the morning and at bedtime.    [provider]  Ipratropium-Albuterol (COMBIVENT) 20-100 MCG/ACT AERS respimat Inhale 1 puff into the lungs every 6 (six) hours. 11/05/20   Regalado, Belkys A, MD  metoprolol tartrate (LOPRESSOR) 50 MG tablet Take 50 mg by mouth 2 (two) times daily.    [provider]  mirabegron ER (MYRBETRIQ) 50 MG TB24 tablet Take 50 mg by mouth daily.     [provider]  nitroGLYCERIN (NITROSTAT) 0.4 MG SL tablet Place 1 tablet (0.4 mg total)  under the tongue every 5 (five) minutes x 3 doses as needed for chest pain. 08/16/17   Rinaldo Cloud, MD  Propylene Glycol 0.6 % SOLN Place 2 drops into both eyes daily as needed (for dry eyes).     [provider]  risperiDONE (RISPERDAL) 1 MG tablet Take 1 mg by mouth 2 (two) times daily.    [provider]  vitamin B-12 1000 MCG tablet Take 1 tablet (1,000 mcg total) by mouth daily. 11/06/20   Regalado, Prentiss Bells, MD     Allergies    Patient has no known allergies.   Review of Systems   Review of Systems A comprehensive review of systems was completed and negative except as noted in HPI.    Physical Exam BP (!) 148/53   Pulse 86   Temp 98.5 F (36.9 C) (Oral)   Resp 18   SpO2 97%   Physical Exam Vitals and  nursing note reviewed.  Constitutional:      Appearance: Normal appearance.  HENT:     Head: Normocephalic and atraumatic.     Nose: Nose normal.     Mouth/Throat:     Mouth: Mucous membranes are moist.  Eyes:     Extraocular Movements: Extraocular movements intact.     Conjunctiva/sclera: Conjunctivae normal.  Cardiovascular:     Rate and Rhythm: Normal rate.  Pulmonary:     Effort: Pulmonary effort is normal.     Breath sounds: Normal breath sounds.  Abdominal:     General: Abdomen is flat.     Palpations: Abdomen is soft.     Tenderness: There is abdominal tenderness (mild diffuse). There is no guarding.  Genitourinary:    Comments: Chaperone present, normal external, no mass, tenderness or fecal impaction Musculoskeletal:        General: No swelling. Normal range of motion.     Cervical back: Neck supple.  Skin:    General: Skin is warm and dry.  Neurological:     General: No focal deficit present.     Mental Status: She is alert.  Psychiatric:        Mood and Affect: Mood normal.     ED Results / Procedures / Treatments   Labs (all labs ordered are listed, but only abnormal results are displayed) Labs Reviewed  COMPREHENSIVE METABOLIC PANEL  CBC WITH DIFFERENTIAL/PLATELET  URINALYSIS, ROUTINE W REFLEX MICROSCOPIC    EKG None  Radiology DG Abdomen Acute W/Chest  Result Date: 12/03/2020 CLINICAL DATA:  Constipation. X 1 week. Abdominal pain. Frequent belching and passing gas. EXAM: DG ABDOMEN ACUTE WITH 1 VIEW CHEST COMPARISON:  None. FINDINGS: The heart and mediastinal contours are within normal limits. Aortic calcification. No focal consolidation. No pulmonary edema. No pleural effusion. No pneumothorax. There is no evidence of dilated bowel loops or free intraperitoneal air. No radiopaque calculi or other significant radiographic abnormality is seen. No acute osseous abnormality. IMPRESSION: Negative abdominal radiographs.  No acute cardiopulmonary disease.  Electronically Signed   By: Tish Frederickson M.D.   On: 12/03/2020 17:21    Procedures Procedures  Medications Ordered in the ED Medications - No data to display   MDM Rules/Calculators/A&P MDM Patient with constipation despite OTC bowel regimen also having intermittent abdominal pain. Given no impaction on exam, will check labs and send for CT to eval other causes of her pain. Check UA to eval recent UTI.   ED Course  I have reviewed the triage vital signs and the nursing notes.  Pertinent labs & imaging results that were available during my care of the patient were reviewed by me and considered in my medical decision making (see chart for details).  Clinical Course as of 12/04/20 1029  Tue Dec 03, 2020  2117 CBC with anemia at baseline. Normal WBC.  [CS]  2140 CMP with CKD about at baseline.  [CS]  2246 UA neg for infection or blood.  [CS]  2259 CT neg for acute process including scant stool in colon. Patient ready to go home, recommend she follow up with PCP and RTED for any other concerns [CS]    Clinical Course User Index [CS] Pollyann Savoy, MD    Final Clinical Impression(s) / ED Diagnoses Final diagnoses:  None    Rx / DC Orders ED Discharge Orders     None        Pollyann Savoy, MD 12/04/20 1029

## 2020-12-03 NOTE — ED Notes (Signed)
Pt discharged and wheeled out of the ED in a wheel chair without difficulty. 

## 2020-12-03 NOTE — ED Provider Notes (Signed)
Emergency Medicine Provider Triage Evaluation Note  Marilyn Sexton , a 75 y.o. female  was evaluated in triage.  Pt complains of constipation.  Patient states that she has not had a bowel movement for 7 days.  This is not normal for her.  She usually has a bowel movement every 2 to 3 days.  She has been taking daily MiraLAX.  She has tried 2 enemas with no relief.  She denies any nausea or vomiting.  She has not passed any gas since yesterday. but she does have increased indigestion and burping.  She denies any abdominal pain, chest pain, shortness of breath.  Review of Systems  Positive: Constipation, indigestion Negative: Abdominal pain, chest pain, shortness of breath, nausea, vomiting  Physical Exam  BP (!) 177/68   Pulse 87   Temp 98.5 F (36.9 C) (Oral)   Resp 16   SpO2 95%  Gen:   Awake, no distress Resp:  Normal effort MSK:   Moves extremities without difficulty  Other:  Abdomen is soft but distended.  Bowel sounds present.  Patient notable to have audible burping on exam.  No abdominal tenderness to palpation  Medical Decision Making  Medically screening exam initiated at 4:01 PM.  Appropriate orders placed.  [name] was informed that the remainder of the evaluation will be completed by another provider, this initial triage assessment does not replace that evaluation, and the importance of remaining in the ED until their evaluation is complete.    Claudie Leach, PA-C 12/03/20 1603    [name], DO 12/03/20 2150

## 2020-12-03 NOTE — ED Triage Notes (Signed)
Pt reports constipation x 1 week. Tried suppositories and had a small bowel movement, but other than that no bowel movement in 7 days. Taking miralax without relief. Endorses abdominal pain, frequent belching and is passing gas.

## 2020-12-10 ENCOUNTER — Other Ambulatory Visit: Payer: Self-pay

## 2020-12-10 ENCOUNTER — Ambulatory Visit: Payer: Medicare Other | Admitting: Vascular Surgery

## 2020-12-10 ENCOUNTER — Encounter: Payer: Self-pay | Admitting: Vascular Surgery

## 2020-12-10 DIAGNOSIS — I739 Peripheral vascular disease, unspecified: Secondary | ICD-10-CM | POA: Diagnosis not present

## 2020-12-10 NOTE — Progress Notes (Signed)
Patient name: Marilyn Sexton MRN: [id number] DOB: [birth year] Sex: female  REASON FOR VISIT: Hospital follow-up left leg cellulitis  HPI: Marilyn Sexton is a 75 y.o. female with history of diabetes, hypertension, coronary artery disease that presents for hospital follow-up for left leg cellulitis.  She was admitted for IV antibiotics after she had some trauma to the leg from foreign body when she was helping her husband move sticks in the woods.  This has since healed.  She did get noninvasive imaging in the hospital and vascular surgery was consulted.  At the time she did endorse pain particularly in the left calf when walking.  She feels this is worse and states she cannot go to the grocery store.  States she cannot walk to the front desk of our office.  States she cannot walk down the pier at [employer] before her left leg gives out.  Past Medical History:  Diagnosis Date   Diabetes mellitus without complication (HCC)    Dyspnea    Hypertension    Myocardial infarction [facility])    Thyroid disease     Past Surgical History:  Procedure Laterality Date   CARDIAC CATHETERIZATION     CHOLECYSTECTOMY     coronary stents     EYE SURGERY     bilateral cataracts   KNEE ARTHROSCOPY Right 12/16/2016   Procedure: RIGHT KNEE ARTHROSCOPY AND DEBRIDEMENT;  Surgeon: Nadara Mustard, MD;  Location: [facility] OR;  Service: Orthopedics;  Laterality: Right;   TOTAL KNEE ARTHROPLASTY Right 12/31/2017   Procedure: RIGHT TOTAL KNEE ARTHROPLASTY;  Surgeon: Jodi Geralds, MD;  Location: [facility] ORS;  Service: Orthopedics;  Laterality: Right;  Adductor Block   TUBAL LIGATION      History reviewed. No pertinent family history.  SOCIAL HISTORY: Social History   Tobacco Use   Smoking status: Former    Packs/day: 1.00    Years: 44.00    Pack years: 44.00    Types: Cigarettes    Quit date: 2006    Years since quitting: 16.7   Smokeless tobacco: Never  Substance Use Topics   Alcohol use: No    No Known  Allergies  Current Outpatient Medications  Medication Sig Dispense Refill   amLODipine (NORVASC) 5 MG tablet Take 5 mg by mouth daily.     aspirin EC 81 MG tablet Take 81 mg by mouth daily. Swallow whole.     benztropine (COGENTIN) 0.5 MG tablet Take 1 tablet (0.5 mg total) by mouth 2 (two) times daily. 30 tablet 0   docusate sodium (COLACE) 100 MG capsule Take 1 capsule (100 mg total) by mouth 2 (two) times daily. 30 capsule 0   Doxepin HCl 3 MG TABS Take 1 tablet by mouth at bedtime.     furosemide (LASIX) 20 MG tablet Take 20 mg by mouth daily.     insulin lispro protamine-lispro (HUMALOG MIX 75/25) (75-25) 100 UNIT/ML SUSP injection Inject 20 Units into the skin in the morning and at bedtime.     Ipratropium-Albuterol (COMBIVENT) 20-100 MCG/ACT AERS respimat Inhale 1 puff into the lungs every 6 (six) hours. 1 each 2   metoprolol tartrate (LOPRESSOR) 50 MG tablet Take 50 mg by mouth 2 (two) times daily.     mirabegron ER (MYRBETRIQ) 50 MG TB24 tablet Take 50 mg by mouth daily.      Propylene Glycol 0.6 % SOLN Place 2 drops into both eyes daily as needed (for dry eyes).      risperiDONE (RISPERDAL)  1 MG tablet Take 1 mg by mouth 2 (two) times daily.     vitamin B-12 1000 MCG tablet Take 1 tablet (1,000 mcg total) by mouth daily. 20 tablet 0   nitroGLYCERIN (NITROSTAT) 0.4 MG SL tablet Place 1 tablet (0.4 mg total) under the tongue every 5 (five) minutes x 3 doses as needed for chest pain. (Patient not taking: Reported on 12/10/2020) 25 tablet 12   No current facility-administered medications for this visit.    REVIEW OF SYSTEMS:  [X]  denotes positive finding, [ ]  denotes negative finding Cardiac  Comments:  Chest pain or chest pressure:    Shortness of breath upon exertion:    Short of breath when lying flat:    Irregular heart rhythm:        Vascular    Pain in calf, thigh, or hip brought on by ambulation: x Left  Pain in feet at night that wakes you up from your sleep:     Blood  clot in your veins:    Leg swelling:         Pulmonary    Oxygen at home:    Productive cough:     Wheezing:         Neurologic    Sudden weakness in arms or legs:     Sudden numbness in arms or legs:     Sudden onset of difficulty speaking or slurred speech:    Temporary loss of vision in one eye:     Problems with dizziness:         Gastrointestinal    Blood in stool:     Vomited blood:         Genitourinary    Burning when urinating:     Blood in urine:        Psychiatric    Major depression:         Hematologic    Bleeding problems:    Problems with blood clotting too easily:        Skin    Rashes or ulcers:        Constitutional    Fever or chills:      PHYSICAL EXAM: Vitals:   12/10/20 1157  BP: (!) 144/53  Pulse: 74  Resp: 14  Temp: (!) 97.3 F (36.3 C)  TempSrc: Temporal  SpO2: 95%  Weight: 199 lb (90.3 kg)  Height: 5\' 4"  (1.626 m)    GENERAL: The patient is a well-nourished female, in no acute distress. The vital signs are documented above. CARDIAC: There is a regular rate and rhythm.  VASCULAR:  Palpable femoral pulses bilaterally Right PT palpable PULMONARY: No respiratory distress ABDOMEN: Soft and non-tender MUSCULOSKELETAL: There are no major deformities or cyanosis. NEUROLOGIC: No focal weakness or paresthesias are detected. SKIN: There are no ulcers or rashes noted. PSYCHIATRIC: The patient has a normal affect.  DATA:   ABIs 11/03/20 0.72 on the right and 0.7 on the left  Assessment/Plan:   75 year old female presents for hospital follow-up regarding cellulitis of her left lower extremity.  As previously discussed in the hospital I did not feel that peripheral arterial insufficiency was the etiology for her cellulitis and this has since healed with IV antibiotics and likely related to a foreign body/trauma.  She does endorse left leg short distance claudication.  This sounds fairly debilitating given she states she can no longer go  to the grocery store or walk to the front of our office without having to stop.  States she gets pain in the left calf.  Discussed claudication is not limb threatening.  Discussed we should reserve intervention for only when she feels she cannot tolerate her symptoms.  She wants to proceed with intervention I have offered her aortogram, left leg arteriogram, and possible intervention.  We will get her scheduled for next week.  Risk benefits discussed.   Cephus Shelling, MD [facility] Office: [phone]

## 2020-12-19 ENCOUNTER — Ambulatory Visit (HOSPITAL_COMMUNITY)
Admission: RE | Admit: 2020-12-19 | Discharge: 2020-12-19 | Disposition: A | Discharge status: Home / Self Care | Payer: Medicare Other | Attending: Vascular Surgery | Admitting: Vascular Surgery

## 2020-12-19 ENCOUNTER — Encounter (HOSPITAL_COMMUNITY)
Admission: RE | Disposition: A | Discharge status: Home / Self Care | Payer: Self-pay | Source: Home / Self Care | Attending: Vascular Surgery

## 2020-12-19 ENCOUNTER — Other Ambulatory Visit: Payer: Self-pay

## 2020-12-19 DIAGNOSIS — Z7982 Long term (current) use of aspirin: Secondary | ICD-10-CM | POA: Insufficient documentation

## 2020-12-19 DIAGNOSIS — L03116 Cellulitis of left lower limb: Secondary | ICD-10-CM

## 2020-12-19 DIAGNOSIS — I10 Essential (primary) hypertension: Secondary | ICD-10-CM | POA: Insufficient documentation

## 2020-12-19 DIAGNOSIS — I70212 Atherosclerosis of native arteries of extremities with intermittent claudication, left leg: Secondary | ICD-10-CM | POA: Insufficient documentation

## 2020-12-19 DIAGNOSIS — E1151 Type 2 diabetes mellitus with diabetic peripheral angiopathy without gangrene: Secondary | ICD-10-CM | POA: Insufficient documentation

## 2020-12-19 DIAGNOSIS — Z794 Long term (current) use of insulin: Secondary | ICD-10-CM | POA: Insufficient documentation

## 2020-12-19 DIAGNOSIS — Z87891 Personal history of nicotine dependence: Secondary | ICD-10-CM | POA: Diagnosis not present

## 2020-12-19 DIAGNOSIS — M79605 Pain in left leg: Secondary | ICD-10-CM

## 2020-12-19 DIAGNOSIS — I2510 Atherosclerotic heart disease of native coronary artery without angina pectoris: Secondary | ICD-10-CM | POA: Diagnosis not present

## 2020-12-19 DIAGNOSIS — Z79899 Other long term (current) drug therapy: Secondary | ICD-10-CM | POA: Insufficient documentation

## 2020-12-19 HISTORY — PX: ABDOMINAL AORTOGRAM W/LOWER EXTREMITY: CATH118223

## 2020-12-19 LAB — GLUCOSE, CAPILLARY
Glucose-Capillary: 109 mg/dL — ABNORMAL HIGH (ref 70–99)
Glucose-Capillary: 92 mg/dL (ref 70–99)

## 2020-12-19 SURGERY — ABDOMINAL AORTOGRAM W/LOWER EXTREMITY
Anesthesia: LOCAL

## 2020-12-19 MED ORDER — MIDAZOLAM HCL 2 MG/2ML IJ SOLN
INTRAMUSCULAR | Status: AC
  Filled 2020-12-19: qty 2

## 2020-12-19 MED ORDER — SODIUM CHLORIDE 0.9% FLUSH: 3.0000 mL | Freq: Two times a day (BID) | INTRAVENOUS | Status: DC

## 2020-12-19 MED ORDER — HEPARIN (PORCINE) IN NACL 1000-0.9 UT/500ML-% IV SOLN
INTRAVENOUS | Status: AC
  Filled 2020-12-19: qty 1000

## 2020-12-19 MED ORDER — LIDOCAINE HCL (PF) 1 % IJ SOLN
INTRAMUSCULAR | Status: DC | PRN
  Administered 2020-12-19: 15 mL via INTRADERMAL

## 2020-12-19 MED ORDER — SODIUM CHLORIDE 0.9 % IV SOLN: INTRAVENOUS | Status: DC

## 2020-12-19 MED ORDER — LABETALOL HCL 5 MG/ML IV SOLN: 10.0000 mg | INTRAVENOUS | Status: DC | PRN

## 2020-12-19 MED ORDER — SODIUM CHLORIDE 0.9% FLUSH: 3.0000 mL | INTRAVENOUS | Status: DC | PRN

## 2020-12-19 MED ORDER — ACETAMINOPHEN 325 MG PO TABS: 650.0000 mg | ORAL_TABLET | ORAL | Status: DC | PRN

## 2020-12-19 MED ORDER — ONDANSETRON HCL 4 MG/2ML IJ SOLN: 4.0000 mg | Freq: Four times a day (QID) | INTRAMUSCULAR | Status: DC | PRN

## 2020-12-19 MED ORDER — LIDOCAINE HCL (PF) 1 % IJ SOLN
INTRAMUSCULAR | Status: AC
  Filled 2020-12-19: qty 30

## 2020-12-19 MED ORDER — HEPARIN (PORCINE) IN NACL 1000-0.9 UT/500ML-% IV SOLN
INTRAVENOUS | Status: DC | PRN
  Administered 2020-12-19 (×2): 500 mL

## 2020-12-19 MED ORDER — FENTANYL CITRATE (PF) 100 MCG/2ML IJ SOLN
INTRAMUSCULAR | Status: AC
  Filled 2020-12-19: qty 2

## 2020-12-19 MED ORDER — SODIUM CHLORIDE 0.9 % IV SOLN: 250.0000 mL | INTRAVENOUS | Status: DC | PRN

## 2020-12-19 MED ORDER — FENTANYL CITRATE (PF) 100 MCG/2ML IJ SOLN
INTRAMUSCULAR | Status: DC | PRN
  Administered 2020-12-19: 25 ug via INTRAVENOUS

## 2020-12-19 MED ORDER — HYDRALAZINE HCL 20 MG/ML IJ SOLN: 5.0000 mg | INTRAMUSCULAR | Status: DC | PRN

## 2020-12-19 MED ORDER — MIDAZOLAM HCL 2 MG/2ML IJ SOLN
INTRAMUSCULAR | Status: DC | PRN
  Administered 2020-12-19: 1 mg via INTRAVENOUS

## 2020-12-19 MED ORDER — IODIXANOL 320 MG/ML IV SOLN
INTRAVENOUS | Status: DC | PRN
  Administered 2020-12-19: 97 mL via INTRA_ARTERIAL

## 2020-12-19 SURGICAL SUPPLY — 10 items
CATH OMNI FLUSH 5F 65CM (CATHETERS) ×1 IMPLANT
DEVICE CLOSURE MYNXGRIP 5F (Vascular Products) ×1 IMPLANT
KIT PV (KITS) ×2 IMPLANT
SHEATH PINNACLE 5F 10CM (SHEATH) ×1 IMPLANT
SHEATH PROBE COVER 6X72 (BAG) ×1 IMPLANT
SYR MEDRAD MARK V 150ML (SYRINGE) ×2 IMPLANT
TRANSDUCER W/STOPCOCK (MISCELLANEOUS) ×2 IMPLANT
TRAY PV CATH (CUSTOM PROCEDURE TRAY) ×2 IMPLANT
WIRE BENTSON .035X145CM (WIRE) ×1 IMPLANT
WIRE MICRO SET SILHO 5FR 7 (SHEATH) ×2 IMPLANT

## 2020-12-19 NOTE — Op Note (Signed)
    Patient name: Marilyn Sexton MRN: [id number] DOB: September 28, 1945 Sex: female  12/19/2020 Pre-operative Diagnosis: Left leg cellulitis with concern for underlying PAD with lifestyle limiting claudication Post-operative diagnosis:  Same Surgeon:  Cephus Shelling, MD Procedure Performed: 1.  Ultrasound-guided access right common femoral artery 2.  Aortogram including catheter selection of aorta 3.  Bilateral lower extremity arteriogram with runoff 4.  19 minutes of monitored moderate conscious sedation time 5.  Mynx closure of the right common femoral artery  Indications: Patient is a 75 year old female recently hospitalized with cellulitis in the left lower extremity.  Ultimately she leg got better with antibiotics and she presented to the office for follow-up also endorsing left leg claudication that she felt was lifestyle limiting.  She presents today for lower extremity arteriogram and possible intervention after risk benefits discussed.  Findings:   Aortogram showed patent renal arteries bilaterally with about a 20% stenosis at the left renal artery ostium.  The infrarenal aorta and bilateral iliacs were widely patent with no flow-limiting stenosis.   Right lower extremity arteriogram showed a patent common femoral, profunda, and SFA.  The mid SFA did have a focal about 50% stenosis.  The popliteal artery was patent with three-vessel runoff.  She does have calcified vessels.  Left lower extremity arteriogram showed a patent common femoral, profunda, SFA, popliteal and three-vessel runoff.  There was no obvious stenosis although the vessels are calcified.  Procedure:  The patient was identified in the holding area and taken to room 8.  The patient was then placed supine on the table and prepped and draped in the usual sterile fashion.  A time out was called.  Ultrasound was used to evaluate the right common femoral artery.  It was patent .  A digital ultrasound image was acquired.  A  micropuncture needle was used to access the right common femoral artery under ultrasound guidance.  An 018 wire was advanced without resistance and a micropuncture sheath was placed.  The 018 wire was removed and a benson wire was placed.  The micropuncture sheath was exchanged for a 5 french sheath.  An omniflush catheter was advanced over the wire to the level of L-1.  An abdominal angiogram was obtained.  Next the catheter was pulled down and bilateral lower extremity runoff was obtained.  Pertinent findings are noted above.  Ultimately she has no significant stenosis in the left lower extremity to explain her leg pain or recent bout of cellulitis.  No indication for intervention.  Ultimately wires and catheters were removed.  A 5 French mynx was deployed in the right common femoral artery.  Taken to recovery in stable condition.  Plan: Patient does not have any significant underlying arterial disease to explain her left leg pain.   Cephus Shelling, MD [facility] Office: [phone]

## 2020-12-19 NOTE — H&P (Signed)
History and Physical Interval Note:  12/19/2020 10:53 AM  Marilyn Sexton  has presented today for surgery, with the diagnosis of PVD.  The various methods of treatment have been discussed with the patient and family. After consideration of risks, benefits and other options for treatment, the patient has consented to  Procedure(s): ABDOMINAL AORTOGRAM W/LOWER EXTREMITY (N/A) as a surgical intervention.  The patient's history has been reviewed, patient examined, no change in status, stable for surgery.  I have reviewed the patient's chart and labs.  Questions were answered to the patient's satisfaction.     Cephus Shelling  Patient name: Marilyn Sexton   MRN: [id number]        DOB: Jul 10, 1945          Sex: female   REASON FOR VISIT: Hospital follow-up left leg cellulitis   HPI: Marilyn Sexton is a 75 y.o. female with history of diabetes, hypertension, coronary artery disease that presents for hospital follow-up for left leg cellulitis.  She was admitted for IV antibiotics after she had some trauma to the leg from foreign body when she was helping her husband move sticks in the woods.  This has since healed.  She did get noninvasive imaging in the hospital and vascular surgery was consulted.  At the time she did endorse pain particularly in the left calf when walking.  She feels this is worse and states she cannot go to the grocery store.  States she cannot walk to the front desk of our office.  States she cannot walk down the pier at [employer] before her left leg gives out.       Past Medical History:  Diagnosis Date   Diabetes mellitus without complication (HCC)     Dyspnea     Hypertension     Myocardial infarction [facility])     Thyroid disease             Past Surgical History:  Procedure Laterality Date   CARDIAC CATHETERIZATION       CHOLECYSTECTOMY       coronary stents       EYE SURGERY        bilateral cataracts   KNEE ARTHROSCOPY Right 12/16/2016    Procedure:  RIGHT KNEE ARTHROSCOPY AND DEBRIDEMENT;  Surgeon: Nadara Mustard, MD;  Location: [facility] OR;  Service: Orthopedics;  Laterality: Right;   TOTAL KNEE ARTHROPLASTY Right 12/31/2017    Procedure: RIGHT TOTAL KNEE ARTHROPLASTY;  Surgeon: Jodi Geralds, MD;  Location: [facility] ORS;  Service: Orthopedics;  Laterality: Right;  Adductor Block   TUBAL LIGATION          History reviewed. No pertinent family history.   SOCIAL HISTORY: Social History         Tobacco Use   Smoking status: Former      Packs/day: 1.00      Years: 44.00      Pack years: 44.00      Types: Cigarettes      Quit date: 2006      Years since quitting: 16.7   Smokeless tobacco: Never  Substance Use Topics   Alcohol use: No      No Known Allergies         Current Outpatient Medications  Medication Sig Dispense Refill   amLODipine (NORVASC) 5 MG tablet Take 5 mg by mouth daily.       aspirin EC 81 MG tablet Take 81 mg by mouth daily. Swallow whole.  benztropine (COGENTIN) 0.5 MG tablet Take 1 tablet (0.5 mg total) by mouth 2 (two) times daily. 30 tablet 0   docusate sodium (COLACE) 100 MG capsule Take 1 capsule (100 mg total) by mouth 2 (two) times daily. 30 capsule 0   Doxepin HCl 3 MG TABS Take 1 tablet by mouth at bedtime.       furosemide (LASIX) 20 MG tablet Take 20 mg by mouth daily.       insulin lispro protamine-lispro (HUMALOG MIX 75/25) (75-25) 100 UNIT/ML SUSP injection Inject 20 Units into the skin in the morning and at bedtime.       Ipratropium-Albuterol (COMBIVENT) 20-100 MCG/ACT AERS respimat Inhale 1 puff into the lungs every 6 (six) hours. 1 each 2   metoprolol tartrate (LOPRESSOR) 50 MG tablet Take 50 mg by mouth 2 (two) times daily.       mirabegron ER (MYRBETRIQ) 50 MG TB24 tablet Take 50 mg by mouth daily.        Propylene Glycol 0.6 % SOLN Place 2 drops into both eyes daily as needed (for dry eyes).        risperiDONE (RISPERDAL) 1 MG tablet Take 1 mg by mouth 2 (two) times daily.       vitamin B-12  1000 MCG tablet Take 1 tablet (1,000 mcg total) by mouth daily. 20 tablet 0   nitroGLYCERIN (NITROSTAT) 0.4 MG SL tablet Place 1 tablet (0.4 mg total) under the tongue every 5 (five) minutes x 3 doses as needed for chest pain. (Patient not taking: Reported on 12/10/2020) 25 tablet 12    No current facility-administered medications for this visit.      REVIEW OF SYSTEMS:  [X]  denotes positive finding, [ ]  denotes negative finding Cardiac   Comments:  Chest pain or chest pressure:      Shortness of breath upon exertion:      Short of breath when lying flat:      Irregular heart rhythm:             Vascular      Pain in calf, thigh, or hip brought on by ambulation: x Left  Pain in feet at night that wakes you up from your sleep:       Blood clot in your veins:      Leg swelling:              Pulmonary      Oxygen at home:      Productive cough:       Wheezing:              Neurologic      Sudden weakness in arms or legs:       Sudden numbness in arms or legs:       Sudden onset of difficulty speaking or slurred speech:      Temporary loss of vision in one eye:       Problems with dizziness:              Gastrointestinal      Blood in stool:       Vomited blood:              Genitourinary      Burning when urinating:       Blood in urine:             Psychiatric      Major depression:  Hematologic      Bleeding problems:      Problems with blood clotting too easily:             Skin      Rashes or ulcers:             Constitutional      Fever or chills:          PHYSICAL EXAM:    Vitals:    12/10/20 1157  BP: (!) 144/53  Pulse: 74  Resp: 14  Temp: (!) 97.3 F (36.3 C)  TempSrc: Temporal  SpO2: 95%  Weight: 199 lb (90.3 kg)  Height: 5\' 4"  (1.626 m)      GENERAL: The patient is a well-nourished female, in no acute distress. The vital signs are documented above. CARDIAC: There is a regular rate and rhythm.  VASCULAR:  Palpable femoral pulses  bilaterally Right PT palpable PULMONARY: No respiratory distress ABDOMEN: Soft and non-tender MUSCULOSKELETAL: There are no major deformities or cyanosis. NEUROLOGIC: No focal weakness or paresthesias are detected. SKIN: There are no ulcers or rashes noted. PSYCHIATRIC: The patient has a normal affect.   DATA:    ABIs 11/03/20 0.72 on the right and 0.7 on the left   Assessment/Plan:     75 year old female presents for hospital follow-up regarding cellulitis of her left lower extremity.  As previously discussed in the hospital I did not feel that peripheral arterial insufficiency was the etiology for her cellulitis and this has since healed with IV antibiotics and likely related to a foreign body/trauma.  She does endorse left leg short distance claudication.  This sounds fairly debilitating given she states she can no longer go to the grocery store or walk to the front of our office without having to stop.  States she gets pain in the left calf.  Discussed claudication is not limb threatening.  Discussed we should reserve intervention for only when she feels she cannot tolerate her symptoms.  She wants to proceed with intervention I have offered her aortogram, left leg arteriogram, and possible intervention.  We will get her scheduled for next week.  Risk benefits discussed.     67, MD [facility] Office: [phone]

## 2020-12-20 ENCOUNTER — Encounter (HOSPITAL_COMMUNITY): Payer: Self-pay | Admitting: Vascular Surgery

## 2021-05-17 ENCOUNTER — Emergency Department (HOSPITAL_COMMUNITY)
Admission: EM | Admit: 2021-05-17 | Discharge: 2021-05-17 | Disposition: A | Discharge status: Home / Self Care | Payer: Medicare Other | Attending: Emergency Medicine | Admitting: Emergency Medicine

## 2021-05-17 ENCOUNTER — Emergency Department (HOSPITAL_COMMUNITY): Payer: Medicare Other

## 2021-05-17 ENCOUNTER — Encounter (HOSPITAL_COMMUNITY): Payer: Self-pay

## 2021-05-17 DIAGNOSIS — R202 Paresthesia of skin: Secondary | ICD-10-CM | POA: Insufficient documentation

## 2021-05-17 DIAGNOSIS — Z794 Long term (current) use of insulin: Secondary | ICD-10-CM | POA: Insufficient documentation

## 2021-05-17 DIAGNOSIS — M545 Low back pain, unspecified: Secondary | ICD-10-CM | POA: Insufficient documentation

## 2021-05-17 DIAGNOSIS — I10 Essential (primary) hypertension: Secondary | ICD-10-CM | POA: Insufficient documentation

## 2021-05-17 DIAGNOSIS — Z79899 Other long term (current) drug therapy: Secondary | ICD-10-CM | POA: Insufficient documentation

## 2021-05-17 DIAGNOSIS — E119 Type 2 diabetes mellitus without complications: Secondary | ICD-10-CM | POA: Insufficient documentation

## 2021-05-17 DIAGNOSIS — Z7982 Long term (current) use of aspirin: Secondary | ICD-10-CM | POA: Insufficient documentation

## 2021-05-17 LAB — URINALYSIS, ROUTINE W REFLEX MICROSCOPIC
Bilirubin Urine: NEGATIVE
Glucose, UA: NEGATIVE mg/dL
Hgb urine dipstick: NEGATIVE
Ketones, ur: NEGATIVE mg/dL
Leukocytes,Ua: NEGATIVE
Nitrite: NEGATIVE
Protein, ur: NEGATIVE mg/dL
Specific Gravity, Urine: 1.008 (ref 1.005–1.030)
pH: 7 (ref 5.0–8.0)

## 2021-05-17 MED ORDER — TRAMADOL HCL 50 MG PO TABS
50.0000 mg | ORAL_TABLET | Freq: Once | ORAL | Status: AC
  Administered 2021-05-17: 50 mg via ORAL
  Filled 2021-05-17: qty 1

## 2021-05-17 MED ORDER — TRAMADOL HCL 50 MG PO TABS: 50.0000 mg | ORAL_TABLET | Freq: Two times a day (BID) | ORAL | 0 refills | Status: DC | PRN

## 2021-05-17 NOTE — Discharge Instructions (Signed)
Alternate ice and heat to areas of injury 3-4 times per day to limit inflammation and spasm.  Avoid strenuous activity and heavy lifting.  We recommend consistent use of Tylenol for pain.  You have been prescribed tramadol to take as needed for severe pain.  Do not drive or drink alcohol after taking this medication as it may make you drowsy and impair your judgment.  We recommend follow-up with a primary care doctor to ensure resolution of symptoms.  Return to the ED for any new or concerning symptoms. ?

## 2021-05-17 NOTE — ED Notes (Signed)
Patient ambulated in hallway well. No complaints noted at this time.  ?

## 2021-05-17 NOTE — ED Provider Notes (Signed)
[facility]   CSN: [id number] Arrival date & time: 05/17/21  [id number]     History  Chief Complaint  Patient presents with   Back Pain    Marilyn Sexton is a 76 y.o. female.  76 year old female presents to the emergency department for evaluation of low back pain.  She reports that pain has been constant and sharp, shooting since yesterday afternoon when she went to get out of the car.  She reports that movement and ambulation aggravates her pain.  It radiates up and down the left side of her back.  She has tried Tylenol for symptoms which eases the pain off some.  She continues to ambulate in her home without the use of an assist device, though she has a walker she can use if needed.  She denies any fall or trauma to her back, chronic steroid use, fevers, urinary symptoms, bowel or bladder incontinence, new or worsening lower extremity weakness or paresthesias.  She does report having some paresthesias in her legs at baseline.  The history is provided by the patient and the spouse. No language interpreter was used.  Back Pain     Home Medications Prior to Admission medications   Medication Sig Start Date End Date Taking? Authorizing Provider  traMADol (ULTRAM) 50 MG tablet Take 1 tablet (50 mg total) by mouth 2 (two) times daily as needed for severe pain. 05/17/21  Yes Antony MaduraHumes, Diamon Reddinger, PA-C  amLODipine (NORVASC) 5 MG tablet Take 5 mg by mouth daily.    [provider]  aspirin EC 81 MG tablet Take 81 mg by mouth daily. Swallow whole.    [provider]  benazepril (LOTENSIN) 20 MG tablet Take 20 mg by mouth 2 (two) times daily.    [provider]  Doxepin HCl 3 MG TABS Take 3 mg by mouth at bedtime. 09/13/20   [provider]  insulin lispro protamine-lispro (HUMALOG MIX 75/25) (75-25) 100 UNIT/ML SUSP injection Inject 20 Units into the skin in the morning and at bedtime.    [provider]   Ipratropium-Albuterol (COMBIVENT) 20-100 MCG/ACT AERS respimat Inhale 1 puff into the lungs every 6 (six) hours. Patient taking differently: Inhale 1 puff into the lungs every 6 (six) hours as needed for wheezing or shortness of breath. 11/05/20   Regalado, Belkys A, MD  LORazepam (ATIVAN) 0.5 MG tablet Take 0.5 mg by mouth every 8 (eight) hours as needed for anxiety.    [provider]  metoprolol tartrate (LOPRESSOR) 50 MG tablet Take 50 mg by mouth 2 (two) times daily.    [provider]  mirabegron ER (MYRBETRIQ) 50 MG TB24 tablet Take 50 mg by mouth daily.     [provider]  nitroGLYCERIN (NITROSTAT) 0.4 MG SL tablet Place 1 tablet (0.4 mg total) under the tongue every 5 (five) minutes x 3 doses as needed for chest pain. 08/16/17   Rinaldo CloudHarwani, Mohan, MD  Propylene Glycol 0.6 % SOLN Place 2 drops into both eyes daily as needed (for dry eyes).     [provider]  risperiDONE (RISPERDAL) 1 MG tablet Take 1 mg by mouth at bedtime.    [provider]  vitamin B-12 1000 MCG tablet Take 1 tablet (1,000 mcg total) by mouth daily. 11/06/20   Regalado, Belkys A, MD  vitamin C (ASCORBIC ACID) 500 MG tablet Take 500 mg by mouth daily.    [provider]      Allergies  Patient has no known allergies.    Review of Systems   Review of Systems  Musculoskeletal:  Positive for back pain.  Ten systems reviewed and are negative for acute change, except as noted in the HPI.    Physical Exam Updated Vital Signs BP (!) 198/85 (BP Location: Left Arm)    Pulse 70    Temp (!) 97.4 F (36.3 C)    Resp (!) 22    SpO2 96%   Physical Exam Vitals and nursing note reviewed.  Constitutional:      General: She is not in acute distress.    Appearance: She is well-developed. She is not diaphoretic.     Comments: Obese, nontoxic appearing female.  HENT:     Head: Normocephalic and atraumatic.  Eyes:     General: No scleral icterus.    Conjunctiva/sclera:  Conjunctivae normal.  Cardiovascular:     Rate and Rhythm: Normal rate and regular rhythm.     Pulses: Normal pulses.  Pulmonary:     Effort: Pulmonary effort is normal. No respiratory distress.     Breath sounds: No stridor. No wheezing.     Comments: Respirations even and unlabored Musculoskeletal:     Cervical back: Normal range of motion.       Back:  Skin:    General: Skin is warm and dry.     Coloration: Skin is not pale.     Findings: No erythema or rash.  Neurological:     Mental Status: She is alert and oriented to person, place, and time.     Coordination: Coordination normal.     Comments: Sensation intact and equal in bilateral lower extremities.  No appreciable strength deficit in BLE.  5/5 dorsiflexion and plantarflexion against resistance.  Psychiatric:        Behavior: Behavior normal.    ED Results / Procedures / Treatments   Labs (all labs ordered are listed, but only abnormal results are displayed) Labs Reviewed  URINALYSIS, ROUTINE W REFLEX MICROSCOPIC - Abnormal; Notable for the following components:      Result Value   Color, Urine STRAW (*)    All other components within normal limits    EKG None  Radiology DG Lumbar Spine Complete  Result Date: 05/17/2021 CLINICAL DATA:  Acute low back pain for 1 day. No known injury. Initial encounter. EXAM: LUMBAR SPINE - COMPLETE 4+ VIEW COMPARISON:  06/08/2017 MR FINDINGS: Five non rib-bearing lumbar type vertebra are again identified in normal alignment. Mild multilevel degenerative disc disease/spondylosis again identified. No suspicious focal bony lesions or definite spondylolysis noted. Cholecystectomy clips and aortic atherosclerotic calcifications identified. IMPRESSION: 1. No evidence of acute abnormality. 2. Mild multilevel degenerative disc disease/spondylosis. Electronically Signed   By: Harmon Pier M.D.   On: 05/17/2021 09:57    Procedures Procedures    Medications Ordered in ED Medications   traMADol (ULTRAM) tablet 50 mg (50 mg Oral Given 05/17/21 0729)    ED Course/ Medical Decision Making/ A&P Clinical Course as of 05/17/21 1106  Sat May 17, 2021  6834 Reviewed prior Aortogram from 12/19/20.   Findings:    Aortogram showed patent renal arteries bilaterally with about a 20% stenosis at the left renal artery ostium.  The infrarenal aorta and bilateral iliacs were widely patent with no flow-limiting stenosis.              Right lower extremity arteriogram showed a patent common femoral, profunda, and SFA.  The mid SFA did have a  focal about 50% stenosis.  The popliteal artery was patent with three-vessel runoff.  She does have calcified vessels.   Left lower extremity arteriogram showed a patent common femoral, profunda, SFA, popliteal and three-vessel runoff.  There was no obvious stenosis although the vessels are calcified. [KH]  [phone] Per chart review, patient appears to have had poor BP control since 11/2020 with systolic ranging from 170-190. [KH]  0800 UA reviewed; negative for UTI [KH]    Clinical Course User Index [KH] Antony Madura, PA-C                           Medical Decision Making Amount and/or Complexity of Data Reviewed Labs: ordered. Radiology: ordered.  Risk Prescription drug management.   This patient presents to the ED for concern of back pain, this involves an extensive number of treatment options, and is a complaint that carries with it a high risk of complications and morbidity.  The differential diagnosis includes radiculopathy vs muscle strain vs compression or pathologic fx vs UTI/pyelonephritis vs    Co morbidities that complicate the patient evaluation  Obesity DM HTN ACS   Additional history obtained:  Additional history obtained from spouse External records from outside source obtained and reviewed including prior admission and evaluation with vascular surgery.   Lab Tests:  I Ordered, and personally interpreted labs.  The  pertinent results include:  negative UA   Imaging Studies ordered:  I ordered imaging studies including Xray lumbar spine  I independently visualized and interpreted imaging which showed degenerative changes without acute abnormality I agree with the radiologist interpretation   Medicines ordered and prescription drug management:  I ordered medication including Tramadol for pain  Reevaluation of the patient after these medicines showed that the patient improved I have reviewed the patients home medicines and have made adjustments as needed   Test Considered:  MRI lumbar   Reevaluation:  After the interventions noted above, I reevaluated the patient and found that they have :improved   Social Determinants of Health:  Good social support Ambulates with a walker PRN   Dispostion:  Patient with back pain since getting out of the car yesterday.  She is neurovascularly intact.  Patient can walk but states is painful.  Ambulatory in the ED with steady gait without assistance.  No loss of bowel or bladder control.  No concern for cauda equina.  No associated fevers, hx of cancer.  Xrays obtained and reassuring.  UA w/o concern for UTI/pyelonephritis.  Now feeling better after Tramadol.  After consideration of the diagnostic results and the patients response to treatment, I feel that the patent would benefit from continued outpatient PCP follow up.  RICE protocol and pain medicine indicated and discussed with patient.           Final Clinical Impression(s) / ED Diagnoses Final diagnoses:  Acute left-sided low back pain, unspecified whether sciatica present    Rx / DC Orders ED Discharge Orders          Ordered    traMADol (ULTRAM) 50 MG tablet  2 times daily PRN        05/17/21 1008              Antony Madura, PA-C 05/17/21 1116    Jacalyn Lefevre, MD 05/17/21 1118

## 2021-05-17 NOTE — ED Notes (Signed)
Pt transported to xray 

## 2021-05-17 NOTE — ED Notes (Signed)
The pt  was getting out of her truck and when she stepped down sh had a sudden sharp pain in her lower back  no hx os the same ?

## 2021-05-17 NOTE — ED Triage Notes (Signed)
Pt states that her lower back pain since yesterday, denies injury, denies urinary symptoms.  ?

## 2021-10-29 ENCOUNTER — Encounter (HOSPITAL_COMMUNITY): Payer: Self-pay

## 2021-10-29 ENCOUNTER — Other Ambulatory Visit: Payer: Self-pay

## 2021-10-29 ENCOUNTER — Emergency Department (HOSPITAL_COMMUNITY)
Admission: EM | Admit: 2021-10-29 | Discharge: 2021-10-29 | Disposition: A | Discharge status: Home / Self Care | Payer: Medicare Other | Attending: Emergency Medicine | Admitting: Emergency Medicine

## 2021-10-29 DIAGNOSIS — J449 Chronic obstructive pulmonary disease, unspecified: Secondary | ICD-10-CM | POA: Insufficient documentation

## 2021-10-29 DIAGNOSIS — E119 Type 2 diabetes mellitus without complications: Secondary | ICD-10-CM | POA: Diagnosis not present

## 2021-10-29 DIAGNOSIS — Z79899 Other long term (current) drug therapy: Secondary | ICD-10-CM | POA: Insufficient documentation

## 2021-10-29 DIAGNOSIS — Z7982 Long term (current) use of aspirin: Secondary | ICD-10-CM | POA: Diagnosis not present

## 2021-10-29 DIAGNOSIS — Z794 Long term (current) use of insulin: Secondary | ICD-10-CM | POA: Diagnosis not present

## 2021-10-29 DIAGNOSIS — R21 Rash and other nonspecific skin eruption: Secondary | ICD-10-CM | POA: Insufficient documentation

## 2021-10-29 MED ORDER — PREDNISONE 20 MG PO TABS: 40.0000 mg | ORAL_TABLET | Freq: Every day | ORAL | 0 refills | Status: AC

## 2021-10-29 NOTE — ED Provider Notes (Signed)
[facility] EMERGENCY DEPARTMENT Provider Note   CSN: [id number] Arrival date & time: 10/29/21  1137     History  Chief Complaint  Patient presents with   Rash    Marilyn Sexton is a 76 y.o. female with a past medical history significant for bipolar disorder, type 2 diabetes, COPD, and PAD who presents to the ED due to rash x2 days.  Patient states she was working in the yard 2 days ago and developed a rash throughout her right lower extremity, trunk, and chest.  No fever or chills.  Patient states she has been using over-the-counter hydrocortisone cream with no relief.  She endorses pruritus.  No drainage from rash.  No recent tick bites.  Denies edematous joints.  Denies new medications or products.  No difficulties breathing, abdominal pain, nausea, or vomiting.  No recent illness.  History obtained from patient and past medical records. No interpreter used during encounter.       Home Medications Prior to Admission medications   Medication Sig Start Date End Date Taking? Authorizing Provider  amLODipine (NORVASC) 5 MG tablet Take 5 mg by mouth daily.    [provider]  aspirin EC 81 MG tablet Take 81 mg by mouth daily. Swallow whole.    [provider]  benazepril (LOTENSIN) 20 MG tablet Take 20 mg by mouth 2 (two) times daily.    [provider]  Doxepin HCl 3 MG TABS Take 3 mg by mouth at bedtime. 09/13/20   [provider]  insulin lispro protamine-lispro (HUMALOG MIX 75/25) (75-25) 100 UNIT/ML SUSP injection Inject 20 Units into the skin in the morning and at bedtime.    [provider]  Ipratropium-Albuterol (COMBIVENT) 20-100 MCG/ACT AERS respimat Inhale 1 puff into the lungs every 6 (six) hours. Patient taking differently: Inhale 1 puff into the lungs every 6 (six) hours as needed for wheezing or shortness of breath. 11/05/20   Regalado, Belkys A, MD  LORazepam (ATIVAN) 0.5 MG tablet Take 0.5 mg by mouth every  8 (eight) hours as needed for anxiety.    [provider]  metoprolol tartrate (LOPRESSOR) 50 MG tablet Take 50 mg by mouth 2 (two) times daily.    [provider]  mirabegron ER (MYRBETRIQ) 50 MG TB24 tablet Take 50 mg by mouth daily.     [provider]  nitroGLYCERIN (NITROSTAT) 0.4 MG SL tablet Place 1 tablet (0.4 mg total) under the tongue every 5 (five) minutes x 3 doses as needed for chest pain. 08/16/17   Rinaldo Cloud, MD  Propylene Glycol 0.6 % SOLN Place 2 drops into both eyes daily as needed (for dry eyes).     [provider]  risperiDONE (RISPERDAL) 1 MG tablet Take 1 mg by mouth at bedtime.    [provider]  traMADol (ULTRAM) 50 MG tablet Take 1 tablet (50 mg total) by mouth 2 (two) times daily as needed for severe pain. 05/17/21   Antony Madura, PA-C  vitamin B-12 1000 MCG tablet Take 1 tablet (1,000 mcg total) by mouth daily. 11/06/20   Regalado, Belkys A, MD  vitamin C (ASCORBIC ACID) 500 MG tablet Take 500 mg by mouth daily.    [provider]      Allergies    Patient has no known allergies.    Review of Systems   Review of Systems  Constitutional:  Negative for chills and fever.  Skin:  Positive for rash.    Physical  Exam Updated Vital Signs BP (!) 147/77 (BP Location: Right Arm)   Pulse 78   Temp 97.8 F (36.6 C)   Resp 16   Ht 5\' 4"  (1.626 m)   Wt 88.9 kg   SpO2 96%   BMI 33.64 kg/m  Physical Exam Vitals and nursing note reviewed.  Constitutional:      General: She is not in acute distress.    Appearance: She is not ill-appearing.  HENT:     Head: Normocephalic.  Eyes:     Pupils: Pupils are equal, round, and reactive to light.  Cardiovascular:     Rate and Rhythm: Normal rate and regular rhythm.     Pulses: Normal pulses.     Heart sounds: Normal heart sounds. No murmur heard.    No friction rub. No gallop.  Pulmonary:     Effort: Pulmonary effort is normal.     Breath sounds: Normal breath  sounds.  Abdominal:     General: Abdomen is flat. There is no distension.     Palpations: Abdomen is soft.     Tenderness: There is no abdominal tenderness. There is no guarding or rebound.  Musculoskeletal:        General: Normal range of motion.     Cervical back: Neck supple.  Skin:    General: Skin is warm and dry.     Comments: Urticaria to trunk, upper left chest, and right lower extremity.  See photos below.  Neurological:     General: No focal deficit present.     Mental Status: She is alert.  Psychiatric:        Mood and Affect: Mood normal.        Behavior: Behavior normal.          ED Results / Procedures / Treatments   Labs (all labs ordered are listed, but only abnormal results are displayed) Labs Reviewed - No data to display  EKG None  Radiology No results found.  Procedures Procedures    Medications Ordered in ED Medications - No data to display  ED Course/ Medical Decision Making/ A&P                           Medical Decision Making  76 year old female presents to the ED due to rash after doing yard work 2 days ago.  No fever or chills.  Patient has a history of diabetes and notes her glucose typically runs in the 100s.  Denies recent illness.  No new products or medications.  Upon arrival, patient afebrile, not tachycardic or hypoxic.  Patient in no acute distress. Urticarial rash to right lower extremity and trunk.  See photos above.  No infectious symptoms to suggest meningitis or infectious etiology.  No new medications to suggest Stevens-Johnson syndrome. Shared decision making in regards to topical steroids versus oral steroids and patient prefers to take oral steroids.  Education was given to patient in regards to elevated glucose while on oral steroids.  Advised patient to follow-up with PCP if symptoms not improve over the next few days. Patient discharged with steroids. Strict ED precautions discussed with patient. Patient states  understanding and agrees to plan. Patient discharged home in no acute distress and stable vitals.  Discussed with Dr. 66 who evaluated patient at bedside and agrees with assessment and plan.          Final Clinical Impression(s) / ED Diagnoses Final diagnoses:  Rash  Rx / DC Orders ED Discharge Orders     None         Jesusita Oka 10/29/21 1226    Rondel Baton, MD 10/29/21 1925

## 2021-10-29 NOTE — Discharge Instructions (Addendum)
It was a pleasure taking care of you today.  As discussed, your rash appears to be some type of allergic reaction.  I am sending you home with steroids.  Take for the next 5 days.  Steroids can increase your sugar levels so keep a close eye on them.  Please follow-up with PCP in the next few days.  Return to the ER for new or worsening symptoms.
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
## 2021-10-29 NOTE — ED Notes (Signed)
Reviewed discharge instructions with patient. Follow-up care and medications reviewed. Patient  verbalized understanding. Patient A&Ox4, VSS, and ambulatory with steady gait upon discharge.  °

## 2022-03-18 ENCOUNTER — Other Ambulatory Visit: Payer: Self-pay | Admitting: Orthopedic Surgery

## 2022-03-18 DIAGNOSIS — M25511 Pain in right shoulder: Secondary | ICD-10-CM

## 2022-04-07 ENCOUNTER — Ambulatory Visit
Admission: RE | Admit: 2022-04-07 | Discharge: 2022-04-07 | Disposition: A | Discharge status: Home / Self Care | Payer: Medicare Other | Source: Ambulatory Visit | Attending: Orthopedic Surgery | Admitting: Orthopedic Surgery

## 2022-04-07 DIAGNOSIS — M25511 Pain in right shoulder: Secondary | ICD-10-CM

## 2022-06-30 ENCOUNTER — Emergency Department (HOSPITAL_COMMUNITY)
Admission: EM | Admit: 2022-06-30 | Discharge: 2022-06-30 | Disposition: A | Discharge status: Home / Self Care | Payer: Medicare Other | Attending: Emergency Medicine | Admitting: Emergency Medicine

## 2022-06-30 ENCOUNTER — Emergency Department (HOSPITAL_COMMUNITY): Payer: Medicare Other

## 2022-06-30 ENCOUNTER — Other Ambulatory Visit: Payer: Self-pay

## 2022-06-30 DIAGNOSIS — W010XXA Fall on same level from slipping, tripping and stumbling without subsequent striking against object, initial encounter: Secondary | ICD-10-CM | POA: Diagnosis not present

## 2022-06-30 DIAGNOSIS — Z794 Long term (current) use of insulin: Secondary | ICD-10-CM | POA: Insufficient documentation

## 2022-06-30 DIAGNOSIS — S99921A Unspecified injury of right foot, initial encounter: Secondary | ICD-10-CM | POA: Diagnosis present

## 2022-06-30 DIAGNOSIS — Z7982 Long term (current) use of aspirin: Secondary | ICD-10-CM | POA: Insufficient documentation

## 2022-06-30 DIAGNOSIS — S93401A Sprain of unspecified ligament of right ankle, initial encounter: Secondary | ICD-10-CM | POA: Insufficient documentation

## 2022-06-30 DIAGNOSIS — Y92009 Unspecified place in unspecified non-institutional (private) residence as the place of occurrence of the external cause: Secondary | ICD-10-CM | POA: Insufficient documentation

## 2022-06-30 NOTE — ED Triage Notes (Signed)
Pt reports mechanical fall yesterday, tripping over a rug, and having pain to RLE now. Denies other injury.

## 2022-06-30 NOTE — ED Provider Triage Note (Signed)
Emergency Medicine Provider Triage Evaluation Note  Marilyn Sexton , a 77 y.o. female  was evaluated in triage.  Pt complains of right ankle pain after mechanical fall yesterday.  States that she tripped on a rug and fell injuring her lateral right ankle.  No other injuries, did not hit head, no LOC..  Review of Systems  Positive:  Negative:   Physical Exam  BP 125/61 (BP Location: Left Arm)   Pulse 72   Temp 98.6 F (37 C) (Oral)   Resp 16   SpO2 97%  Gen:   Awake, no distress   Resp:  Normal effort  MSK:   Moves extremities without difficulty  Other:  DP pulse present, sensation intact, tenderness palpation, lateral malleolus and distal fibula  Medical Decision Making  Medically screening exam initiated at 12:31 PM.  Appropriate orders placed.  Marilyn Sexton was informed that the remainder of the evaluation will be completed by another provider, this initial triage assessment does not replace that evaluation, and the importance of remaining in the ED until their evaluation is complete.     Jeannie Fend, PA-C 06/30/22 1235

## 2022-06-30 NOTE — Discharge Instructions (Signed)
You are seen in the emergency department today for a foot injury.  Based on x-ray imaging it appears that you have sustained an ankle sprain given that your imaging was negative for any fractures dislocations.  Ankle sprains are still fairly painful and can take a prolonged period of time to heal properly.  For this reason I provided you with an ankle brace here in the emergency department and advised to use your walker for additional support with mobility at home.  Over the first 2 days I would advise you try to rest the ankle is much as you can as it will be painful and tender when bearing weight.  Slowly try to bear weight and walk at home as best as you can.  After about 1 week or so, you should plan on trying to reduce the use of the ankle brace to reintroduce strength into the ankle to prevent any difficulty or prolongation of the healing process.  Please follow-up with your primary care provider during this time as well to ensure that there is no complications from this injury.  She feel your symptoms are worsening or you have a reinjury to the area, you may return to the emergency department for further evaluation or seek treatment at an urgent care or primary care office.  Otherwise I would advise that you manage her symptoms with rest, ice, compression, elevation.  You may also take Tylenol, ibuprofen, naproxen for your symptoms to help reduce the amount of pain and swelling in the area.

## 2022-06-30 NOTE — ED Provider Notes (Signed)
[place] EMERGENCY DEPARTMENT AT [facility] Provider Note   CSN: [id number] Arrival date & time: 06/30/22  1203     History Chief Complaint  Patient presents with   Foot Injury    Marilyn Sexton is a 77 y.o. female.  Patient presents emergency department complaints of a foot injury.  Patient reports a mechanical fall yesterday while at home when she tripped over a rug and began experience pain in her right ankle.  She reports that the pain is primarily in the lateral side of the right ankle.  Denies any prior injury or surgery in this area.  She denies take any medications for this pain at this time.  Not currently using any ankle brace for support.  Routinely uses a walker at home for mobility.    Foot Injury      Home Medications Prior to Admission medications   Medication Sig Start Date End Date Taking? Authorizing Provider  amLODipine (NORVASC) 5 MG tablet Take 5 mg by mouth daily.    [provider]  aspirin EC 81 MG tablet Take 81 mg by mouth daily. Swallow whole.    [provider]  benazepril (LOTENSIN) 20 MG tablet Take 20 mg by mouth 2 (two) times daily.    [provider]  Doxepin HCl 3 MG TABS Take 3 mg by mouth at bedtime. 09/13/20   [provider]  insulin lispro protamine-lispro (HUMALOG MIX 75/25) (75-25) 100 UNIT/ML SUSP injection Inject 20 Units into the skin in the morning and at bedtime.    [provider]  Ipratropium-Albuterol (COMBIVENT) 20-100 MCG/ACT AERS respimat Inhale 1 puff into the lungs every 6 (six) hours. Patient taking differently: Inhale 1 puff into the lungs every 6 (six) hours as needed for wheezing or shortness of breath. 11/05/20   Regalado, Belkys A, MD  LORazepam (ATIVAN) 0.5 MG tablet Take 0.5 mg by mouth every 8 (eight) hours as needed for anxiety.    [provider]  metoprolol tartrate (LOPRESSOR) 50 MG tablet Take 50 mg by mouth 2 (two) times daily.    [provider]  mirabegron ER (MYRBETRIQ) 50 MG TB24 tablet Take 50 mg by mouth daily.     [provider]  nitroGLYCERIN (NITROSTAT) 0.4 MG SL tablet Place 1 tablet (0.4 mg total) under the tongue every 5 (five) minutes x 3 doses as needed for chest pain. 08/16/17   Rinaldo Cloud, MD  Propylene Glycol 0.6 % SOLN Place 2 drops into both eyes daily as needed (for dry eyes).     [provider]  risperiDONE (RISPERDAL) 1 MG tablet Take 1 mg by mouth at bedtime.    [provider]  traMADol (ULTRAM) 50 MG tablet Take 1 tablet (50 mg total) by mouth 2 (two) times daily as needed for severe pain. 05/17/21   Antony Madura, PA-C  vitamin B-12 1000 MCG tablet Take 1 tablet (1,000 mcg total) by mouth daily. 11/06/20   Regalado, Belkys A, MD  vitamin C (ASCORBIC ACID) 500 MG tablet Take 500 mg by mouth daily.    [provider]      Allergies    Patient has no known allergies.    Review of Systems   Review of Systems  Musculoskeletal:  Positive for joint swelling.  All other systems reviewed and are negative.   Physical Exam Updated Vital Signs BP 125/61 (BP Location: Left Arm)   Pulse 72   Temp 98.6 F (37 C) (Oral)  Resp 16   SpO2 97%  Physical Exam Vitals and nursing note reviewed.  HENT:     Head: Normocephalic and atraumatic.  Eyes:     General: No scleral icterus.       Right eye: No discharge.        Left eye: No discharge.  Cardiovascular:     Rate and Rhythm: Normal rate and regular rhythm.  Pulmonary:     Effort: No respiratory distress.  Musculoskeletal:        General: Swelling and tenderness present. Normal range of motion.     Comments: Pain to palpation of the superior aspect of the lateral ankle. No obvious deformity or bruising. ROM largely normal but painful.  Skin:    Findings: No bruising, erythema or rash.     ED Results / Procedures / Treatments   Labs (all labs ordered are listed, but only abnormal results are  displayed) Labs Reviewed - No data to display  EKG None  Radiology DG Tibia/Fibula Right  Result Date: 06/30/2022 CLINICAL DATA:  Mechanical fall yesterday, tripped over a rug, unable to bear weight EXAM: RIGHT TIBIA AND FIBULA - 2 VIEW COMPARISON:  None Available. FINDINGS: Diffuse soft tissue swelling. Osseous demineralization. Ankle joint alignment normal. RIGHT knee prosthesis identified. No acute fracture, dislocation, or bone destruction. IMPRESSION: No acute osseous abnormalities. Electronically Signed   By: Ulyses Southward M.D.   On: 06/30/2022 13:25   DG Ankle Complete Right  Result Date: 06/30/2022 CLINICAL DATA:  Lateral ankle pain post fall EXAM: RIGHT ANKLE - COMPLETE 3+ VIEW COMPARISON:  None Available. FINDINGS: Osseous demineralization. Diffuse soft tissue swelling. Joint spaces preserved. No acute fracture, dislocation, or bone destruction. Bulky plantar calcaneal spur. IMPRESSION: Soft tissue swelling and calcaneal spurring without acute osseous abnormalities. Electronically Signed   By: Ulyses Southward M.D.   On: 06/30/2022 13:24    Procedures Procedures   Medications Ordered in ED Medications - No data to display  ED Course/ Medical Decision Making/ A&P                           Medical Decision Making  This patient presents to the ED for concern of foot injury.  Differential diagnosis includes ankle fracture, ankle dislocation, ankle sprain, metatarsal fracture, cellulitis   Imaging Studies ordered:  I ordered imaging studies including x-ray of right ankle, right tibia/fibula I independently visualized and interpreted imaging which showed no obvious fracture or dislocation noted I agree with the radiologist interpretation  Problem List / ED Course:  You were seen in the emergency department for a foot injury.  Given that you had a mechanical fall at home but no obvious head strike or other area of trauma, x-ray imaging focused on the right ankle and lower leg for  evaluation to ensure there is no evidence of an obvious fracture or dislocation.  Thankfully there is no signs of any fracture or dislocation.  On examination of patient, ankle appears to have a normal appearance with no obvious deformity.  There is point tenderness to the lateral malleolus but no other area that is exquisitely tender.  Patient's range of motion testing is largely normal but there is some limitation due to pain.  Will provide patient with ASO lace up ankle brace for additional support and advised patient to rest the ankle for a few days at home and slowly reintroduce mobility with use of walker.  Also advised patient to use over-the-counter  pain medication such as Tylenol, ibuprofen, Aleve for symptomatic relief.  Encourage patient to follow-up with primary care provider within the next week for further evaluation to treat her symptoms or not worsening.  Patient is agreeable to treatment plan verbalized understanding return precautions.  All questions answered prior to patient discharge.  Final Clinical Impression(s) / ED Diagnoses Final diagnoses:  Sprain of right ankle, unspecified ligament, initial encounter    Rx / DC Orders ED Discharge Orders     None         Smitty Knudsen, PA-C 06/30/22 1750    Tegeler, Canary Brim, MD 06/30/22 2357

## 2022-07-06 ENCOUNTER — Emergency Department (HOSPITAL_BASED_OUTPATIENT_CLINIC_OR_DEPARTMENT_OTHER)
Admission: EM | Admit: 2022-07-06 | Discharge: 2022-07-06 | Disposition: A | Discharge status: Home / Self Care | Payer: Medicare Other | Attending: Emergency Medicine | Admitting: Emergency Medicine

## 2022-07-06 ENCOUNTER — Other Ambulatory Visit: Payer: Self-pay

## 2022-07-06 ENCOUNTER — Encounter (HOSPITAL_BASED_OUTPATIENT_CLINIC_OR_DEPARTMENT_OTHER): Payer: Self-pay | Admitting: Emergency Medicine

## 2022-07-06 DIAGNOSIS — S93401D Sprain of unspecified ligament of right ankle, subsequent encounter: Secondary | ICD-10-CM

## 2022-07-06 DIAGNOSIS — Z7982 Long term (current) use of aspirin: Secondary | ICD-10-CM | POA: Insufficient documentation

## 2022-07-06 DIAGNOSIS — E119 Type 2 diabetes mellitus without complications: Secondary | ICD-10-CM | POA: Diagnosis not present

## 2022-07-06 DIAGNOSIS — S99911A Unspecified injury of right ankle, initial encounter: Secondary | ICD-10-CM | POA: Diagnosis present

## 2022-07-06 DIAGNOSIS — I10 Essential (primary) hypertension: Secondary | ICD-10-CM | POA: Diagnosis not present

## 2022-07-06 DIAGNOSIS — Z794 Long term (current) use of insulin: Secondary | ICD-10-CM | POA: Insufficient documentation

## 2022-07-06 DIAGNOSIS — Z79899 Other long term (current) drug therapy: Secondary | ICD-10-CM | POA: Diagnosis not present

## 2022-07-06 DIAGNOSIS — Y92019 Unspecified place in single-family (private) house as the place of occurrence of the external cause: Secondary | ICD-10-CM | POA: Diagnosis not present

## 2022-07-06 DIAGNOSIS — S93401A Sprain of unspecified ligament of right ankle, initial encounter: Secondary | ICD-10-CM | POA: Diagnosis not present

## 2022-07-06 DIAGNOSIS — W19XXXA Unspecified fall, initial encounter: Secondary | ICD-10-CM | POA: Insufficient documentation

## 2022-07-06 HISTORY — DX: Unspecified osteoarthritis, unspecified site: M19.90

## 2022-07-06 NOTE — ED Triage Notes (Signed)
Right foot p[ain , injury 1 week ago , xray and splint applied . Persistent pain and unable to ambulate due to pain she said

## 2022-07-06 NOTE — Discharge Instructions (Signed)
You were seen for your ankle sprain in the emergency department.   At home, please try to use a wheelchair as much as possible for the next few days.  You may weight-bear as tolerated.  Use Tylenol and ibuprofen for your pain.  Please elevate your ankle and use ice to limit the swelling.  Check your MyChart online for the results of any tests that had not resulted by the time you left the emergency department.   Please follow-up with sports medicine in 2-3 days.  Return immediately to the emergency department if you experience any concerning symptoms.  Thank you for visiting our Emergency Department. It was a pleasure taking care of you today.

## 2022-07-06 NOTE — ED Notes (Signed)
Discharge instructions reviewed with patient. Patient verbalizes understanding, no further questions at this time. Medications/prescriptions and follow up information provided. No acute distress noted at time of departure.   Patient in hallway requesting paperwork, had informed them 10 mins prior that the provider was working on typing them up and printing them, informed them I would bring it to the room asap. Patient attempting to leave, obtained paperwork and reviewed in hallway, unable to obtained discharge vitals.

## 2022-07-06 NOTE — ED Provider Notes (Signed)
[place] EMERGENCY DEPARTMENT AT [facility] Provider Note   CSN: [id number] Arrival date & time: 07/06/22  1115     History  Chief Complaint  Patient presents with   Foot Pain    right    Marilyn Sexton is a 77 y.o. female.  77 year old female with a history of hypertension, diabetes, and recent right ankle sprain who presents to the emergency department with right lower extremity pain.  Approximately 1 week ago patient had a fall at home when she tripped over a rug.  Went into the emergency department with negative x-rays and was given a diagnosis of ankle sprain and a splint.  Says that she was taking pain medications from the prior surgery that she had had but stopped these several days ago and was walking on her ankle more yesterday and started to have worsening pain.  No numbness or weakness of the foot.  Does have bilateral lower extremity swelling.       Home Medications Prior to Admission medications   Medication Sig Start Date End Date Taking? Authorizing Provider  amLODipine (NORVASC) 5 MG tablet Take 5 mg by mouth daily.    [provider]  aspirin EC 81 MG tablet Take 81 mg by mouth daily. Swallow whole.    [provider]  benazepril (LOTENSIN) 20 MG tablet Take 20 mg by mouth 2 (two) times daily.    [provider]  Doxepin HCl 3 MG TABS Take 3 mg by mouth at bedtime. 09/13/20   [provider]  insulin lispro protamine-lispro (HUMALOG MIX 75/25) (75-25) 100 UNIT/ML SUSP injection Inject 20 Units into the skin in the morning and at bedtime.    [provider]  Ipratropium-Albuterol (COMBIVENT) 20-100 MCG/ACT AERS respimat Inhale 1 puff into the lungs every 6 (six) hours. Patient taking differently: Inhale 1 puff into the lungs every 6 (six) hours as needed for wheezing or shortness of breath. 11/05/20   Regalado, Belkys A, MD  LORazepam (ATIVAN) 0.5 MG tablet Take 0.5 mg by mouth every 8 (eight) hours as  needed for anxiety.    [provider]  metoprolol tartrate (LOPRESSOR) 50 MG tablet Take 50 mg by mouth 2 (two) times daily.    [provider]  mirabegron ER (MYRBETRIQ) 50 MG TB24 tablet Take 50 mg by mouth daily.     [provider]  nitroGLYCERIN (NITROSTAT) 0.4 MG SL tablet Place 1 tablet (0.4 mg total) under the tongue every 5 (five) minutes x 3 doses as needed for chest pain. 08/16/17   Rinaldo Cloud, MD  Propylene Glycol 0.6 % SOLN Place 2 drops into both eyes daily as needed (for dry eyes).     [provider]  risperiDONE (RISPERDAL) 1 MG tablet Take 1 mg by mouth at bedtime.    [provider]  traMADol (ULTRAM) 50 MG tablet Take 1 tablet (50 mg total) by mouth 2 (two) times daily as needed for severe pain. 05/17/21   Antony Madura, PA-C  vitamin B-12 1000 MCG tablet Take 1 tablet (1,000 mcg total) by mouth daily. 11/06/20   Regalado, Belkys A, MD  vitamin C (ASCORBIC ACID) 500 MG tablet Take 500 mg by mouth daily.    [provider]      Allergies    Patient has no known allergies.    Review of Systems   Review of Systems  Physical Exam Updated Vital Signs BP (!) 121/46   Pulse 74   Temp  97.8 F (36.6 C) (Oral)   Resp 18   SpO2 96%  Physical Exam Vitals and nursing note reviewed.  Constitutional:      General: She is not in acute distress.    Appearance: She is well-developed.  HENT:     Head: Normocephalic and atraumatic.     Right Ear: External ear normal.     Left Ear: External ear normal.     Nose: Nose normal.  Eyes:     Extraocular Movements: Extraocular movements intact.     Conjunctiva/sclera: Conjunctivae normal.     Pupils: Pupils are equal, round, and reactive to light.  Pulmonary:     Effort: Pulmonary effort is normal. No respiratory distress.  Musculoskeletal:     Cervical back: Normal range of motion and neck supple.     Right lower leg: No edema.     Left lower leg: No edema.     Comments:  Compartments soft of bilateral lower extremities.  Mild tenderness to palpation of mid to distal fibula.  No tenderness to palpation of foot otherwise.  Bilateral lower extremities with 3+ edema.  Skin:    General: Skin is warm and dry.  Neurological:     Mental Status: She is alert and oriented to person, place, and time. Mental status is at baseline.  Psychiatric:        Mood and Affect: Mood normal.     ED Results / Procedures / Treatments   Labs (all labs ordered are listed, but only abnormal results are displayed) Labs Reviewed - No data to display  EKG None  Radiology No results found.  Procedures Procedures    Medications Ordered in ED Medications - No data to display  ED Course/ Medical Decision Making/ A&P                             Medical Decision Making  Marilyn Sexton is a 77 y.o. female with comorbidities that complicate the patient evaluation including hypertension, diabetes, and recent right ankle sprain who presents to the emergency department with right lower extremity pain after twisting her ankle  Initial Ddx:  Ankle sprain, fracture, soft tissue swelling, compartment syndrome  MDM:  Patient may be having persistent pain from her ankle sprain especially since she was walking around on her ankle more yesterday and has not been taking pain medication that she was taking previously.  Her tenderness to palpation is in the mid distal fibula several centimeters superior to the ankle joint.  I did personally review the x-rays that were taken and did not see any evidence of fracture and would be rare for an occult fracture in this location.  No signs of compartment syndrome with appears warm and well-perfused otherwise.  Plan:  Reassurance Weightbearing as tolerated  ED Summary/Re-evaluation:  Encouraged patient to continue to wear splint and bear weight as tolerated.  May be beneficial for her to use a wheelchair for the next few days until her ankle  pain improves.  Instructed her to continue the pain medication that she was taking before.  This patient presents to the ED for concern of complaints listed in HPI, this involves an extensive number of treatment options, and is a complaint that carries with it a high risk of complications and morbidity. Disposition including potential need for admission considered.   Dispo: DC Home. Return precautions discussed including, but not limited to, those listed in the AVS. Allowed pt  time to ask questions which were answered fully prior to dc.  Additional history obtained from spouse Records reviewed Outpatient Clinic Notes and ED Visit Notes I independently reviewed the following imaging with scope of interpretation limited to determining acute life threatening conditions related to emergency care: Extremity x-ray(s) and agree with the radiologist interpretation with the following exceptions: none I have reviewed the patients home medications and made adjustments as needed Social Determinants of health:  Elderly  Final Clinical Impression(s) / ED Diagnoses Final diagnoses:  Sprain of right ankle, unspecified ligament, subsequent encounter    Rx / DC Orders ED Discharge Orders     None         Rondel Baton, MD 07/06/22 [phone]

## 2022-07-06 NOTE — ED Notes (Signed)
States wore her splint for 2 days last week as instructed by [facility] ER doc. States hasn't been wearing since, c/o worsening pain to foot and up lower leg. Bilateral leg swelling noted, no bruising. Able to move toes without difficulty.

## 2023-07-17 NOTE — Progress Notes (Unsigned)
 Cardiology Office Note:    Date:  07/20/2023   ID:  Marilyn Sexton, DOB [birth year], MRN [id number]  PCP:  Rosslyn Coons, MD   [facility] Providers Cardiologist:  None     Referring MD: Chodri, Tanvir, MD   Chief Complaint  Patient presents with   Edema   Coronary Artery Disease    History of Present Illness:    Marilyn Sexton is a 78 y.o. female seen at the request of Dr Corita Diego for evaluation of CAD and HTN. She has a history of HTN, DM on insulin , thyroid  disease. Previously followed by Dr Glena Landau. S/p remote MI in 2012. Underwent stenting of proximal to mid RCA with DES x 1. Last Myoview  in 2018 was normal.   I take care of her husband. She states she is doing well from a cardiac standpoint. Denies any chest pain or dyspnea. Does use an inhaler for COPD. Quit smoking 25 years ago. Not currently on lipid lowering therapy. Does have some chronic LE swelling- had prior ankle fracture on the left.   Past Medical History:  Diagnosis Date   Arthritis    Diabetes mellitus without complication (HCC)    Dyspnea    Hypertension    Myocardial infarction [facility])    Thyroid  disease     Past Surgical History:  Procedure Laterality Date   ABDOMINAL AORTOGRAM W/LOWER EXTREMITY N/A 12/19/2020   Procedure: ABDOMINAL AORTOGRAM W/LOWER EXTREMITY;  Surgeon: Young Hensen, MD;  Location: [facility] INVASIVE CV LAB;  Service: Cardiovascular;  Laterality: N/A;   CARDIAC CATHETERIZATION     CHOLECYSTECTOMY     coronary stents     EYE SURGERY     bilateral cataracts   KNEE ARTHROSCOPY Right 12/16/2016   Procedure: RIGHT KNEE ARTHROSCOPY AND DEBRIDEMENT;  Surgeon: Timothy Ford, MD;  Location: [facility] OR;  Service: Orthopedics;  Laterality: Right;   TOTAL KNEE ARTHROPLASTY Right 12/31/2017   Procedure: RIGHT TOTAL KNEE ARTHROPLASTY;  Surgeon: Neil Balls, MD;  Location: [facility] ORS;  Service: Orthopedics;  Laterality: Right;  Adductor Block   TUBAL LIGATION      Current  Medications: Current Meds  Medication Sig   albuterol  (PROVENTIL ) (2.5 MG/3ML) 0.083% nebulizer solution Take 2.5 mg by nebulization as needed.   amLODipine  (NORVASC ) 5 MG tablet Take 5 mg by mouth daily.   aspirin  EC 81 MG tablet Take 81 mg by mouth daily. Swallow whole.   benazepril  (LOTENSIN ) 20 MG tablet Take 20 mg by mouth 2 (two) times daily.   Doxepin  HCl 3 MG TABS Take 3 mg by mouth at bedtime.   insulin  lispro protamine-lispro (HUMALOG MIX 75/25) (75-25) 100 UNIT/ML SUSP injection Inject 18 Units into the skin in the morning and at bedtime.   Ipratropium-Albuterol  (COMBIVENT) 20-100 MCG/ACT AERS respimat Inhale 1 puff into the lungs every 6 (six) hours. (Patient taking differently: Inhale 1 puff into the lungs every 6 (six) hours as needed for wheezing or shortness of breath.)   LORazepam  (ATIVAN ) 0.5 MG tablet Take 0.5 mg by mouth every 8 (eight) hours as needed for anxiety.   metoprolol  tartrate (LOPRESSOR ) 50 MG tablet Take 50 mg by mouth 2 (two) times daily.   mirabegron  ER (MYRBETRIQ ) 50 MG TB24 tablet Take 50 mg by mouth daily.    nitroGLYCERIN  (NITROSTAT ) 0.4 MG SL tablet Place 1 tablet (0.4 mg total) under the tongue every 5 (five) minutes x 3 doses as needed for chest pain.   Propylene Glycol 0.6 % SOLN Place 2  drops into both eyes daily as needed (for dry eyes).    risperiDONE  (RISPERDAL ) 1 MG tablet Take 1 mg by mouth at bedtime.   traMADol  (ULTRAM ) 50 MG tablet Take 1 tablet (50 mg total) by mouth 2 (two) times daily as needed for severe pain.   vitamin B-12 1000 MCG tablet Take 1 tablet (1,000 mcg total) by mouth daily.   vitamin C (ASCORBIC ACID) 500 MG tablet Take 500 mg by mouth daily.     Allergies:   Patient has no known allergies.   Social History   Socioeconomic History   Marital status: Married    Spouse name: Not on file   Number of children: Not on file   Years of education: Not on file   Highest education level: Not on file  Occupational History   Not  on file  Tobacco Use   Smoking status: Former    Current packs/day: 0.00    Average packs/day: 1 pack/day for 44.0 years (44.0 ttl pk-yrs)    Types: Cigarettes    Start date: 44    Quit date: 2006    Years since quitting: 19.3   Smokeless tobacco: Never  Vaping Use   Vaping status: Never Used  Substance and Sexual Activity   Alcohol use: No   Drug use: No   Sexual activity: Not on file  Other Topics Concern   Not on file  Social History Narrative   Not on file   Social Drivers of Health   Financial Resource Strain: Not on file  Food Insecurity: Not on file  Transportation Needs: Not on file  Physical Activity: Not on file  Stress: Not on file  Social Connections: Not on file     Family History: The patient's family history is not on file.  ROS:   Please see the history of present illness.     All other systems reviewed and are negative.  EKGs/Labs/Other Studies Reviewed:    The following studies were reviewed today: EKG Interpretation Date/Time:  Tuesday Jul 20 2023 14:45:01 EDT Ventricular Rate:  69 PR Interval:    QRS Duration:  92 QT Interval:  188 QTC Calculation: 202 R Axis:   -9  Text Interpretation: Normal sinus rhythm Low voltage QRS Incomplete right bundle branch block Inferior infarct (cited on or before 20-Jul-2023) Cannot rule out Anterior infarct (cited on or before 20-Jul-2023) When compared with ECG of August 2022 HR is slower. Confirmed by [place], Marcellina Jonsson [phone]) on 07/20/2023 2:49:35 PM   EKG Interpretation Date/Time:  Tuesday Jul 20 2023 14:45:01 EDT Ventricular Rate:  69 PR Interval:    QRS Duration:  92 QT Interval:  188 QTC Calculation: 202 R Axis:   -9  Text Interpretation: Normal sinus rhythm Low voltage QRS Incomplete right bundle branch block Inferior infarct (cited on or before 20-Jul-2023) Cannot rule out Anterior infarct (cited on or before 20-Jul-2023) When compared with ECG of August 2022 HR is slower. Confirmed by [place],  Skarlet Lyons [phone]) on 07/20/2023 2:49:35 PM    Recent Labs: No results found for requested labs within last 365 days.  Recent Lipid Panel    Component Value Date/Time   CHOL 123 08/16/2017 0613   TRIG 201 (H) 08/16/2017 0613   HDL 41 08/16/2017 0613   CHOLHDL 3.0 08/16/2017 0613   VLDL 40 08/16/2017 0613   LDLCALC 42 08/16/2017 0613     Risk Assessment/Calculations:                Physical  Exam:    VS:  BP 120/64 (BP Location: Left Arm, Patient Position: Sitting, Cuff Size: Normal)   Pulse 69   Ht 5\' 5"  (1.651 m)   Wt 211 lb 14.4 oz (96.1 kg)   SpO2 94%   BMI 35.26 kg/m     Wt Readings from Last 3 Encounters:  07/20/23 211 lb 14.4 oz (96.1 kg)  10/29/21 196 lb (88.9 kg)  12/19/20 200 lb (90.7 kg)     GEN:  Well nourished, obese in no acute distress HEENT: Normal NECK: No JVD; No carotid bruits LYMPHATICS: No lymphadenopathy CARDIAC: RRR, no murmurs, rubs, gallops RESPIRATORY:  Clear to auscultation without rales, wheezing or rhonchi  ABDOMEN: Soft, non-tender, non-distended MUSCULOSKELETAL:  No edema; No deformity, mild ankle edema on left SKIN: Warm and dry NEUROLOGIC:  Alert and oriented x 3 PSYCHIATRIC:  Normal affect   ASSESSMENT:    1. Coronary artery disease of native artery of native heart with stable angina pectoris (HCC)   2. Hypertension, unspecified type   3. Type 2 diabetes mellitus without complication, with long-term current use of insulin  [facility])    PLAN:    In order of problems listed above:  CAD remote MI in 2012 with stenting of the RCA. Normal Myoview  in 2018. No active cardiac symptoms. Continue medical therapy with amlodipine , metoprolol , ASA.  DM on insulin  HLD. No recent lipid levels done. Not on Rx for reasons that are not clear. Will check labs. Likely start statin.  LE edema. Recommend sodium restriction and compression hose.  Remote tobacco use COPD.            Medication Adjustments/Labs and Tests Ordered: Current medicines  are reviewed at length with the patient today.  Concerns regarding medicines are outlined above.  Orders Placed This Encounter  Procedures   Lipid panel   Comp Met (CMET)   CBC w/Diff   EKG 12-Lead   EKG 12-Lead   No orders of the defined types were placed in this encounter.   Patient Instructions  Medication Instructions:  Continue all medications *If you need a refill on your cardiac medications before your next appointment, please call your pharmacy*  Lab Work: Cmet,lipid panel and cbc today  Testing/Procedures: None ordered  Follow-Up: At [facility], you and your health needs are our priority.  As part of our continuing mission to provide you with exceptional heart care, our providers are all part of one team.  This team includes your primary Cardiologist (physician) and Advanced Practice Providers or APPs (Physician Assistants and Nurse Practitioners) who all work together to provide you with the care you need, when you need it.  Your next appointment:  6 months   Call in July to schedule Nov appointment     Provider:  Dr.Leilan Bochenek   We recommend signing up for the patient portal called "MyChart".  Sign up information is provided on this After Visit Summary.  MyChart is used to connect with patients for Virtual Visits (Telemedicine).  Patients are able to view lab/test results, encounter notes, upcoming appointments, etc.  Non-urgent messages can be sent to your provider as well.   To learn more about what you can do with MyChart, go to [email or web address].          Signed, Samarie Pinder [place], MD  07/20/2023 3:05 PM    [facility]

## 2023-07-20 ENCOUNTER — Encounter: Payer: Self-pay | Admitting: Cardiology

## 2023-07-20 ENCOUNTER — Ambulatory Visit: Attending: Cardiology | Admitting: Cardiology

## 2023-07-20 VITALS — BP 120/64 | HR 69 | Ht 65.0 in | Wt 211.9 lb

## 2023-07-20 DIAGNOSIS — Z794 Long term (current) use of insulin: Secondary | ICD-10-CM

## 2023-07-20 DIAGNOSIS — E119 Type 2 diabetes mellitus without complications: Secondary | ICD-10-CM | POA: Diagnosis not present

## 2023-07-20 DIAGNOSIS — I10 Essential (primary) hypertension: Secondary | ICD-10-CM

## 2023-07-20 DIAGNOSIS — I25118 Atherosclerotic heart disease of native coronary artery with other forms of angina pectoris: Secondary | ICD-10-CM | POA: Diagnosis not present

## 2023-07-20 LAB — LIPID PANEL

## 2023-07-20 LAB — CBC WITH DIFFERENTIAL/PLATELET

## 2023-07-20 NOTE — Patient Instructions (Signed)
 Medication Instructions:  Continue all medications *If you need a refill on your cardiac medications before your next appointment, please call your pharmacy*  Lab Work: Cmet,lipid panel and cbc today  Testing/Procedures: None ordered  Follow-Up: At [facility], you and your health needs are our priority.  As part of our continuing mission to provide you with exceptional heart care, our providers are all part of one team.  This team includes your primary Cardiologist (physician) and Advanced Practice Providers or APPs (Physician Assistants and Nurse Practitioners) who all work together to provide you with the care you need, when you need it.  Your next appointment:  6 months   Call in July to schedule Nov appointment     Provider:  Dr.Jordan   We recommend signing up for the patient portal called "MyChart".  Sign up information is provided on this After Visit Summary.  MyChart is used to connect with patients for Virtual Visits (Telemedicine).  Patients are able to view lab/test results, encounter notes, upcoming appointments, etc.  Non-urgent messages can be sent to your provider as well.   To learn more about what you can do with MyChart, go to [email or web address].

## 2023-07-21 ENCOUNTER — Other Ambulatory Visit: Payer: Self-pay

## 2023-07-21 ENCOUNTER — Ambulatory Visit: Payer: Self-pay | Admitting: Cardiology

## 2023-07-21 LAB — CBC WITH DIFFERENTIAL/PLATELET
Basos: 0 %
EOS (ABSOLUTE): 0 10*3/uL (ref 0.0–0.2)
Eos: 1 %
Hematocrit: 33.5 % — ABNORMAL LOW (ref 34.0–46.6)
Hemoglobin: 11.6 g/dL (ref 11.1–15.9)
Immature Granulocytes: 0.1 10*3/uL (ref 0.0–0.1)
Immature Granulocytes: 1 %
Lymphs: 29 %
MCH: 34.7 pg — ABNORMAL HIGH (ref 26.6–33.0)
MCHC: 34.6 g/dL (ref 31.5–35.7)
MCV: 100 fL — ABNORMAL HIGH (ref 79–97)
Monocytes Absolute: 0.1 10*3/uL (ref 0.0–0.4)
Monocytes Absolute: 1 10*3/uL — ABNORMAL HIGH (ref 0.1–0.9)
Monocytes: 10 %
Neutrophils Absolute: 2.7 10*3/uL (ref 0.7–3.1)
Neutrophils Absolute: 5.5 10*3/uL (ref 1.4–7.0)
Neutrophils: 59 %
Platelets: 262 10*3/uL (ref 150–450)
RBC: 3.34 x10E6/uL — ABNORMAL LOW (ref 3.77–5.28)
RDW: 16.1 % — ABNORMAL HIGH (ref 11.7–15.4)
WBC: 9.3 10*3/uL (ref 3.4–10.8)

## 2023-07-21 LAB — COMPREHENSIVE METABOLIC PANEL WITH GFR
ALT: 9 IU/L (ref 0–32)
AST: 12 IU/L (ref 0–40)
Albumin: 4.4 g/dL (ref 3.8–4.8)
Alkaline Phosphatase: 102 IU/L (ref 44–121)
BUN/Creatinine Ratio: 17 (ref 12–28)
BUN: 21 mg/dL (ref 8–27)
Bilirubin Total: 0.5 mg/dL (ref 0.0–1.2)
CO2: 23 mmol/L (ref 20–29)
Calcium: 9.5 mg/dL (ref 8.7–10.3)
Chloride: 96 mmol/L (ref 96–106)
Creatinine, Ser: 1.23 mg/dL — ABNORMAL HIGH (ref 0.57–1.00)
Globulin, Total: 1.8 g/dL (ref 1.5–4.5)
Glucose: 122 mg/dL — ABNORMAL HIGH (ref 70–99)
Potassium: 5 mmol/L (ref 3.5–5.2)
Sodium: 133 mmol/L — ABNORMAL LOW (ref 134–144)
Total Protein: 6.2 g/dL (ref 6.0–8.5)
eGFR: 46 mL/min/{1.73_m2} — ABNORMAL LOW (ref >59)

## 2023-07-21 LAB — LIPID PANEL
Cholesterol, Total: 180 mg/dL (ref 100–199)
HDL: 55 mg/dL (ref >39)
LDL CALC COMMENT:: 3.3 ratio (ref 0.0–4.4)
LDL Chol Calc (NIH): 107 mg/dL — ABNORMAL HIGH (ref 0–99)
Triglycerides: 101 mg/dL (ref 0–149)
VLDL Cholesterol Cal: 18 mg/dL (ref 5–40)

## 2023-07-21 MED ORDER — ROSUVASTATIN CALCIUM 10 MG PO TABS: 10.0000 mg | ORAL_TABLET | Freq: Every day | ORAL | 3 refills | Status: AC

## 2023-07-23 ENCOUNTER — Other Ambulatory Visit: Payer: Self-pay

## 2023-07-23 DIAGNOSIS — I25118 Atherosclerotic heart disease of native coronary artery with other forms of angina pectoris: Secondary | ICD-10-CM

## 2023-08-31 LAB — LAB REPORT - SCANNED: A1c: 5

## 2023-09-01 ENCOUNTER — Encounter: Payer: Self-pay | Admitting: Student

## 2023-10-20 ENCOUNTER — Ambulatory Visit: Payer: Self-pay | Admitting: Cardiology

## 2023-10-20 LAB — LIPID PANEL
Chol/HDL Ratio: 2.5 ratio (ref 0.0–4.4)
Cholesterol, Total: 109 mg/dL (ref 100–199)
HDL: 43 mg/dL (ref >39)
LDL Chol Calc (NIH): 48 mg/dL (ref 0–99)
Triglycerides: 92 mg/dL (ref 0–149)
VLDL Cholesterol Cal: 18 mg/dL (ref 5–40)

## 2023-10-20 LAB — HEPATIC FUNCTION PANEL
ALT: 6 IU/L (ref 0–32)
AST: 14 IU/L (ref 0–40)
Albumin: 4.1 g/dL (ref 3.8–4.8)
Alkaline Phosphatase: 76 IU/L (ref 44–121)
Bilirubin Total: 0.6 mg/dL (ref 0.0–1.2)
Bilirubin, Direct: 0.22 mg/dL (ref 0.00–0.40)
Total Protein: 6 g/dL (ref 6.0–8.5)

## 2023-11-18 ENCOUNTER — Other Ambulatory Visit: Payer: Self-pay | Admitting: Nephrology

## 2023-11-18 DIAGNOSIS — N1831 Chronic kidney disease, stage 3a: Secondary | ICD-10-CM

## 2023-12-16 ENCOUNTER — Encounter: Admitting: Hematology and Oncology

## 2023-12-16 ENCOUNTER — Other Ambulatory Visit

## 2023-12-21 ENCOUNTER — Ambulatory Visit
Admission: RE | Admit: 2023-12-21 | Discharge: 2023-12-21 | Disposition: A | Discharge status: Home / Self Care | Source: Ambulatory Visit | Attending: Nephrology | Admitting: Nephrology

## 2023-12-21 DIAGNOSIS — N1831 Chronic kidney disease, stage 3a: Secondary | ICD-10-CM

## 2023-12-25 ENCOUNTER — Other Ambulatory Visit: Payer: Self-pay

## 2023-12-25 ENCOUNTER — Encounter (HOSPITAL_COMMUNITY): Payer: Self-pay

## 2023-12-25 ENCOUNTER — Emergency Department (HOSPITAL_COMMUNITY)

## 2023-12-25 ENCOUNTER — Inpatient Hospital Stay (HOSPITAL_COMMUNITY)
Admission: EM | Admit: 2023-12-25 | Discharge: 2023-12-28 | DRG: 871 | Disposition: A | Discharge status: Home / Self Care | Attending: Internal Medicine | Admitting: Internal Medicine

## 2023-12-25 DIAGNOSIS — N3289 Other specified disorders of bladder: Secondary | ICD-10-CM | POA: Diagnosis present

## 2023-12-25 DIAGNOSIS — Z66 Do not resuscitate: Secondary | ICD-10-CM | POA: Diagnosis present

## 2023-12-25 DIAGNOSIS — I2510 Atherosclerotic heart disease of native coronary artery without angina pectoris: Secondary | ICD-10-CM | POA: Diagnosis present

## 2023-12-25 DIAGNOSIS — J188 Other pneumonia, unspecified organism: Principal | ICD-10-CM

## 2023-12-25 DIAGNOSIS — I10 Essential (primary) hypertension: Secondary | ICD-10-CM | POA: Diagnosis not present

## 2023-12-25 DIAGNOSIS — I129 Hypertensive chronic kidney disease with stage 1 through stage 4 chronic kidney disease, or unspecified chronic kidney disease: Secondary | ICD-10-CM | POA: Diagnosis present

## 2023-12-25 DIAGNOSIS — F17210 Nicotine dependence, cigarettes, uncomplicated: Secondary | ICD-10-CM | POA: Diagnosis present

## 2023-12-25 DIAGNOSIS — E785 Hyperlipidemia, unspecified: Secondary | ICD-10-CM | POA: Diagnosis present

## 2023-12-25 DIAGNOSIS — A419 Sepsis, unspecified organism: Principal | ICD-10-CM | POA: Diagnosis present

## 2023-12-25 DIAGNOSIS — R7989 Other specified abnormal findings of blood chemistry: Secondary | ICD-10-CM

## 2023-12-25 DIAGNOSIS — Z1152 Encounter for screening for COVID-19: Secondary | ICD-10-CM | POA: Diagnosis not present

## 2023-12-25 DIAGNOSIS — E669 Obesity, unspecified: Secondary | ICD-10-CM | POA: Diagnosis present

## 2023-12-25 DIAGNOSIS — I252 Old myocardial infarction: Secondary | ICD-10-CM | POA: Diagnosis not present

## 2023-12-25 DIAGNOSIS — M79661 Pain in right lower leg: Secondary | ICD-10-CM

## 2023-12-25 DIAGNOSIS — J441 Chronic obstructive pulmonary disease with (acute) exacerbation: Secondary | ICD-10-CM | POA: Diagnosis present

## 2023-12-25 DIAGNOSIS — Z7982 Long term (current) use of aspirin: Secondary | ICD-10-CM | POA: Diagnosis not present

## 2023-12-25 DIAGNOSIS — F319 Bipolar disorder, unspecified: Secondary | ICD-10-CM | POA: Diagnosis present

## 2023-12-25 DIAGNOSIS — E1122 Type 2 diabetes mellitus with diabetic chronic kidney disease: Secondary | ICD-10-CM | POA: Diagnosis present

## 2023-12-25 DIAGNOSIS — R531 Weakness: Secondary | ICD-10-CM

## 2023-12-25 DIAGNOSIS — Z6835 Body mass index (BMI) 35.0-35.9, adult: Secondary | ICD-10-CM

## 2023-12-25 DIAGNOSIS — J189 Pneumonia, unspecified organism: Secondary | ICD-10-CM | POA: Diagnosis present

## 2023-12-25 DIAGNOSIS — Z79899 Other long term (current) drug therapy: Secondary | ICD-10-CM

## 2023-12-25 DIAGNOSIS — E860 Dehydration: Secondary | ICD-10-CM | POA: Diagnosis present

## 2023-12-25 DIAGNOSIS — N1832 Chronic kidney disease, stage 3b: Secondary | ICD-10-CM | POA: Diagnosis present

## 2023-12-25 DIAGNOSIS — N179 Acute kidney failure, unspecified: Secondary | ICD-10-CM | POA: Diagnosis present

## 2023-12-25 DIAGNOSIS — E1165 Type 2 diabetes mellitus with hyperglycemia: Secondary | ICD-10-CM | POA: Diagnosis present

## 2023-12-25 DIAGNOSIS — J440 Chronic obstructive pulmonary disease with acute lower respiratory infection: Secondary | ICD-10-CM | POA: Diagnosis present

## 2023-12-25 DIAGNOSIS — Z794 Long term (current) use of insulin: Secondary | ICD-10-CM | POA: Diagnosis not present

## 2023-12-25 DIAGNOSIS — Z955 Presence of coronary angioplasty implant and graft: Secondary | ICD-10-CM

## 2023-12-25 DIAGNOSIS — Z96651 Presence of right artificial knee joint: Secondary | ICD-10-CM | POA: Diagnosis present

## 2023-12-25 DIAGNOSIS — E872 Acidosis, unspecified: Secondary | ICD-10-CM | POA: Diagnosis present

## 2023-12-25 DIAGNOSIS — E119 Type 2 diabetes mellitus without complications: Secondary | ICD-10-CM

## 2023-12-25 LAB — URINALYSIS, W/ REFLEX TO CULTURE (INFECTION SUSPECTED)
Bacteria, UA: NONE SEEN
Bilirubin Urine: NEGATIVE
Glucose, UA: >=500 mg/dL — AB
Hgb urine dipstick: NEGATIVE
Ketones, ur: NEGATIVE mg/dL
Leukocytes,Ua: NEGATIVE
Nitrite: NEGATIVE
Protein, ur: NEGATIVE mg/dL
Specific Gravity, Urine: 1.022 (ref 1.005–1.030)
pH: 5 (ref 5.0–8.0)

## 2023-12-25 LAB — RESPIRATORY PANEL BY PCR

## 2023-12-25 LAB — RESP PANEL BY RT-PCR (RSV, FLU A&B, COVID)  RVPGX2
Influenza A by PCR: NEGATIVE
Influenza B by PCR: NEGATIVE
Resp Syncytial Virus by PCR: NEGATIVE
SARS Coronavirus 2 by RT PCR: NEGATIVE

## 2023-12-25 LAB — BASIC METABOLIC PANEL WITH GFR
Anion gap: 15 (ref 5–15)
BUN: 28 mg/dL — ABNORMAL HIGH (ref 8–23)
CO2: 21 mmol/L — ABNORMAL LOW (ref 22–32)
Calcium: 8.6 mg/dL — ABNORMAL LOW (ref 8.9–10.3)
Chloride: 100 mmol/L (ref 98–111)
Creatinine, Ser: 1.83 mg/dL — ABNORMAL HIGH (ref 0.44–1.00)
GFR, Estimated: 28 mL/min — ABNORMAL LOW (ref >60)
Glucose, Bld: 252 mg/dL — ABNORMAL HIGH (ref 70–99)
Potassium: 3.6 mmol/L (ref 3.5–5.1)
Sodium: 136 mmol/L (ref 135–145)

## 2023-12-25 LAB — TROPONIN I (HIGH SENSITIVITY)
Troponin I (High Sensitivity): 32 ng/L — ABNORMAL HIGH (ref <18)
Troponin I (High Sensitivity): 27 ng/L — ABNORMAL HIGH (ref <18)

## 2023-12-25 LAB — HEPATIC FUNCTION PANEL
ALT: 10 U/L (ref 0–44)
AST: 15 U/L (ref 15–41)
Albumin: 3 g/dL — ABNORMAL LOW (ref 3.5–5.0)
Alkaline Phosphatase: 66 U/L (ref 38–126)
Bilirubin, Direct: 0.4 mg/dL — ABNORMAL HIGH (ref 0.0–0.2)
Indirect Bilirubin: 1 mg/dL — ABNORMAL HIGH (ref 0.3–0.9)
Total Bilirubin: 1.4 mg/dL — ABNORMAL HIGH (ref 0.0–1.2)
Total Protein: 6.1 g/dL — ABNORMAL LOW (ref 6.5–8.1)

## 2023-12-25 LAB — CBG MONITORING, ED: Glucose-Capillary: 211 mg/dL — ABNORMAL HIGH (ref 70–99)

## 2023-12-25 LAB — CBC
HCT: 31.1 % — ABNORMAL LOW (ref 36.0–46.0)
Hemoglobin: 10.4 g/dL — ABNORMAL LOW (ref 12.0–15.0)
MCH: 36.1 pg — ABNORMAL HIGH (ref 26.0–34.0)
MCHC: 33.4 g/dL (ref 30.0–36.0)
MCV: 108 fL — ABNORMAL HIGH (ref 80.0–100.0)
Platelets: 261 K/uL (ref 150–400)
RBC: 2.88 MIL/uL — ABNORMAL LOW (ref 3.87–5.11)
RDW: 16.7 % — ABNORMAL HIGH (ref 11.5–15.5)
WBC: 17.9 K/uL — ABNORMAL HIGH (ref 4.0–10.5)
nRBC: 0 % (ref 0.0–0.2)

## 2023-12-25 LAB — I-STAT CG4 LACTIC ACID, ED
Lactic Acid, Venous: 2 mmol/L (ref 0.5–1.9)
Lactic Acid, Venous: 0.8 mmol/L (ref 0.5–1.9)

## 2023-12-25 LAB — D-DIMER, QUANTITATIVE: D-Dimer, Quant: 1.02 ug{FEU}/mL — ABNORMAL HIGH (ref 0.00–0.50)

## 2023-12-25 LAB — BRAIN NATRIURETIC PEPTIDE: B Natriuretic Peptide: 192.3 pg/mL — ABNORMAL HIGH (ref 0.0–100.0)

## 2023-12-25 MED ORDER — DM-GUAIFENESIN ER 30-600 MG PO TB12
1.0000 | ORAL_TABLET | Freq: Two times a day (BID) | ORAL | Status: DC
  Administered 2023-12-26 – 2023-12-28 (×5): 1 via ORAL
  Filled 2023-12-25 (×9): qty 1

## 2023-12-25 MED ORDER — INSULIN ASPART 100 UNIT/ML IJ SOLN
0.0000 [IU] | Freq: Three times a day (TID) | INTRAMUSCULAR | Status: DC
  Administered 2023-12-26: 3 [IU] via SUBCUTANEOUS
  Administered 2023-12-26 – 2023-12-27 (×2): 2 [IU] via SUBCUTANEOUS
  Administered 2023-12-27: 3 [IU] via SUBCUTANEOUS
  Administered 2023-12-27: 2 [IU] via SUBCUTANEOUS

## 2023-12-25 MED ORDER — ACETAMINOPHEN 325 MG PO TABS: 650.0000 mg | ORAL_TABLET | Freq: Four times a day (QID) | ORAL | Status: DC | PRN

## 2023-12-25 MED ORDER — SENNOSIDES-DOCUSATE SODIUM 8.6-50 MG PO TABS: 1.0000 | ORAL_TABLET | Freq: Every evening | ORAL | Status: DC | PRN

## 2023-12-25 MED ORDER — METHYLPREDNISOLONE SODIUM SUCC 125 MG IJ SOLR
125.0000 mg | Freq: Once | INTRAMUSCULAR | Status: AC
  Administered 2023-12-25: 125 mg via INTRAVENOUS
  Filled 2023-12-25: qty 2

## 2023-12-25 MED ORDER — ACETAMINOPHEN 650 MG RE SUPP: 650.0000 mg | Freq: Four times a day (QID) | RECTAL | Status: DC | PRN

## 2023-12-25 MED ORDER — SODIUM CHLORIDE 0.9 % IV SOLN
500.0000 mg | Freq: Once | INTRAVENOUS | Status: AC
  Administered 2023-12-25: 500 mg via INTRAVENOUS
  Filled 2023-12-25: qty 5

## 2023-12-25 MED ORDER — ONDANSETRON HCL 4 MG PO TABS: 4.0000 mg | ORAL_TABLET | Freq: Four times a day (QID) | ORAL | Status: DC | PRN

## 2023-12-25 MED ORDER — ONDANSETRON HCL 4 MG/2ML IJ SOLN: 4.0000 mg | Freq: Four times a day (QID) | INTRAMUSCULAR | Status: DC | PRN

## 2023-12-25 MED ORDER — OXYCODONE-ACETAMINOPHEN 5-325 MG PO TABS
1.0000 | ORAL_TABLET | Freq: Once | ORAL | Status: AC
  Administered 2023-12-25: 1 via ORAL
  Filled 2023-12-25: qty 1

## 2023-12-25 MED ORDER — HEPARIN SODIUM (PORCINE) 5000 UNIT/ML IJ SOLN
5000.0000 [IU] | Freq: Three times a day (TID) | INTRAMUSCULAR | Status: DC
  Administered 2023-12-25 – 2023-12-28 (×8): 5000 [IU] via SUBCUTANEOUS
  Filled 2023-12-25 (×8): qty 1

## 2023-12-25 MED ORDER — BISACODYL 5 MG PO TBEC: 5.0000 mg | DELAYED_RELEASE_TABLET | Freq: Every day | ORAL | Status: DC | PRN

## 2023-12-25 MED ORDER — INSULIN ASPART 100 UNIT/ML IJ SOLN
0.0000 [IU] | Freq: Every day | INTRAMUSCULAR | Status: DC
  Administered 2023-12-25: 2 [IU] via SUBCUTANEOUS

## 2023-12-25 MED ORDER — SODIUM CHLORIDE 0.9 % IV SOLN: INTRAVENOUS | Status: AC

## 2023-12-25 MED ORDER — SODIUM CHLORIDE 0.9 % IV SOLN
1.0000 g | Freq: Once | INTRAVENOUS | Status: AC
  Administered 2023-12-25: 1 g via INTRAVENOUS
  Filled 2023-12-25: qty 10

## 2023-12-25 MED ORDER — IPRATROPIUM-ALBUTEROL 0.5-2.5 (3) MG/3ML IN SOLN
3.0000 mL | Freq: Once | RESPIRATORY_TRACT | Status: AC
  Administered 2023-12-25: 3 mL via RESPIRATORY_TRACT
  Filled 2023-12-25: qty 3

## 2023-12-25 MED ORDER — SODIUM CHLORIDE 0.9 % IV BOLUS
500.0000 mL | Freq: Once | INTRAVENOUS | Status: AC
  Administered 2023-12-25: 500 mL via INTRAVENOUS

## 2023-12-25 MED ORDER — IPRATROPIUM-ALBUTEROL 0.5-2.5 (3) MG/3ML IN SOLN
3.0000 mL | Freq: Four times a day (QID) | RESPIRATORY_TRACT | Status: DC
  Administered 2023-12-25 – 2023-12-27 (×7): 3 mL via RESPIRATORY_TRACT
  Filled 2023-12-25 (×7): qty 3

## 2023-12-25 NOTE — ED Provider Notes (Signed)
 [place] [facility] Provider Note   CSN: [id number] Arrival date & time: 12/25/23  1102    Patient presents with: Abdominal Pain and Flank Pain   Marilyn Sexton is Sexton 78 y.o. female here for evaluation of multiple complaints.  She has been feeling poor over the last month or so.  She has had Sexton cough which was initially nonproductive then turned to yellow, brown and is now having dark black-red tinged sputum over the last 24 hours.  She has had pain in her bilateral lower legs.  Was seen by PCP for this Sexton few weeks ago.  She states she had Sexton test done which did not show anything.  She denies any recent falls or injuries.  She also admits to some left-sided abdominal pain.  States this has been intermittent for Sexton few months.  No fever, nausea, vomiting.  She states she has chronic loose stools however denies any bright red blood per rectum or any melena.  She denies any dysuria or hematuria.  States she has 1-2 episodes of watery stool daily.  She states she was on antibiotics about 3 weeks ago however feels worse.  She is not sure the name of the antibiotic.  She has some pain to the left side of her chest, nonexertional.  It is pleuritic in nature, as well as worse with movement.  She denies any prior history of DVT, PE.  Has Sexton history of COPD, she is not sure what medications she takes at home for this. States not cu    HPI     Prior to Admission medications   Medication Sig Start Date End Date Taking? Authorizing Provider  albuterol  (PROVENTIL ) (2.5 MG/3ML) 0.083% nebulizer solution Take 2.5 mg by nebulization as needed. 04/08/23   [provider]  amLODipine  (NORVASC ) 5 MG tablet Take 5 mg by mouth daily.    [provider]  aspirin  EC 81 MG tablet Take 81 mg by mouth daily. Swallow whole.    [provider]  benazepril  (LOTENSIN ) 20 MG tablet Take 20 mg by mouth 2 (two) times daily.    [provider]  Doxepin   HCl 3 MG TABS Take 3 mg by mouth at bedtime. 09/13/20   [provider]  insulin  lispro protamine-lispro (HUMALOG MIX 75/25) (75-25) 100 UNIT/ML SUSP injection Inject 18 Units into the skin in the morning and at bedtime.    [provider]  Ipratropium-Albuterol  (COMBIVENT) 20-100 MCG/ACT AERS respimat Inhale 1 puff into the lungs every 6 (six) hours. Patient taking differently: Inhale 1 puff into the lungs every 6 (six) hours as needed for wheezing or shortness of breath. 11/05/20   Regalado, Belkys A, MD  LORazepam  (ATIVAN ) 0.5 MG tablet Take 0.5 mg by mouth every 8 (eight) hours as needed for anxiety.    [provider]  metoprolol  tartrate (LOPRESSOR ) 50 MG tablet Take 50 mg by mouth 2 (two) times daily.    [provider]  mirabegron  ER (MYRBETRIQ ) 50 MG TB24 tablet Take 50 mg by mouth daily.     [provider]  nitroGLYCERIN  (NITROSTAT ) 0.4 MG SL tablet Place 1 tablet (0.4 mg total) under the tongue every 5 (five) minutes x 3 doses as needed for chest pain. 08/16/17   Levern Hutching, MD  Propylene Glycol 0.6 % SOLN Place 2 drops into both eyes daily as needed (for dry eyes).     [provider]  risperiDONE  (RISPERDAL ) 1 MG tablet  Take 1 mg by mouth at bedtime.    [provider]  rosuvastatin  (CRESTOR ) 10 MG tablet Take 1 tablet (10 mg total) by mouth daily. 07/21/23 10/19/23  [place], Peter M, MD  traMADol  (ULTRAM ) 50 MG tablet Take 1 tablet (50 mg total) by mouth 2 (two) times daily as needed for severe pain. 05/17/21   Keith Sor, Marilyn Sexton  vitamin B-12 1000 MCG tablet Take 1 tablet (1,000 mcg total) by mouth daily. 11/06/20   Regalado, Belkys A, MD  vitamin C (ASCORBIC ACID) 500 MG tablet Take 500 mg by mouth daily.    [provider]    Allergies: Patient has no known allergies.    Review of Systems  Constitutional:  Positive for activity change, appetite change and fatigue.  HENT: Negative.    Respiratory:  Positive for  cough and shortness of breath.   Cardiovascular:  Positive for chest pain.  Gastrointestinal:  Positive for abdominal distention, diarrhea and nausea. Negative for abdominal pain, anal bleeding, blood in stool, constipation, rectal pain and vomiting.  Genitourinary: Negative.   Musculoskeletal: Negative.   Skin: Negative.   Neurological:  Positive for weakness.  All other systems reviewed and are negative.   Updated Vital Signs BP (!) 152/49   Pulse 88   Temp 98 F (36.7 C) (Oral)   Resp 18   Ht 5' 5 (1.651 m)   Wt 91.2 kg   SpO2 98%   BMI 33.45 kg/m   Physical Exam Vitals and nursing note reviewed.  Constitutional:      General: She is not in acute distress.    Appearance: She is well-developed. She is not ill-appearing, toxic-appearing or diaphoretic.  HENT:     Head: Normocephalic and atraumatic.  Eyes:     Pupils: Pupils are equal, round, and reactive to light.  Cardiovascular:     Rate and Rhythm: Normal rate.     Pulses: Normal pulses.          Radial pulses are 2+ on the right side and 2+ on the left side.       Dorsalis pedis pulses are 2+ on the right side and 2+ on the left side.     Heart sounds: Normal heart sounds.  Pulmonary:     Effort: Pulmonary effort is normal. No respiratory distress.     Breath sounds: Normal breath sounds.     Comments: Course rhonchi, clears with cough. Diffuse expiratory wheeze Abdominal:     General: Bowel sounds are normal. There is no distension.     Palpations: Abdomen is soft.     Tenderness: There is abdominal tenderness. There is no right CVA tenderness, left CVA tenderness, guarding or rebound.     Comments: Soft, diffuse left abd pain  Musculoskeletal:        General: Normal range of motion.     Cervical back: Normal range of motion.  Skin:    General: Skin is warm and dry.  Neurological:     General: No focal deficit present.     Mental Status: She is alert and oriented to person, place, and time.     Cranial  Nerves: No cranial nerve deficit.     Motor: Weakness present.     Comments: Diffuse weakness, hand grip 4/5 bil  Psychiatric:        Mood and Affect: Mood normal.     (all labs ordered are listed, but only abnormal results are displayed) Labs Reviewed  BASIC METABOLIC PANEL WITH  GFR - Abnormal; Notable for the following components:      Result Value   CO2 21 (*)    Glucose, Bld 252 (*)    BUN 28 (*)    Creatinine, Ser 1.83 (*)    Calcium  8.6 (*)    GFR, Estimated 28 (*)    All other components within normal limits  CBC - Abnormal; Notable for the following components:   WBC 17.9 (*)    RBC 2.88 (*)    Hemoglobin 10.4 (*)    HCT 31.1 (*)    MCV 108.0 (*)    MCH 36.1 (*)    RDW 16.7 (*)    All other components within normal limits  HEPATIC FUNCTION PANEL - Abnormal; Notable for the following components:   Total Protein 6.1 (*)    Albumin 3.0 (*)    Total Bilirubin 1.4 (*)    Bilirubin, Direct 0.4 (*)    Indirect Bilirubin 1.0 (*)    All other components within normal limits  BRAIN NATRIURETIC PEPTIDE - Abnormal; Notable for the following components:   B Natriuretic Peptide 192.3 (*)    All other components within normal limits  D-DIMER, QUANTITATIVE - Abnormal; Notable for the following components:   D-Dimer, Quant 1.02 (*)    All other components within normal limits  URINALYSIS, W/ REFLEX TO CULTURE (INFECTION SUSPECTED) - Abnormal; Notable for the following components:   APPearance HAZY (*)    Glucose, UA >=500 (*)    All other components within normal limits  I-STAT CG4 LACTIC ACID, ED - Abnormal; Notable for the following components:   Lactic Acid, Venous 2.0 (*)    All other components within normal limits  TROPONIN I (HIGH SENSITIVITY) - Abnormal; Notable for the following components:   Troponin I (High Sensitivity) 32 (*)    All other components within normal limits  TROPONIN I (HIGH SENSITIVITY) - Abnormal; Notable for the following components:   Troponin I  (High Sensitivity) 27 (*)    All other components within normal limits  CULTURE, BLOOD (ROUTINE X 2)  CULTURE, BLOOD (ROUTINE X 2)  RESP PANEL BY RT-PCR (RSV, FLU Sexton&B, COVID)  [id number]  I-STAT CG4 LACTIC ACID, ED    EKG: None  Radiology: CT CHEST ABDOMEN PELVIS WO CONTRAST Result Date: 12/25/2023 CLINICAL DATA:  Flank pain, chest pain, left basilar pneumonia on x-ray EXAM: CT CHEST, ABDOMEN AND PELVIS WITHOUT CONTRAST TECHNIQUE: Multidetector CT imaging of the chest, abdomen and pelvis was performed following the standard protocol without IV contrast. The examination was performed without intravenous contrast due to history of renal insufficiency. Assessment of the soft tissues, solid viscera, and vascular structures are severely limited. RADIATION DOSE REDUCTION: This exam was performed according to the departmental dose-optimization program which includes automated exposure control, adjustment of the mA and/or kV according to patient size and/or use of iterative reconstruction technique. COMPARISON:  12/25/2023, 12/03/2020 FINDINGS: CT CHEST FINDINGS Cardiovascular: Unenhanced imaging of the heart is unremarkable without pericardial effusion. Normal caliber of the thoracic aorta. Extensive atherosclerosis of the aorta and coronary vasculature. Assessment of the vascular lumen cannot be performed without intravenous contrast. Mediastinum/Nodes: Mildly enlarged mediastinal lymph nodes measuring up to 13 mm in the subcarinal region, likely reactive. Thyroid , trachea, and esophagus are unremarkable. Lungs/Pleura: Dense left lower lobe airspace disease consistent with pneumonia, unchanged since preceding x-ray. Less pronounced areas of patchy consolidation are seen within the right upper lobe and lingula, which could reflect additional areas of pneumonia. No effusion or  pneumothorax. Central airways are patent. Musculoskeletal: No acute or destructive bony abnormalities. Reconstructed images demonstrate no  additional findings. CT ABDOMEN PELVIS FINDINGS Hepatobiliary: Unremarkable unenhanced appearance of the liver. Prior cholecystectomy. No biliary duct dilation. Pancreas: Unremarkable unenhanced appearance. Spleen: Unremarkable unenhanced appearance. Adrenals/Urinary Tract: Mild bilateral renal cortical atrophy consistent with known history of chronic renal insufficiency. Extensive calcifications at the renal hila are felt to be vascular. No obstructive uropathy within either kidney. The adrenals and bladder are unremarkable. Stomach/Bowel: No bowel obstruction or ileus. Normal appendix right lower quadrant. Scattered sigmoid diverticulosis without diverticulitis. No bowel wall thickening or inflammatory change. Vascular/Lymphatic: Normal caliber of the abdominal aorta. Extensive atherosclerosis. Assessment of the vascular lumen cannot be performed without intravenous contrast. No pathologic adenopathy. Reproductive: Uterus and bilateral adnexa are unremarkable. Other: No free fluid or free intraperitoneal gas. No abdominal wall hernia. Musculoskeletal: No acute or destructive bony abnormalities. Reconstructed images demonstrate no additional findings. IMPRESSION: 1. Dense left lower lobe pneumonia. Patchy airspace disease within the right upper lobe and lingula likely reflect additional foci of infection. 2. Reactive mediastinal lymphadenopathy. 3. No evidence of thoracoabdominal aortic aneurysm. Assessment of the vascular lumen cannot be performed without IV contrast. 4. Scattered sigmoid diverticulosis without diverticulitis. 5. Aortic Atherosclerosis (ICD10-I70.0). Coronary artery atherosclerosis. Electronically Signed   By: Ozell Daring M.D.   On: 12/25/2023 17:32   DG Chest 2 View Result Date: 12/25/2023 CLINICAL DATA:  Chest pain EXAM: CHEST - 2 VIEW COMPARISON:  03/24/2023 FINDINGS: Normal-sized heart. Interval patchy density at the left lung base. The remainder of the lungs are clear with normal  vascularity. Tortuous and partially calcified thoracic aorta. Thoracic spine degenerative changes. Mild-to-moderate right glenohumeral degenerative changes. IMPRESSION: Left basilar pneumonia. Electronically Signed   By: Elspeth Bathe M.D.   On: 12/25/2023 12:29     .Critical Care  Performed by: Marilyn Rosebud LABOR, Marilyn Sexton Authorized by: Marilyn Rosebud LABOR, Marilyn Sexton   Critical care provider statement:    Critical care time (minutes):  35   Critical care was necessary to treat or prevent imminent or life-threatening deterioration of the following conditions:  Sepsis and respiratory failure   Critical care was time spent personally by me on the following activities:  Development of treatment plan with patient or surrogate, discussions with consultants, evaluation of patient's response to treatment, examination of patient, ordering and review of laboratory studies, ordering and review of radiographic studies, ordering and performing treatments and interventions, pulse oximetry, re-evaluation of patient's condition and review of old charts    Medications Ordered in the ED  azithromycin (ZITHROMAX) 500 mg in sodium chloride  0.9 % 250 mL IVPB (500 mg Intravenous New Bag/Given 12/25/23 1838)  oxyCODONE -acetaminophen  (PERCOCET/ROXICET) 5-325 MG per tablet 1 tablet (1 tablet Oral Given 12/25/23 1158)  methylPREDNISolone  sodium succinate (SOLU-MEDROL ) 125 mg/2 mL injection 125 mg (125 mg Intravenous Given 12/25/23 1644)  ipratropium-albuterol  (DUONEB) 0.5-2.5 (3) MG/3ML nebulizer solution 3 mL (3 mLs Nebulization Given 12/25/23 1643)  cefTRIAXone  (ROCEPHIN ) 1 g in sodium chloride  0.9 % 100 mL IVPB (0 g Intravenous Stopped 12/25/23 1816)  sodium chloride  0.9 % bolus 500 mL (0 mLs Intravenous Stopped 12/25/23 5827)   78 year old multiple medical comorbidities here for evaluation multiple symptoms.  Patient appears to be Sexton poor historian.  She comes in with abdominal pain, chest pain, shortness of breath, cough,  weakness.  Intermittent pain to her abdomen about Sexton month ago however has had about 3 weeks of cough initially nonproductive then turning into yellow/brown sputum, today she  stated she had Sexton few episodes of blood tinged hemoptysis.  She also notes that she has had pain and swelling in her lower legs.  She was seen by her PCP Sexton few weeks ago for similar.  She states she had some testing done however does not recall any results.  She states she has been on antibiotics however can also not recall what antibiotic or when she started them.  She states she feels overall weak, fatigue.  She denies any emesis, bloody stool.  She states she has some chronic loose stools, 1-2 episodes of day.  No dysuria hematuria.  She is unsure if she uses any inhalers at home for her COPD.  Labs and imaging personally viewed and interpreted:  CBC leukocytosis 17.9 Metabolic panel creatinine 1.83, GFR 28, increased from baseline Hepatic function panel T. bili 1.4 Lactic 2.0 D-dimer 1.02 Troponin 32--27 BNP 192 UA negative for infection Chest x-ray possible pneumonia, will give Rocephin  and azithromycin CT chest abdomen pelvis shows multifocal pneumonia Ultrasound bilaterally negative for DVT  Patient reassessed.  Breath sounds improved after DuoNeb, steroids however still has some rhonchi.  She has some global weakness, unable to really get up at her baseline.  I suspect her symptoms likely due to multifocal pneumonia, meets sepsis criteria.  She also has an AKI.  Elevated troponin, downtrending.  Will admit to medicine.  CONSULT with Dr. Lou with medicine who is agreeable to evaluate patient for admission   Clinical Course as of 12/25/23 2350  Sat Dec 25, 2023  1947 Dr. Lou with medicine for admission [BH]    Clinical Course User Index [BH] Marilyn Masaki Sexton, Marilyn Sexton                                 Medical Decision Making Amount and/or Complexity of Data Reviewed Independent Historian:  spouse External Data Reviewed: labs, radiology, ECG and notes. Labs: ordered. Decision-making details documented in ED Course. Radiology: ordered and independent interpretation performed. Decision-making details documented in ED Course. ECG/medicine tests: ordered and independent interpretation performed. Decision-making details documented in ED Course.  Risk OTC drugs. Prescription drug management. Decision regarding hospitalization. Diagnosis or treatment significantly limited by social determinants of health.       Final diagnoses:  Multifocal pneumonia  Weakness  COPD exacerbation (HCC)  Sepsis without acute organ dysfunction, due to unspecified organism Marilyn Sexton)  Elevated d-dimer    ED Discharge Orders     None          Marilyn Knepp Sexton, Marilyn Sexton 12/25/23 2351    [place], [name], DO 12/26/23 1505

## 2023-12-25 NOTE — ED Provider Triage Note (Signed)
 Emergency Medicine Provider Triage Evaluation Note  Marilyn Sexton , a 78 y.o. female  was evaluated in triage.  Pt complains of left sides flank / chest pain intermittently for months, worse last night.  Also endorses cough for around a month.  Denies any nausea, vomiting, diarrhea, fever, chills.  Denies any dysuria.  No previous history of kidney stones, blood clots, versus other.  Review of Systems  Positive: Left sided flank / chest pain, cough Negative:   Physical Exam  BP (!) 113/48 (BP Location: Left Arm)   Pulse (!) 105   Temp 98.3 F (36.8 C)   Resp (!) 23   Ht 5' 5 (1.651 m)   Wt 91.2 kg   SpO2 93%   BMI 33.45 kg/m  Gen:   Awake, no distress   Resp:  Normal effort  MSK:   Moves extremities without difficulty  Other:  Tachycardia. Focal ttp of left ribcage, no CVA ttp. No anterior abdominal ttp.  Medical Decision Making  Medically screening exam initiated at 11:54 AM.  Appropriate orders placed.  DANASIA BAKER was informed that the remainder of the evaluation will be completed by another provider, this initial triage assessment does not replace that evaluation, and the importance of remaining in the ED until their evaluation is complete.  Workup initiated in triage    Rosan Sherlean DEL, PA-C 12/25/23 1154

## 2023-12-25 NOTE — ED Triage Notes (Signed)
 Abdominal pain on the left side that started a month ago got worse last night. Also has been coughing for the last month.

## 2023-12-25 NOTE — Progress Notes (Signed)
 VASCULAR LAB    Bilateral lower extremity venous duplex has been performed.  See CV proc for preliminary results.   Haylen Shelnutt, RVT 12/25/2023, 6:24 PM

## 2023-12-25 NOTE — ED Notes (Signed)
 [facility] called for 20 path panel, sent requisition form down per request from lab

## 2023-12-25 NOTE — H&P (Signed)
 History and Physical  Marilyn Sexton FMW:[id number] DOB: September 18, 1945 DOA: 12/25/2023  PCP: Duwaine Burnard Amble, NP   Chief Complaint: Cough, left-sided pain  HPI: Marilyn Sexton is a 78 y.o. female with medical history significant for T2DM, HTN, MI/CAD, CKD 3B, bladder spasms, bipolar disorder, insomnia, COPD and arthritis who presented to the ED for evaluation of cough and left-sided abdominal pain. Patient reports that about a month ago, she started feeling poorly with persistent cough. Over the last 2 weeks, the cough has become more productive, initially clear but now with brownish sputum.  She endorses associated left chest and abdominal pain with her persistent cough and mild shortness of breath but denies any fevers, chills, nausea, vomiting, headache or dizziness. Patient also reports she has chronic watery stools but denies any hematochezia or melena.  Patient reports she quit smoking cigarettes more than 25 years ago.  She does have a diagnosis of COPD.  ED Course: Initial vitals show temp 98.3, RR 23, HR 105, BP 113/48 and SBP 98% on room air. Initial labs significant for BUN/creatinine 28/1.83, glucose 252 WBC 17.9, Hgb 10.4, troponin 32-27, BNP 192, D-dimer 1.02, lactic acid 2.0-0.8, negative flu, RSV and COVID test, UA with significant glucosuria but no signs of infection. CXR shows left bibasilar pneumonia.  CT chest/abdomen/pelvis shows dense left lower lobe pneumonia and patchy airspace disease within the right upper lobe and lingula.  Pt received IV NS 500 cc bolus, IV Solu-Medrol , DuoNeb, IV Rocephin  and IV azithromycin. TRH was consulted for admission.   Review of Systems: Please see HPI for pertinent positives and negatives. A complete 10 system review of systems are otherwise negative.  Past Medical History:  Diagnosis Date   Arthritis    Diabetes mellitus without complication (HCC)    Dyspnea    Hypertension    Myocardial infarction [facility])    Thyroid  disease    Past  Surgical History:  Procedure Laterality Date   ABDOMINAL AORTOGRAM W/LOWER EXTREMITY N/A 12/19/2020   Procedure: ABDOMINAL AORTOGRAM W/LOWER EXTREMITY;  Surgeon: Gretta Lonni PARAS, MD;  Location: [facility] INVASIVE CV LAB;  Service: Cardiovascular;  Laterality: N/A;   CARDIAC CATHETERIZATION     CHOLECYSTECTOMY     coronary stents     EYE SURGERY     bilateral cataracts   KNEE ARTHROSCOPY Right 12/16/2016   Procedure: RIGHT KNEE ARTHROSCOPY AND DEBRIDEMENT;  Surgeon: Harden Jerona GAILS, MD;  Location: [facility] OR;  Service: Orthopedics;  Laterality: Right;   TOTAL KNEE ARTHROPLASTY Right 12/31/2017   Procedure: RIGHT TOTAL KNEE ARTHROPLASTY;  Surgeon: Yvone Rush, MD;  Location: [facility] ORS;  Service: Orthopedics;  Laterality: Right;  Adductor Block   TUBAL LIGATION     Social History:  reports that she quit smoking about 19 years ago. Her smoking use included cigarettes. She started smoking about 63 years ago. She has a 44 pack-year smoking history. She has never used smokeless tobacco. She reports that she does not drink alcohol and does not use drugs.  No Known Allergies  History reviewed. No pertinent family history.   Prior to Admission medications   Medication Sig Start Date End Date Taking? Authorizing Provider  albuterol  (PROVENTIL ) (2.5 MG/3ML) 0.083% nebulizer solution Take 2.5 mg by nebulization as needed. 04/08/23   [provider]  amLODipine  (NORVASC ) 5 MG tablet Take 5 mg by mouth daily.    [provider]  aspirin  EC 81 MG tablet Take 81 mg by mouth daily. Swallow whole.    [provider]  benazepril  (LOTENSIN ) 20 MG tablet Take 20 mg by mouth 2 (two) times daily.    [provider]  Doxepin  HCl 3 MG TABS Take 3 mg by mouth at bedtime. 09/13/20   [provider]  insulin  lispro protamine-lispro (HUMALOG MIX 75/25) (75-25) 100 UNIT/ML SUSP injection Inject 18 Units into the skin in the morning and at bedtime.    [provider]   Ipratropium-Albuterol  (COMBIVENT) 20-100 MCG/ACT AERS respimat Inhale 1 puff into the lungs every 6 (six) hours. Patient taking differently: Inhale 1 puff into the lungs every 6 (six) hours as needed for wheezing or shortness of breath. 11/05/20   Regalado, Belkys A, MD  LORazepam  (ATIVAN ) 0.5 MG tablet Take 0.5 mg by mouth every 8 (eight) hours as needed for anxiety.    [provider]  metoprolol  tartrate (LOPRESSOR ) 50 MG tablet Take 50 mg by mouth 2 (two) times daily.    [provider]  mirabegron  ER (MYRBETRIQ ) 50 MG TB24 tablet Take 50 mg by mouth daily.     [provider]  nitroGLYCERIN  (NITROSTAT ) 0.4 MG SL tablet Place 1 tablet (0.4 mg total) under the tongue every 5 (five) minutes x 3 doses as needed for chest pain. 08/16/17   Levern Hutching, MD  Propylene Glycol 0.6 % SOLN Place 2 drops into both eyes daily as needed (for dry eyes).     [provider]  risperiDONE  (RISPERDAL ) 1 MG tablet Take 1 mg by mouth at bedtime.    [provider]  rosuvastatin  (CRESTOR ) 10 MG tablet Take 1 tablet (10 mg total) by mouth daily. 07/21/23 10/19/23  [place], Peter M, MD  traMADol  (ULTRAM ) 50 MG tablet Take 1 tablet (50 mg total) by mouth 2 (two) times daily as needed for severe pain. 05/17/21   Keith Sor, PA-C  vitamin B-12 1000 MCG tablet Take 1 tablet (1,000 mcg total) by mouth daily. 11/06/20   Regalado, Belkys A, MD  vitamin C (ASCORBIC ACID) 500 MG tablet Take 500 mg by mouth daily.    [provider]    Physical Exam: BP (!) 151/66   Pulse 97   Temp 98 F (36.7 C) (Oral)   Resp (!) 23   Ht 5' 5 (1.651 m)   Wt 91.2 kg   SpO2 95%   BMI 33.45 kg/m  General: Pleasant, acutely ill and weak-appearing elderly woman laying in bed. No acute distress. HEENT: [place]/AT. Anicteric sclera. Dry mucous membrane. CV: Tachycardic.  Regular rhythm. No murmurs, rubs, or gallops. Pulmonary: Mild tachypnea. Lungs CTAB. Moderate expiratory wheezes in the  upper lung fields. Mild rhonchi in the left lung base.  Abdominal: Soft, nontender, nondistended. Normal bowel sounds. Extremities: Palpable radial and DP pulses. Normal ROM. Trace chronic ankle edema. Skin: Warm and dry. No obvious rash or lesions. Neuro: A&Ox3. Generalized weakness. Moves all extremities. Normal sensation to light touch. No focal deficit. Psych: Normal mood and affect          Labs on Admission:  Basic Metabolic Panel: Recent Labs  Lab 12/25/23 1148  NA 136  K 3.6  CL 100  CO2 21*  GLUCOSE 252*  BUN 28*  CREATININE 1.83*  CALCIUM  8.6*   Liver Function Tests: Recent Labs  Lab 12/25/23 1153  AST 15  ALT 10  ALKPHOS 66  BILITOT 1.4*  PROT 6.1*  ALBUMIN 3.0*   No results for input(s): LIPASE, AMYLASE in the last 168 hours. No results for input(s): AMMONIA in the last 168 hours.  CBC: Recent Labs  Lab 12/25/23 1148  WBC 17.9*  HGB 10.4*  HCT 31.1*  MCV 108.0*  PLT 261   Cardiac Enzymes: No results for input(s): CKTOTAL, CKMB, CKMBINDEX, TROPONINI in the last 168 hours. BNP (last 3 results) Recent Labs    12/25/23 1548  BNP 192.3*    ProBNP (last 3 results) No results for input(s): PROBNP in the last 8760 hours.  CBG: No results for input(s): GLUCAP in the last 168 hours.  Radiological Exams on Admission: VAS US  LOWER EXTREMITY VENOUS (DVT) (ONLY [facility]) Result Date: 12/25/2023  Lower Venous DVT Study Patient Name:  Marilyn Sexton  Date of Exam:   12/25/2023 Medical Rec #: [id number]           Accession #:    [id number] Date of Birth: August 24, 1945           Patient Gender: F Patient Age:   68 years Exam Location:  [facility] Procedure:      VAS US  LOWER EXTREMITY VENOUS (DVT) Referring Phys: BRITNI HENDERLY --------------------------------------------------------------------------------  Indications: Pain, and SOB. Left sided abdominal pain X 1 month, worsening overnight.  Limitations: Body habitus and poor  ultrasound/tissue interface. Comparison Study: Prior negative left LEV done 10/10/20. Prior negative right LEV                   done 02/20/17 Performing Technologist: Alberta Lis RVS  Examination Guidelines: A complete evaluation includes B-mode imaging, spectral Doppler, color Doppler, and power Doppler as needed of all accessible portions of each vessel. Bilateral testing is considered an integral part of a complete examination. Limited examinations for reoccurring indications may be performed as noted. The reflux portion of the exam is performed with the patient in reverse Trendelenburg.  +---------+---------------+---------+-----------+----------+--------------+ RIGHT    CompressibilityPhasicitySpontaneityPropertiesThrombus Aging +---------+---------------+---------+-----------+----------+--------------+ CFV      Full           Yes      Yes                                 +---------+---------------+---------+-----------+----------+--------------+ SFJ      Full                                                        +---------+---------------+---------+-----------+----------+--------------+ FV Prox  Full           Yes      Yes                                 +---------+---------------+---------+-----------+----------+--------------+ FV Mid   Full           Yes      Yes                                 +---------+---------------+---------+-----------+----------+--------------+ FV DistalFull           Yes      Yes                                 +---------+---------------+---------+-----------+----------+--------------+ PFV  Full           Yes      Yes                                 +---------+---------------+---------+-----------+----------+--------------+ POP      Full           Yes      Yes                                 +---------+---------------+---------+-----------+----------+--------------+ PTV      Full                                                         +---------+---------------+---------+-----------+----------+--------------+ PERO     Full                                                        +---------+---------------+---------+-----------+----------+--------------+   +---------+---------------+---------+-----------+----------+-------------------+ LEFT     CompressibilityPhasicitySpontaneityPropertiesThrombus Aging      +---------+---------------+---------+-----------+----------+-------------------+ CFV      Full           Yes      Yes                                      +---------+---------------+---------+-----------+----------+-------------------+ SFJ      Full                                                             +---------+---------------+---------+-----------+----------+-------------------+ FV Prox  Full                                                             +---------+---------------+---------+-----------+----------+-------------------+ FV Mid   Full                                                             +---------+---------------+---------+-----------+----------+-------------------+ FV DistalFull           Yes      Yes                                      +---------+---------------+---------+-----------+----------+-------------------+ PFV      Full                                                             +---------+---------------+---------+-----------+----------+-------------------+  POP      Full           Yes      Yes                                      +---------+---------------+---------+-----------+----------+-------------------+ PTV      Full                                                             +---------+---------------+---------+-----------+----------+-------------------+ PERO                                                  Not well visualized +---------+---------------+---------+-----------+----------+-------------------+     Summary: BILATERAL: - No evidence of deep vein thrombosis seen in the lower extremities, bilaterally. -No evidence of popliteal cyst, bilaterally.   *See table(s) above for measurements and observations.    Preliminary    CT CHEST ABDOMEN PELVIS WO CONTRAST Result Date: 12/25/2023 CLINICAL DATA:  Flank pain, chest pain, left basilar pneumonia on x-ray EXAM: CT CHEST, ABDOMEN AND PELVIS WITHOUT CONTRAST TECHNIQUE: Multidetector CT imaging of the chest, abdomen and pelvis was performed following the standard protocol without IV contrast. The examination was performed without intravenous contrast due to history of renal insufficiency. Assessment of the soft tissues, solid viscera, and vascular structures are severely limited. RADIATION DOSE REDUCTION: This exam was performed according to the departmental dose-optimization program which includes automated exposure control, adjustment of the mA and/or kV according to patient size and/or use of iterative reconstruction technique. COMPARISON:  12/25/2023, 12/03/2020 FINDINGS: CT CHEST FINDINGS Cardiovascular: Unenhanced imaging of the heart is unremarkable without pericardial effusion. Normal caliber of the thoracic aorta. Extensive atherosclerosis of the aorta and coronary vasculature. Assessment of the vascular lumen cannot be performed without intravenous contrast. Mediastinum/Nodes: Mildly enlarged mediastinal lymph nodes measuring up to 13 mm in the subcarinal region, likely reactive. Thyroid , trachea, and esophagus are unremarkable. Lungs/Pleura: Dense left lower lobe airspace disease consistent with pneumonia, unchanged since preceding x-ray. Less pronounced areas of patchy consolidation are seen within the right upper lobe and lingula, which could reflect additional areas of pneumonia. No effusion or pneumothorax. Central airways are patent. Musculoskeletal: No acute or destructive bony abnormalities. Reconstructed images demonstrate no additional findings.  CT ABDOMEN PELVIS FINDINGS Hepatobiliary: Unremarkable unenhanced appearance of the liver. Prior cholecystectomy. No biliary duct dilation. Pancreas: Unremarkable unenhanced appearance. Spleen: Unremarkable unenhanced appearance. Adrenals/Urinary Tract: Mild bilateral renal cortical atrophy consistent with known history of chronic renal insufficiency. Extensive calcifications at the renal hila are felt to be vascular. No obstructive uropathy within either kidney. The adrenals and bladder are unremarkable. Stomach/Bowel: No bowel obstruction or ileus. Normal appendix right lower quadrant. Scattered sigmoid diverticulosis without diverticulitis. No bowel wall thickening or inflammatory change. Vascular/Lymphatic: Normal caliber of the abdominal aorta. Extensive atherosclerosis. Assessment of the vascular lumen cannot be performed without intravenous contrast. No pathologic adenopathy. Reproductive: Uterus and bilateral adnexa are unremarkable. Other: No free fluid or free intraperitoneal gas. No abdominal wall hernia. Musculoskeletal: No acute or destructive bony abnormalities. Reconstructed images demonstrate no additional findings. IMPRESSION:  1. Dense left lower lobe pneumonia. Patchy airspace disease within the right upper lobe and lingula likely reflect additional foci of infection. 2. Reactive mediastinal lymphadenopathy. 3. No evidence of thoracoabdominal aortic aneurysm. Assessment of the vascular lumen cannot be performed without IV contrast. 4. Scattered sigmoid diverticulosis without diverticulitis. 5. Aortic Atherosclerosis (ICD10-I70.0). Coronary artery atherosclerosis. Electronically Signed   By: Ozell Daring M.D.   On: 12/25/2023 17:32   DG Chest 2 View Result Date: 12/25/2023 CLINICAL DATA:  Chest pain EXAM: CHEST - 2 VIEW COMPARISON:  03/24/2023 FINDINGS: Normal-sized heart. Interval patchy density at the left lung base. The remainder of the lungs are clear with normal vascularity. Tortuous and  partially calcified thoracic aorta. Thoracic spine degenerative changes. Mild-to-moderate right glenohumeral degenerative changes. IMPRESSION: Left basilar pneumonia. Electronically Signed   By: Elspeth Bathe M.D.   On: 12/25/2023 12:29   My independent interpretation of EKG: Sinus rhythm with nonspecific T wave abnormalities and occasional PVCs  Assessment/Plan MAKINSEY PEPITONE is a 78 y.o. female with medical history significant for T2DM, HTN, MI/CAD, CKD 3B, bladder spasms, bipolar disorder, insomnia, COPD and arthritis who presented to the ED for evaluation of cough and left-sided abdominal pain and admitted for sepsis secondary to pneumonia  # Sepsis # Community-acquired pneumonia - Pt presented with worsening cough and mild shortness of breath - Chest imaging shows multifocal pneumonia - Met sepsis criteria with tachycardia, tachypnea, leukocytosis, lactic acidosis and evidence of respiratory infection - Continue IV Rocephin  and azithromycin - Start IV NS 150 cc/h - Mucinex DM twice daily - Negative respiratory panel, follow-up MRSA screen, procalcitonin, urinary Legionella and strep pneumo - Follow-up blood culture and sputum culture - Trend CBC, fever curve - Incentive spirometer, flutter valve - Supplemental O2 as needed  # COPD exacerbation - Presented with worsening cough and shortness of breath - Pt with expiratory wheezing on lung auscultation - S/p IV solumedrol, DuoNebs in the ED - Start prednisone  40 mg daily - Continue azithromycin as above - Start daily ICS-LABA therapy - Schedule DuoNebs due to persistent wheezing  # Type 2 diabetes with hyperglycemia - Glucose elevated to 252 on admission - Per spouse, patient is on Humalog mix 75/25 15 units twice daily - Start Semglee  10 units twice daily and titrate as needed - SSI with meals, CBG monitoring - Follow-up repeat A1c  # AKI on CKD 3B - Creatinine elevated to 1.83, from baseline of 1.2-1.5 - Likely secondary  to dehydration and acute infection - Continue IV NS at 150 cc/hr - Trend renal function - Avoid nephrotoxic meds  # HTN - BP stable with SBP in the  110-140s - Continue amlodipine  and Lopressor  - Hold benazepril  in the setting of AKI  # Bipolar disorder - Continue risperidone  - Resume other meds after completion of full med rec  # HLD - Continue rosuvastatin   # Bladder spasms - Continue Myrbetriq   # Generalized weakness - In the setting of acute illness - PT/OT eval and treat  DVT prophylaxis: Heparin     Code Status: Limited: Do not attempt resuscitation (DNR) -DNR-LIMITED -Do Not Intubate/DNI   Consults called: None  Family Communication: Discussed admission with spouse at bedside  Severity of Illness: The appropriate patient status for this patient is INPATIENT. Inpatient status is judged to be reasonable and necessary in order to provide the required intensity of service to ensure the patient's safety. The patient's presenting symptoms, physical exam findings, and initial radiographic and laboratory data in the context of their  chronic comorbidities is felt to place them at high risk for further clinical deterioration. Furthermore, it is not anticipated that the patient will be medically stable for discharge from the hospital within 2 midnights of admission.   * I certify that at the point of admission it is my clinical judgment that the patient will require inpatient hospital care spanning beyond 2 midnights from the point of admission due to high intensity of service, high risk for further deterioration and high frequency of surveillance required.*  Level of care: Progressive   I personally spent a total of 75 minutes in the care of the patient today including preparing to see the patient, getting/reviewing separately obtained history, performing a medically appropriate exam/evaluation, placing orders, documenting clinical information in the EHR, and independently  interpreting results.   Lou Claretta HERO, MD 12/25/2023, 8:16 PM [facility] Pager: [phone] Isaiah 41:10   If 7PM-7AM, please contact night-coverage [email or web address] Password [email or web address]

## 2023-12-26 DIAGNOSIS — A419 Sepsis, unspecified organism: Secondary | ICD-10-CM | POA: Diagnosis not present

## 2023-12-26 DIAGNOSIS — J189 Pneumonia, unspecified organism: Secondary | ICD-10-CM | POA: Diagnosis not present

## 2023-12-26 DIAGNOSIS — I10 Essential (primary) hypertension: Secondary | ICD-10-CM

## 2023-12-26 DIAGNOSIS — R531 Weakness: Secondary | ICD-10-CM

## 2023-12-26 DIAGNOSIS — J188 Other pneumonia, unspecified organism: Principal | ICD-10-CM

## 2023-12-26 DIAGNOSIS — F319 Bipolar disorder, unspecified: Secondary | ICD-10-CM

## 2023-12-26 LAB — CBC
HCT: 29.8 % — ABNORMAL LOW (ref 36.0–46.0)
Hemoglobin: 10.1 g/dL — ABNORMAL LOW (ref 12.0–15.0)
MCH: 36.5 pg — ABNORMAL HIGH (ref 26.0–34.0)
MCHC: 33.9 g/dL (ref 30.0–36.0)
MCV: 107.6 fL — ABNORMAL HIGH (ref 80.0–100.0)
Platelets: 229 K/uL (ref 150–400)
RBC: 2.77 MIL/uL — ABNORMAL LOW (ref 3.87–5.11)
RDW: 16.5 % — ABNORMAL HIGH (ref 11.5–15.5)
WBC: 15.8 K/uL — ABNORMAL HIGH (ref 4.0–10.5)
nRBC: 0.1 % (ref 0.0–0.2)

## 2023-12-26 LAB — BASIC METABOLIC PANEL WITH GFR
Anion gap: 13 (ref 5–15)
BUN: 23 mg/dL (ref 8–23)
CO2: 21 mmol/L — ABNORMAL LOW (ref 22–32)
Calcium: 8.5 mg/dL — ABNORMAL LOW (ref 8.9–10.3)
Chloride: 102 mmol/L (ref 98–111)
Creatinine, Ser: 1.24 mg/dL — ABNORMAL HIGH (ref 0.44–1.00)
GFR, Estimated: 45 mL/min — ABNORMAL LOW (ref >60)
Glucose, Bld: 189 mg/dL — ABNORMAL HIGH (ref 70–99)
Potassium: 3.8 mmol/L (ref 3.5–5.1)
Sodium: 136 mmol/L (ref 135–145)

## 2023-12-26 LAB — HEMOGLOBIN A1C
Hgb A1c MFr Bld: 5 % (ref 4.8–5.6)
Mean Plasma Glucose: 96.8 mg/dL

## 2023-12-26 LAB — GLUCOSE, CAPILLARY
Glucose-Capillary: 138 mg/dL — ABNORMAL HIGH (ref 70–99)
Glucose-Capillary: 197 mg/dL — ABNORMAL HIGH (ref 70–99)

## 2023-12-26 LAB — CBG MONITORING, ED: Glucose-Capillary: 187 mg/dL — ABNORMAL HIGH (ref 70–99)

## 2023-12-26 LAB — MRSA NEXT GEN BY PCR, NASAL: MRSA by PCR Next Gen: NOT DETECTED

## 2023-12-26 LAB — PROCALCITONIN: Procalcitonin: 0.1 ng/mL

## 2023-12-26 MED ORDER — RISPERIDONE 3 MG PO TABS
3.0000 mg | ORAL_TABLET | Freq: Every day | ORAL | Status: DC
  Administered 2023-12-26 – 2023-12-27 (×2): 3 mg via ORAL
  Filled 2023-12-26 (×5): qty 1

## 2023-12-26 MED ORDER — METOPROLOL TARTRATE 50 MG PO TABS
50.0000 mg | ORAL_TABLET | Freq: Two times a day (BID) | ORAL | Status: DC
  Administered 2023-12-26 – 2023-12-28 (×5): 50 mg via ORAL
  Filled 2023-12-26 (×3): qty 1
  Filled 2023-12-26: qty 2
  Filled 2023-12-26: qty 1

## 2023-12-26 MED ORDER — MIRABEGRON ER 50 MG PO TB24
50.0000 mg | ORAL_TABLET | Freq: Every day | ORAL | Status: DC
  Administered 2023-12-26 – 2023-12-28 (×3): 50 mg via ORAL
  Filled 2023-12-26 (×4): qty 1

## 2023-12-26 MED ORDER — AMLODIPINE BESYLATE 5 MG PO TABS
5.0000 mg | ORAL_TABLET | Freq: Every day | ORAL | Status: DC
  Administered 2023-12-26 – 2023-12-27 (×2): 5 mg via ORAL
  Filled 2023-12-26 (×2): qty 1

## 2023-12-26 MED ORDER — ROSUVASTATIN CALCIUM 5 MG PO TABS
10.0000 mg | ORAL_TABLET | Freq: Every day | ORAL | Status: DC
  Administered 2023-12-26 – 2023-12-28 (×3): 10 mg via ORAL
  Filled 2023-12-26 (×3): qty 2

## 2023-12-26 MED ORDER — PREDNISONE 20 MG PO TABS
40.0000 mg | ORAL_TABLET | Freq: Every day | ORAL | Status: DC
  Administered 2023-12-26 – 2023-12-28 (×3): 40 mg via ORAL
  Filled 2023-12-26 (×3): qty 2

## 2023-12-26 MED ORDER — INSULIN GLARGINE-YFGN 100 UNIT/ML ~~LOC~~ SOLN
10.0000 [IU] | Freq: Two times a day (BID) | SUBCUTANEOUS | Status: DC
  Administered 2023-12-26 – 2023-12-28 (×6): 10 [IU] via SUBCUTANEOUS
  Filled 2023-12-26 (×8): qty 0.1

## 2023-12-26 MED ORDER — FLUTICASONE FUROATE-VILANTEROL 200-25 MCG/ACT IN AEPB
1.0000 | INHALATION_SPRAY | Freq: Every day | RESPIRATORY_TRACT | Status: DC
  Administered 2023-12-26 – 2023-12-28 (×3): 1 via RESPIRATORY_TRACT
  Filled 2023-12-26 (×2): qty 28

## 2023-12-26 MED ORDER — IPRATROPIUM-ALBUTEROL 0.5-2.5 (3) MG/3ML IN SOLN
3.0000 mL | RESPIRATORY_TRACT | Status: AC
  Filled 2023-12-26: qty 3

## 2023-12-26 MED ORDER — AZITHROMYCIN 250 MG PO TABS
500.0000 mg | ORAL_TABLET | Freq: Every day | ORAL | Status: DC
  Administered 2023-12-26 – 2023-12-28 (×3): 500 mg via ORAL
  Filled 2023-12-26 (×3): qty 2

## 2023-12-26 MED ORDER — SODIUM CHLORIDE 0.9 % IV SOLN
1.0000 g | INTRAVENOUS | Status: DC
  Administered 2023-12-26 – 2023-12-28 (×3): 1 g via INTRAVENOUS
  Filled 2023-12-26 (×4): qty 10

## 2023-12-26 NOTE — ED Notes (Signed)
 Pt appears to be increasingly SOB with increased expiratory wheezing. Dr.Hall notified about attempts made to make pt more comfortable. Pt is 94% on room air

## 2023-12-26 NOTE — ED Notes (Signed)
 Attempted to place pt on 2L [place] d/t notable wheezing and SOB. Pt o2 97%/ on Room air. Stated that the o2 did give her relief, but then stated she did not want to wear the oxygen. O2 removed per pt. Pt continues to have continuous productive coughing

## 2023-12-26 NOTE — Plan of Care (Signed)

## 2023-12-26 NOTE — Evaluation (Signed)
 Physical Therapy Evaluation Patient Details Name: Marilyn Sexton MRN: [id number] DOB: Nov 01, 1945 Today's Date: 12/26/2023  History of Present Illness  78 y.o. female presents to [facility] 12/25/23 with cough and L sided abdominal pain. Admitted with sepsis 2/2 PNA and COPD exacerbation. PMHx: T2DM, HTN, MI/CAD, CKD 3B, bladder spasms, bipolar disorder, insomnia, COPD and arthritis   Clinical Impression  PTA pt was independent for mobility with no AD. In the days leading up to admit, pt was ambulating with use of RW. Pt presents with generalized weakness, decreased activity tolerance, and impaired cardiopulmonary system. Pt was able to stand and ambulate 261ft with supervision and use of RW. Distance limited by feeling SOB towards end of gait, vitals below. Pt will have 24/7 assist available upon d/c home. Pt declined need for post-acute PT with PT to follow acutely to work on functional impairments listed below.   92-94% SpO2 on RA at rest 90-92% SpO2 on RA during gait        If plan is discharge home, recommend the following: Assist for transportation;Help with stairs or ramp for entrance;A little help with walking and/or transfers   Can travel by private vehicle    Yes    Equipment Recommendations None recommended by PT     Functional Status Assessment Patient has had a recent decline in their functional status and demonstrates the ability to make significant improvements in function in a reasonable and predictable amount of time.     Precautions / Restrictions Precautions Precautions: Fall Recall of Precautions/Restrictions: Intact Precaution/Restrictions Comments: watch O2 Restrictions Weight Bearing Restrictions Per Provider Order: No      Mobility  Bed Mobility Overal bed mobility: Needs Assistance Bed Mobility: Supine to Sit, Sit to Supine    Supine to sit: Mod assist Sit to supine: Min assist   General bed mobility comments: ModA to raise trunk due to narrow  stretcher, MinA for return to supine for slightl LE management. Able to bridge to scoot towards HOB in supine    Transfers Overall transfer level: Needs assistance Equipment used: Rolling walker (2 wheels) Transfers: Sit to/from Stand Sit to Stand: Supervision  General transfer comment: supervision for safety with cues for hand placement    Ambulation/Gait Ambulation/Gait assistance: Supervision Gait Distance (Feet): 200 Feet Assistive device: Rolling walker (2 wheels) Gait Pattern/deviations: Step-through pattern, Decreased stance time - left Gait velocity: decr     General Gait Details: slow and steady gait, cues for RW proximity    Balance Overall balance assessment: Needs assistance Sitting-balance support: No upper extremity supported, Feet supported Sitting balance-Leahy Scale: Good     Standing balance support: Bilateral upper extremity supported, During functional activity, Reliant on assistive device for balance Standing balance-Leahy Scale: Poor Standing balance comment: reliant on UE support       Pertinent Vitals/Pain Pain Assessment Pain Assessment: No/denies pain    Home Living Family/patient expects to be discharged to:: Private residence Living Arrangements: Spouse/significant other Available Help at Discharge: Family;Available 24 hours/day Type of Home: House Home Access: Stairs to enter;Ramped entrance       Home Layout: One level Home Equipment: Grab bars - tub/shower;Shower seat;Wheelchair - Forensic psychologist (2 wheels);BSC/3in1      Prior Function Prior Level of Function : Independent/Modified Independent;Driving  Mobility Comments: Ind with no AD, using RW prior to admit ADLs Comments: Ind     Extremity/Trunk Assessment   Upper Extremity Assessment Upper Extremity Assessment: Defer to OT evaluation    Lower Extremity Assessment Lower Extremity  Assessment: Generalized weakness    Cervical / Trunk Assessment Cervical / Trunk  Assessment: Normal  Communication   Communication Communication: No apparent difficulties    Cognition Arousal: Alert Behavior During Therapy: WFL for tasks assessed/performed   PT - Cognitive impairments: No apparent impairments  Following commands: Intact       Cueing Cueing Techniques: Verbal cues     General Comments General comments (skin integrity, edema, etc.): Husband present and supportive during session     PT Assessment Patient needs continued PT services  PT Problem List Decreased activity tolerance;Decreased strength;Decreased mobility;Decreased knowledge of use of DME;Cardiopulmonary status limiting activity       PT Treatment Interventions Gait training;DME instruction;Functional mobility training;Therapeutic activities;Therapeutic exercise;Balance training;Neuromuscular re-education;Patient/family education    PT Goals (Current goals can be found in the Care Plan section)  Acute Rehab PT Goals Patient Stated Goal: to feel better PT Goal Formulation: With patient/family Time For Goal Achievement: 01/09/24 Potential to Achieve Goals: Good    Frequency Min 1X/week        AM-PAC PT 6 Clicks Mobility  Outcome Measure Help needed turning from your back to your side while in a flat bed without using bedrails?: A Lot Help needed moving from lying on your back to sitting on the side of a flat bed without using bedrails?: A Lot Help needed moving to and from a bed to a chair (including a wheelchair)?: A Little Help needed standing up from a chair using your arms (e.g., wheelchair or bedside chair)?: A Little Help needed to walk in hospital room?: A Little Help needed climbing 3-5 steps with a railing? : A Little 6 Click Score: 16    End of Session Equipment Utilized During Treatment: Gait belt Activity Tolerance: Patient tolerated treatment well Patient left: in bed;with call bell/phone within reach;with family/visitor present Nurse Communication:  Mobility status PT Visit Diagnosis: Other abnormalities of gait and mobility (R26.89)    Time: 9041-8978 PT Time Calculation (min) (ACUTE ONLY): 23 min   Charges:   PT Evaluation $PT Eval Low Complexity: 1 Low   PT General Charges $$ ACUTE PT VISIT: 1 Visit       Kate ORN, PT, DPT Secure Chat Preferred  Rehab Office [phone]   Kate BRAVO Wendolyn 12/26/2023, 10:29 AM

## 2023-12-26 NOTE — Progress Notes (Signed)
 PROGRESS NOTE    Marilyn Sexton  FMW:[id number] DOB: Aug 06, 1945 DOA: 12/25/2023 PCP: Duwaine Burnard Amble, NP  Outpatient Specialists:     Brief Narrative:  As per H&P done on admission: Marilyn Sexton is a 78 y.o. female with medical history significant for T2DM, HTN, MI/CAD, CKD 3B, bladder spasms, bipolar disorder, insomnia, COPD and arthritis who presented to the ED for evaluation of cough and left-sided abdominal pain. Patient reports that about a month ago, she started feeling poorly with persistent cough. Over the last 2 weeks, the cough has become more productive, initially clear but now with brownish sputum.  She endorses associated left chest and abdominal pain with her persistent cough and mild shortness of breath but denies any fevers, chills, nausea, vomiting, headache or dizziness. Patient also reports she has chronic watery stools but denies any hematochezia or melena.  Patient reports she quit smoking cigarettes more than 25 years ago.  She does have a diagnosis of COPD.   ED Course: Initial vitals show temp 98.3, RR 23, HR 105, BP 113/48 and SBP 98% on room air. Initial labs significant for BUN/creatinine 28/1.83, glucose 252 WBC 17.9, Hgb 10.4, troponin 32-27, BNP 192, D-dimer 1.02, lactic acid 2.0-0.8, negative flu, RSV and COVID test, UA with significant glucosuria but no signs of infection. CXR shows left bibasilar pneumonia.  CT chest/abdomen/pelvis shows dense left lower lobe pneumonia and patchy airspace disease within the right upper lobe and lingula.  Pt received IV NS 500 cc bolus, IV Solu-Medrol , DuoNeb, IV Rocephin  and IV azithromycin. TRH was consulted for admission.   12/26/2023: Patient seen.  Patient tells me that she started smoking cigarettes at the age of 66, and quit about 20 years ago.  Patient smoked 1 pack of cigarettes daily.  Will continue management for COPD exacerbation, sepsis/pneumonia.  BMP done today revealed sodium of 136, potassium of 3.8,  chloride 102, CO2 21, BUN of 23, serum creatinine of 1.24 and blood sugar of 189.  CBC revealed WBC of 15.8, hemoglobin of 10.1, hematocrit of 29.8 and platelet count of 229.   Assessment & Plan:   Principal Problem:   Sepsis due to pneumonia [facility]) Active Problems:   Acute renal failure superimposed on stage 3b chronic kidney disease (HCC)   Multifocal pneumonia   Generalized weakness   Essential hypertension   Bipolar disorder (HCC)   Sepsis: Community-acquired pneumonia: - Pt presented with worsening cough and mild shortness of breath - Chest x-ray revealed left basilar pneumonia.   - Met sepsis criteria with tachycardia, tachypnea, leukocytosis, lactic acidosis and evidence of respiratory infection - Continue IV Rocephin  and azithromycin - Negative respiratory panel, follow-up MRSA screen, procalcitonin, urinary Legionella and strep pneumo - Follow-up blood culture and sputum culture - WBC of 15.8 today (down from 17.9). - Incentive spirometer, flutter valve - Supplemental O2 as needed   COPD exacerbation - Presented with worsening cough, wheezing and shortness of breath - Continue prednisone , DuoNeb Q6 hourly and Breo Ellipta.  Continue azithromycin.  I -Continue prednisone  40 mg p.o. once daily.   Type 2 diabetes with hyperglycemia - Glucose elevated to 252 on admission -A1c of 5%. - Continue to monitor closely. - Per spouse, patient was on Humalog mix 75/25 15 units twice daily - Continue Semglee  10 units twice daily and titrate as needed   AKI on CKD 3B - Creatinine elevated to 1.83, from baseline of 1.2-1.5 - Likely secondary to dehydration and acute infection - Serum creatinine is improving.  Serum  creatinine of 1.24 today.- -Continue to monitor renal function and electrolytes - Avoid nephrotoxic meds   Hypertension: -Continue to monitor and optimize. -ACE inhibitor is on hold.   Bipolar disorder - Continue risperidone  - Stable.  Hyperlipidemia:  -  Continue rosuvastatin    Bladder spasms - Continue Myrbetriq    Generalized weakness - In the setting of acute illness - PT/OT eval and treat   DVT prophylaxis: Subcutaneous heparin  Code Status: DO NOT RESUSCITATE Family Communication:  Disposition Plan: Inpatient   Consultants:  None.  Procedures:  None.  Antimicrobials:  IV Rocephin . IV azithromycin   Subjective: No new complaints  Objective: Vitals:   12/26/23 0600 12/26/23 0718 12/26/23 0915 12/26/23 1110  BP: (!) 106/51  137/64 (!) 148/71  Pulse: (!) 101  (!) 101 95  Resp: 20  (!) 27 (!) 24  Temp:  98.1 F (36.7 C)  98.2 F (36.8 C)  TempSrc:  Oral  Oral  SpO2: 95%  100% 96%  Weight:      Height:        Intake/Output Summary (Last 24 hours) at 12/26/2023 1141 Last data filed at 12/25/2023 1837 Gross per 24 hour  Intake 550 ml  Output --  Net 550 ml   Filed Weights   12/25/23 1125  Weight: 91.2 kg    Examination:  General exam: Appears calm and comfortable.  Patient is obese. Respiratory system: Decreased air entry with expiratory wheeze. Cardiovascular system: S1 & S2 heard Gastrointestinal system: Abdomen is obese, soft and nontender.  Central nervous system: Awake and alert.   Extremities: Leg edema  Data Reviewed: I have personally reviewed following labs and imaging studies  CBC: Recent Labs  Lab 12/25/23 1148 12/26/23 0438  WBC 17.9* 15.8*  HGB 10.4* 10.1*  HCT 31.1* 29.8*  MCV 108.0* 107.6*  PLT 261 229   Basic Metabolic Panel: Recent Labs  Lab 12/25/23 1148 12/26/23 0438  NA 136 136  K 3.6 3.8  CL 100 102  CO2 21* 21*  GLUCOSE 252* 189*  BUN 28* 23  CREATININE 1.83* 1.24*  CALCIUM  8.6* 8.5*   GFR: Estimated Creatinine Clearance: 43.1 mL/min (A) (by C-G formula based on SCr of 1.24 mg/dL (H)). Liver Function Tests: Recent Labs  Lab 12/25/23 1153  AST 15  ALT 10  ALKPHOS 66  BILITOT 1.4*  PROT 6.1*  ALBUMIN 3.0*   No results for input(s): LIPASE,  AMYLASE in the last 168 hours. No results for input(s): AMMONIA in the last 168 hours. Coagulation Profile: No results for input(s): INR, PROTIME in the last 168 hours. Cardiac Enzymes: No results for input(s): CKTOTAL, CKMB, CKMBINDEX, TROPONINI in the last 168 hours. BNP (last 3 results) No results for input(s): PROBNP in the last 8760 hours. HbA1C: Recent Labs    12/26/23 0438  HGBA1C 5.0   CBG: Recent Labs  Lab 12/25/23 2150 12/26/23 1116  GLUCAP 211* 187*   Lipid Profile: No results for input(s): CHOL, HDL, LDLCALC, TRIG, CHOLHDL, LDLDIRECT in the last 72 hours. Thyroid  Function Tests: No results for input(s): TSH, T4TOTAL, FREET4, T3FREE, THYROIDAB in the last 72 hours. Anemia Panel: No results for input(s): VITAMINB12, FOLATE, FERRITIN, TIBC, IRON, RETICCTPCT in the last 72 hours. Urine analysis:    Component Value Date/Time   COLORURINE YELLOW 12/25/2023 1516   APPEARANCEUR HAZY (A) 12/25/2023 1516   LABSPEC 1.022 12/25/2023 1516   PHURINE 5.0 12/25/2023 1516   GLUCOSEU >=500 (A) 12/25/2023 1516   HGBUR NEGATIVE 12/25/2023 1516   BILIRUBINUR NEGATIVE  12/25/2023 1516   KETONESUR NEGATIVE 12/25/2023 1516   PROTEINUR NEGATIVE 12/25/2023 1516   UROBILINOGEN 0.2 07/20/2014 0732   NITRITE NEGATIVE 12/25/2023 1516   LEUKOCYTESUR NEGATIVE 12/25/2023 1516   Sepsis Labs: @LABRCNTIP (procalcitonin:4,lacticidven:4)  ) Recent Results (from the past 240 hours)  Blood culture (routine x 2)     Status: None (Preliminary result)   Collection Time: 12/25/23  3:50 PM   Specimen: BLOOD LEFT FOREARM  Result Value Ref Range Status   Specimen Description BLOOD LEFT FOREARM  Final   Special Requests   Final    BOTTLES DRAWN AEROBIC AND ANAEROBIC Blood Culture results may not be optimal due to an inadequate volume of blood received in culture bottles   Culture   Final    NO GROWTH < 24 HOURS Performed at [facility]  Lab, [address], [place], [place] [address]    Report Status PENDING  Incomplete  Blood culture (routine x 2)     Status: None (Preliminary result)   Collection Time: 12/25/23  4:37 PM   Specimen: BLOOD  Result Value Ref Range Status   Specimen Description BLOOD SITE NOT SPECIFIED  Final   Special Requests   Final    BOTTLES DRAWN AEROBIC AND ANAEROBIC Blood Culture results may not be optimal due to an inadequate volume of blood received in culture bottles   Culture   Final    NO GROWTH < 24 HOURS Performed at [facility] Lab, [address], [place], [place] [address]    Report Status PENDING  Incomplete  Resp panel by RT-PCR (RSV, Flu A&B, Covid) Anterior Nasal Swab     Status: None   Collection Time: 12/25/23  7:24 PM   Specimen: Anterior Nasal Swab  Result Value Ref Range Status   SARS Coronavirus 2 by RT PCR NEGATIVE NEGATIVE Final   Influenza A by PCR NEGATIVE NEGATIVE Final   Influenza B by PCR NEGATIVE NEGATIVE Final    Comment: (NOTE) The Xpert Xpress SARS-CoV-2/FLU/RSV plus assay is intended as an aid in the diagnosis of influenza from Nasopharyngeal swab specimens and should not be used as a sole basis for treatment. Nasal washings and aspirates are unacceptable for Xpert Xpress SARS-CoV-2/FLU/RSV testing.  Fact Sheet for Patients: BloggerCourse.com  Fact Sheet for Healthcare Providers: SeriousBroker.it  This test is not yet approved or cleared by the [place]  FDA and has been authorized for detection and/or diagnosis of SARS-CoV-2 by FDA under an Emergency Use Authorization (EUA). This EUA will remain in effect (meaning this test can be used) for the duration of the COVID-19 declaration under Section 564(b)(1) of the Act, 21 U.S.C. section 360bbb-3(b)(1), unless the authorization is terminated or revoked.     Resp Syncytial Virus by PCR NEGATIVE NEGATIVE Final    Comment: (NOTE) Fact Sheet for  Patients: BloggerCourse.com  Fact Sheet for Healthcare Providers: SeriousBroker.it  This test is not yet approved or cleared by the [place]  FDA and has been authorized for detection and/or diagnosis of SARS-CoV-2 by FDA under an Emergency Use Authorization (EUA). This EUA will remain in effect (meaning this test can be used) for the duration of the COVID-19 declaration under Section 564(b)(1) of the Act, 21 U.S.C. section 360bbb-3(b)(1), unless the authorization is terminated or revoked.  Performed at [facility] Lab, [address] [place] [address] [place], [place] [address]   Respiratory (~20 pathogens) panel by PCR     Status: None   Collection Time: 12/25/23  7:24 PM   Specimen:  Nasopharyngeal Swab; Respiratory  Result Value Ref Range Status   Adenovirus NOT DETECTED NOT DETECTED Final   Coronavirus 229E NOT DETECTED NOT DETECTED Final    Comment: (NOTE) The Coronavirus on the Respiratory Panel, DOES NOT test for the novel  Coronavirus (2019 nCoV)    Coronavirus HKU1 NOT DETECTED NOT DETECTED Final   Coronavirus NL63 NOT DETECTED NOT DETECTED Final   Coronavirus OC43 NOT DETECTED NOT DETECTED Final   Metapneumovirus NOT DETECTED NOT DETECTED Final   Rhinovirus / Enterovirus NOT DETECTED NOT DETECTED Final   Influenza A NOT DETECTED NOT DETECTED Final   Influenza B NOT DETECTED NOT DETECTED Final   Parainfluenza Virus 1 NOT DETECTED NOT DETECTED Final   Parainfluenza Virus 2 NOT DETECTED NOT DETECTED Final   Parainfluenza Virus 3 NOT DETECTED NOT DETECTED Final   Parainfluenza Virus 4 NOT DETECTED NOT DETECTED Final   Respiratory Syncytial Virus NOT DETECTED NOT DETECTED Final   Bordetella pertussis NOT DETECTED NOT DETECTED Final   Bordetella Parapertussis NOT DETECTED NOT DETECTED Final   Chlamydophila pneumoniae NOT DETECTED NOT DETECTED Final   Mycoplasma pneumoniae NOT DETECTED NOT DETECTED Final    Comment: Performed at  [facility] Lab, [address] [place], [place] [address]         Radiology Studies: VAS US  LOWER EXTREMITY VENOUS (DVT) (ONLY [facility] & [facility]) Result Date: 12/26/2023  Lower Venous DVT Study Patient Name:  ANZLEIGH SLAVEN  Date of Exam:   12/25/2023 Medical Rec #: [id number]           Accession #:    [id number] Date of Birth: 02-23-1946           Patient Gender: F Patient Age:   21 years Exam Location:  [facility] Procedure:      VAS US  LOWER EXTREMITY VENOUS (DVT) Referring Phys: BRITNI HENDERLY --------------------------------------------------------------------------------  Indications: Pain, and SOB. Left sided abdominal pain X 1 month, worsening overnight.  Limitations: Body habitus and poor ultrasound/tissue interface. Comparison Study: Prior negative left LEV done 10/10/20. Prior negative right LEV                   done 02/20/17 Performing Technologist: Alberta Lis RVS  Examination Guidelines: A complete evaluation includes B-mode imaging, spectral Doppler, color Doppler, and power Doppler as needed of all accessible portions of each vessel. Bilateral testing is considered an integral part of a complete examination. Limited examinations for reoccurring indications may be performed as noted. The reflux portion of the exam is performed with the patient in reverse Trendelenburg.  +---------+---------------+---------+-----------+----------+--------------+ RIGHT    CompressibilityPhasicitySpontaneityPropertiesThrombus Aging +---------+---------------+---------+-----------+----------+--------------+ CFV      Full           Yes      Yes                                 +---------+---------------+---------+-----------+----------+--------------+ SFJ      Full                                                        +---------+---------------+---------+-----------+----------+--------------+ FV Prox  Full           Yes      Yes                                  +---------+---------------+---------+-----------+----------+--------------+  FV Mid   Full           Yes      Yes                                 +---------+---------------+---------+-----------+----------+--------------+ FV DistalFull           Yes      Yes                                 +---------+---------------+---------+-----------+----------+--------------+ PFV      Full           Yes      Yes                                 +---------+---------------+---------+-----------+----------+--------------+ POP      Full           Yes      Yes                                 +---------+---------------+---------+-----------+----------+--------------+ PTV      Full                                                        +---------+---------------+---------+-----------+----------+--------------+ PERO     Full                                                        +---------+---------------+---------+-----------+----------+--------------+   +---------+---------------+---------+-----------+----------+-------------------+ LEFT     CompressibilityPhasicitySpontaneityPropertiesThrombus Aging      +---------+---------------+---------+-----------+----------+-------------------+ CFV      Full           Yes      Yes                                      +---------+---------------+---------+-----------+----------+-------------------+ SFJ      Full                                                             +---------+---------------+---------+-----------+----------+-------------------+ FV Prox  Full                                                             +---------+---------------+---------+-----------+----------+-------------------+ FV Mid   Full                                                             +---------+---------------+---------+-----------+----------+-------------------+  FV DistalFull           Yes      Yes                                       +---------+---------------+---------+-----------+----------+-------------------+ PFV      Full                                                             +---------+---------------+---------+-----------+----------+-------------------+ POP      Full           Yes      Yes                                      +---------+---------------+---------+-----------+----------+-------------------+ PTV      Full                                                             +---------+---------------+---------+-----------+----------+-------------------+ PERO                                                  Not well visualized +---------+---------------+---------+-----------+----------+-------------------+     Summary: BILATERAL: - No evidence of deep vein thrombosis seen in the lower extremities, bilaterally. -No evidence of popliteal cyst, bilaterally.   *See table(s) above for measurements and observations. Electronically signed by Lonni Gaskins MD on 12/26/2023 at 10:33:24 AM.    Final    CT CHEST ABDOMEN PELVIS WO CONTRAST Result Date: 12/25/2023 CLINICAL DATA:  Flank pain, chest pain, left basilar pneumonia on x-ray EXAM: CT CHEST, ABDOMEN AND PELVIS WITHOUT CONTRAST TECHNIQUE: Multidetector CT imaging of the chest, abdomen and pelvis was performed following the standard protocol without IV contrast. The examination was performed without intravenous contrast due to history of renal insufficiency. Assessment of the soft tissues, solid viscera, and vascular structures are severely limited. RADIATION DOSE REDUCTION: This exam was performed according to the departmental dose-optimization program which includes automated exposure control, adjustment of the mA and/or kV according to patient size and/or use of iterative reconstruction technique. COMPARISON:  12/25/2023, 12/03/2020 FINDINGS: CT CHEST FINDINGS Cardiovascular: Unenhanced imaging of the heart is unremarkable without  pericardial effusion. Normal caliber of the thoracic aorta. Extensive atherosclerosis of the aorta and coronary vasculature. Assessment of the vascular lumen cannot be performed without intravenous contrast. Mediastinum/Nodes: Mildly enlarged mediastinal lymph nodes measuring up to 13 mm in the subcarinal region, likely reactive. Thyroid , trachea, and esophagus are unremarkable. Lungs/Pleura: Dense left lower lobe airspace disease consistent with pneumonia, unchanged since preceding x-ray. Less pronounced areas of patchy consolidation are seen within the right upper lobe and lingula, which could reflect additional areas of pneumonia. No effusion or pneumothorax. Central airways are patent. Musculoskeletal: No acute or destructive bony abnormalities. Reconstructed images demonstrate no additional findings. CT ABDOMEN PELVIS FINDINGS Hepatobiliary:  Unremarkable unenhanced appearance of the liver. Prior cholecystectomy. No biliary duct dilation. Pancreas: Unremarkable unenhanced appearance. Spleen: Unremarkable unenhanced appearance. Adrenals/Urinary Tract: Mild bilateral renal cortical atrophy consistent with known history of chronic renal insufficiency. Extensive calcifications at the renal hila are felt to be vascular. No obstructive uropathy within either kidney. The adrenals and bladder are unremarkable. Stomach/Bowel: No bowel obstruction or ileus. Normal appendix right lower quadrant. Scattered sigmoid diverticulosis without diverticulitis. No bowel wall thickening or inflammatory change. Vascular/Lymphatic: Normal caliber of the abdominal aorta. Extensive atherosclerosis. Assessment of the vascular lumen cannot be performed without intravenous contrast. No pathologic adenopathy. Reproductive: Uterus and bilateral adnexa are unremarkable. Other: No free fluid or free intraperitoneal gas. No abdominal wall hernia. Musculoskeletal: No acute or destructive bony abnormalities. Reconstructed images demonstrate no  additional findings. IMPRESSION: 1. Dense left lower lobe pneumonia. Patchy airspace disease within the right upper lobe and lingula likely reflect additional foci of infection. 2. Reactive mediastinal lymphadenopathy. 3. No evidence of thoracoabdominal aortic aneurysm. Assessment of the vascular lumen cannot be performed without IV contrast. 4. Scattered sigmoid diverticulosis without diverticulitis. 5. Aortic Atherosclerosis (ICD10-I70.0). Coronary artery atherosclerosis. Electronically Signed   By: Ozell Daring M.D.   On: 12/25/2023 17:32   DG Chest 2 View Result Date: 12/25/2023 CLINICAL DATA:  Chest pain EXAM: CHEST - 2 VIEW COMPARISON:  03/24/2023 FINDINGS: Normal-sized heart. Interval patchy density at the left lung base. The remainder of the lungs are clear with normal vascularity. Tortuous and partially calcified thoracic aorta. Thoracic spine degenerative changes. Mild-to-moderate right glenohumeral degenerative changes. IMPRESSION: Left basilar pneumonia. Electronically Signed   By: Elspeth Bathe M.D.   On: 12/25/2023 12:29        Scheduled Meds:  amLODipine   5 mg Oral Daily   azithromycin  500 mg Oral Daily   dextromethorphan-guaiFENesin  1 tablet Oral BID   fluticasone furoate-vilanterol  1 puff Inhalation Daily   heparin   5,000 Units Subcutaneous Q8H   insulin  aspart  0-15 Units Subcutaneous TID WC   insulin  aspart  0-5 Units Subcutaneous QHS   insulin  glargine-yfgn  10 Units Subcutaneous BID   ipratropium-albuterol   3 mL Nebulization Q6H   ipratropium-albuterol   3 mL Nebulization STAT   metoprolol  tartrate  50 mg Oral BID   mirabegron  ER  50 mg Oral Daily   predniSONE   40 mg Oral Q breakfast   risperiDONE   3 mg Oral QHS   rosuvastatin   10 mg Oral Daily   Continuous Infusions:  sodium chloride  75 mL/hr at 12/26/23 0447   cefTRIAXone  (ROCEPHIN )  IV       LOS: 1 day    Time spent: 55 minutes.    Leatrice Chapel, MD  [facility] Pager #: [phone] 7PM-7AM contact night coverage as above

## 2023-12-27 DIAGNOSIS — J189 Pneumonia, unspecified organism: Secondary | ICD-10-CM | POA: Diagnosis not present

## 2023-12-27 DIAGNOSIS — A419 Sepsis, unspecified organism: Secondary | ICD-10-CM | POA: Diagnosis not present

## 2023-12-27 LAB — RENAL FUNCTION PANEL
Albumin: 2.9 g/dL — ABNORMAL LOW (ref 3.5–5.0)
Anion gap: 12 (ref 5–15)
BUN: 26 mg/dL — ABNORMAL HIGH (ref 8–23)
CO2: 23 mmol/L (ref 22–32)
Calcium: 9 mg/dL (ref 8.9–10.3)
Chloride: 104 mmol/L (ref 98–111)
Creatinine, Ser: 1.17 mg/dL — ABNORMAL HIGH (ref 0.44–1.00)
GFR, Estimated: 48 mL/min — ABNORMAL LOW (ref >60)
Glucose, Bld: 180 mg/dL — ABNORMAL HIGH (ref 70–99)
Phosphorus: 3 mg/dL (ref 2.5–4.6)
Potassium: 4 mmol/L (ref 3.5–5.1)
Sodium: 139 mmol/L (ref 135–145)

## 2023-12-27 LAB — GLUCOSE, CAPILLARY
Glucose-Capillary: 125 mg/dL — ABNORMAL HIGH (ref 70–99)
Glucose-Capillary: 136 mg/dL — ABNORMAL HIGH (ref 70–99)
Glucose-Capillary: 165 mg/dL — ABNORMAL HIGH (ref 70–99)
Glucose-Capillary: 154 mg/dL — ABNORMAL HIGH (ref 70–99)

## 2023-12-27 LAB — CBC WITH DIFFERENTIAL/PLATELET
Abs Immature Granulocytes: 0.15 K/uL — ABNORMAL HIGH (ref 0.00–0.07)
Basophils Absolute: 0 K/uL (ref 0.0–0.1)
Basophils Relative: 0 %
Eosinophils Absolute: 0 K/uL (ref 0.0–0.5)
Eosinophils Relative: 0 %
HCT: 31.2 % — ABNORMAL LOW (ref 36.0–46.0)
Hemoglobin: 10.2 g/dL — ABNORMAL LOW (ref 12.0–15.0)
Immature Granulocytes: 1 %
Lymphocytes Relative: 4 %
Lymphs Abs: 0.5 K/uL — ABNORMAL LOW (ref 0.7–4.0)
MCH: 35.2 pg — ABNORMAL HIGH (ref 26.0–34.0)
MCHC: 32.7 g/dL (ref 30.0–36.0)
MCV: 107.6 fL — ABNORMAL HIGH (ref 80.0–100.0)
Monocytes Absolute: 0.6 K/uL (ref 0.1–1.0)
Monocytes Relative: 5 %
Neutro Abs: 12.3 K/uL — ABNORMAL HIGH (ref 1.7–7.7)
Neutrophils Relative %: 90 %
Platelets: 322 K/uL (ref 150–400)
RBC: 2.9 MIL/uL — ABNORMAL LOW (ref 3.87–5.11)
RDW: 16.3 % — ABNORMAL HIGH (ref 11.5–15.5)
WBC: 13.7 K/uL — ABNORMAL HIGH (ref 4.0–10.5)
nRBC: 0 % (ref 0.0–0.2)

## 2023-12-27 LAB — EXPECTORATED SPUTUM ASSESSMENT W GRAM STAIN, RFLX TO RESP C

## 2023-12-27 LAB — MAGNESIUM: Magnesium: 2 mg/dL (ref 1.7–2.4)

## 2023-12-27 MED ORDER — IPRATROPIUM-ALBUTEROL 0.5-2.5 (3) MG/3ML IN SOLN: 3.0000 mL | Freq: Four times a day (QID) | RESPIRATORY_TRACT | Status: DC | PRN

## 2023-12-27 NOTE — Progress Notes (Signed)
 Cardiology Office Note:    Date:  01/10/2024   ID:  Marilyn Sexton, DOB 04-Jun-1945, MRN [id number]  PCP:  Duwaine Burnard Amble, NP   [facility] Providers Cardiologist:  None     Referring MD: Nichole Senior, MD   Chief Complaint  Patient presents with   Coronary Artery Disease    History of Present Illness:    Marilyn Sexton is a 78 y.o. female seen for follow up  of CAD and HTN. She has a history of HTN, DM on insulin , thyroid  disease. S/p remote MI in 2012. Underwent stenting of proximal to mid RCA with DES x 1. Last Myoview  in 2018 was normal.   Was recently admitted 10/18 for PNA multifocal by CT. Sepsis. Treated with antibiotics. DC on 10/21. Also treated with steroids and nebulizers for COPD exacerbation. Notes continued cough with white phlegm. No fever. Some SOB.   I take care of her husband. She states she is doing well from a cardiac standpoint. Denies any chest pain Quit smoking 25 years ago. Not currently on lipid lowering therapy. Notes LE swelling has improved.    Past Medical History:  Diagnosis Date   Arthritis    Diabetes mellitus without complication (HCC)    Dyspnea    Hypertension    Myocardial infarction [facility])    Thyroid  disease     Past Surgical History:  Procedure Laterality Date   ABDOMINAL AORTOGRAM W/LOWER EXTREMITY N/A 12/19/2020   Procedure: ABDOMINAL AORTOGRAM W/LOWER EXTREMITY;  Surgeon: Gretta Lonni PARAS, MD;  Location: [facility] INVASIVE CV LAB;  Service: Cardiovascular;  Laterality: N/A;   CARDIAC CATHETERIZATION     CHOLECYSTECTOMY     coronary stents     EYE SURGERY     bilateral cataracts   KNEE ARTHROSCOPY Right 12/16/2016   Procedure: RIGHT KNEE ARTHROSCOPY AND DEBRIDEMENT;  Surgeon: Harden Jerona GAILS, MD;  Location: [facility] OR;  Service: Orthopedics;  Laterality: Right;   TOTAL KNEE ARTHROPLASTY Right 12/31/2017   Procedure: RIGHT TOTAL KNEE ARTHROPLASTY;  Surgeon: Yvone Rush, MD;  Location: [facility] ORS;  Service: Orthopedics;   Laterality: Right;  Adductor Block   TUBAL LIGATION      Current Medications: Current Meds  Medication Sig   albuterol  (PROVENTIL ) (2.5 MG/3ML) 0.083% nebulizer solution Take 2.5 mg by nebulization every 6 (six) hours as needed for shortness of breath or wheezing.   aspirin  EC 81 MG tablet Take 81 mg by mouth at bedtime. Swallow whole.   benazepril  (LOTENSIN ) 20 MG tablet Take 20 mg by mouth 2 (two) times daily.   benztropine  (COGENTIN ) 0.5 MG tablet Take 0.5 mg by mouth in the morning.   Cyanocobalamin  (VITAMIN DEFICIENCY SYSTEM-B12) 1000 MCG/ML KIT Inject 1,000 mcg into the skin every 30 (thirty) days.   divalproex (DEPAKOTE ER) 500 MG 24 hr tablet Take 500 mg by mouth at bedtime.   Doxepin  HCl 6 MG TABS Take 6 mg by mouth at bedtime as needed (for insomnia).   insulin  lispro protamine-lispro (HUMALOG MIX 75/25) (75-25) 100 UNIT/ML SUSP injection Inject 15 Units into the skin See admin instructions. Inject 15 units into the skin before breakfast and after supper   Ipratropium-Albuterol  (COMBIVENT) 20-100 MCG/ACT AERS respimat Inhale 1 puff into the lungs every 6 (six) hours.   LORazepam  (ATIVAN ) 0.5 MG tablet Take 0.5 mg by mouth daily as needed for sleep or anxiety.   metoprolol  tartrate (LOPRESSOR ) 50 MG tablet Take 50 mg by mouth 2 (two) times daily.   mirabegron  ER (  MYRBETRIQ ) 50 MG TB24 tablet Take 50 mg by mouth daily.    nitroGLYCERIN  (NITROSTAT ) 0.4 MG SL tablet Place 1 tablet (0.4 mg total) under the tongue every 5 (five) minutes x 3 doses as needed for chest pain.   Propylene Glycol 0.6 % SOLN Place 1 drop into both eyes 2 (two) times daily as needed (for dryness).   risperiDONE  (RISPERDAL ) 3 MG tablet Take 3 mg by mouth at bedtime.   rosuvastatin  (CRESTOR ) 10 MG tablet Take 1 tablet (10 mg total) by mouth daily.   torsemide (DEMADEX) 20 MG tablet Take 20 mg by mouth every Monday, Wednesday, and Friday.   TRELEGY ELLIPTA 100-62.5-25 MCG/ACT AEPB Inhale 1 puff into the lungs daily  as needed (for respiratory flares).   TYLENOL  500 MG tablet Take 1,000 mg by mouth every 6 (six) hours as needed for mild pain (pain score 1-3) (or headaches).   Vitamin D, Ergocalciferol, (DRISDOL) 1.25 MG (50000 UNIT) CAPS capsule Take 50,000 Units by mouth every Monday.     Allergies:   Patient has no known allergies.   Social History   Socioeconomic History   Marital status: Married    Spouse name: Not on file   Number of children: Not on file   Years of education: Not on file   Highest education level: Not on file  Occupational History   Not on file  Tobacco Use   Smoking status: Former    Current packs/day: 0.00    Average packs/day: 1 pack/day for 44.0 years (44.0 ttl pk-yrs)    Types: Cigarettes    Start date: 9    Quit date: 2006    Years since quitting: 19.8   Smokeless tobacco: Never  Vaping Use   Vaping status: Never Used  Substance and Sexual Activity   Alcohol use: No   Drug use: No   Sexual activity: Not on file  Other Topics Concern   Not on file  Social History Narrative   Not on file   Social Drivers of Health   Financial Resource Strain: Not on file  Food Insecurity: No Food Insecurity (12/26/2023)   Hunger Vital Sign    Worried About Running Out of Food in the Last Year: Never true    Ran Out of Food in the Last Year: Never true  Transportation Needs: No Transportation Needs (12/26/2023)   PRAPARE - Administrator, Civil Service (Medical): No    Lack of Transportation (Non-Medical): No  Physical Activity: Not on file  Stress: Not on file  Social Connections: Socially Integrated (12/26/2023)   Social Connection and Isolation Panel    Frequency of Communication with Friends and Family: Twice a week    Frequency of Social Gatherings with Friends and Family: Twice a week    Attends Religious Services: More than 4 times per year    Active Member of [employer] or Organizations: No    Attends Engineer, Structural: More than 4  times per year    Marital Status: Married     Family History: The patient's family history is not on file.  ROS:   Please see the history of present illness.     All other systems reviewed and are negative.  EKGs/Labs/Other Studies Reviewed:    The following studies were reviewed today:          Recent Labs: 12/25/2023: ALT 10; B Natriuretic Peptide 192.3 12/27/2023: BUN 26; Creatinine, Ser 1.17; Hemoglobin 10.2; Magnesium  2.0; Platelets 322; Potassium 4.0;  Sodium 139  Recent Lipid Panel    Component Value Date/Time   CHOL 109 10/19/2023 0949   TRIG 92 10/19/2023 0949   HDL 43 10/19/2023 0949   CHOLHDL 2.5 10/19/2023 0949   CHOLHDL 3.0 08/16/2017 0613   VLDL 40 08/16/2017 0613   LDLCALC 48 10/19/2023 0949     Risk Assessment/Calculations:                Physical Exam:    VS:  BP (!) 130/58 (BP Location: Left Arm, Cuff Size: Large)   Pulse 73   Ht 5' 5 (1.651 m)   Wt 203 lb 4 oz (92.2 kg)   SpO2 94%   BMI 33.82 kg/m     Wt Readings from Last 3 Encounters:  01/10/24 203 lb 4 oz (92.2 kg)  12/26/23 210 lb 8 oz (95.5 kg)  07/20/23 211 lb 14.4 oz (96.1 kg)     GEN:  Well nourished, obese in no acute distress HEENT: Normal NECK: No JVD; No carotid bruits LYMPHATICS: No lymphadenopathy CARDIAC: RRR, no murmurs, rubs, gallops RESPIRATORY:  Clear to auscultation without rales, wheezing or rhonchi  ABDOMEN: Soft, non-tender, non-distended MUSCULOSKELETAL:  No edema; No deformity SKIN: Warm and dry NEUROLOGIC:  Alert and oriented x 3 PSYCHIATRIC:  Normal affect   ASSESSMENT:    1. Coronary artery disease of native artery of native heart with stable angina pectoris   2. Hypertension, unspecified type   3. Type 2 diabetes mellitus without complication, with long-term current use of insulin  (HCC)     PLAN:    In order of problems listed above:  CAD remote MI in 2012 with stenting of the RCA. Normal Myoview  in 2018. No active cardiac symptoms.  Continue medical therapy with metoprolol , ASA.  DM on insulin  HLD. LDL 48 is at goal on Crestor .  LE edema. Recommend sodium restriction and compression hose. Looks good today Remote tobacco use COPD. Recent exacerbation due to multifocal PNA. Complete antibiotic course.         I will follow up in one  year   Medication Adjustments/Labs and Tests Ordered: Current medicines are reviewed at length with the patient today.  Concerns regarding medicines are outlined above.  No orders of the defined types were placed in this encounter.  No orders of the defined types were placed in this encounter.   Patient Instructions  Medication Instructions:  Continue same medications *If you need a refill on your cardiac medications before your next appointment, please call your pharmacy*  Lab Work: None ordered  Testing/Procedures: None ordered  Follow-Up: At [facility], you and your health needs are our priority.  As part of our continuing mission to provide you with exceptional heart care, our providers are all part of one team.  This team includes your primary Cardiologist (physician) and Advanced Practice Providers or APPs (Physician Assistants and Nurse Practitioners) who all work together to provide you with the care you need, when you need it.  Your next appointment:  1 year   Call in July to schedule Nov appointment     Provider:  Dr.Leeandra Ellerson   We recommend signing up for the patient portal called MyChart.  Sign up information is provided on this After Visit Summary.  MyChart is used to connect with patients for Virtual Visits (Telemedicine).  Patients are able to view lab/test results, encounter notes, upcoming appointments, etc.  Non-urgent messages can be sent to your provider as well.   To learn more about  what you can do with MyChart, go to [email or web address].        Signed, Madeline Bebout, MD  01/10/2024 11:26 AM    [facility]

## 2023-12-27 NOTE — Progress Notes (Signed)
 PROGRESS NOTE    Marilyn Sexton  FMW:[id number] DOB: Jul 08, 1945 DOA: 12/25/2023 PCP: Duwaine Burnard Amble, NP  Outpatient Specialists:     Brief Narrative:  Patient is a 78 year old female with past medical history significant for type 2 diabetes mellitus, hypertension, MI/CAD, CKD 3B, bladder spasms, bipolar disorder, insomnia, COPD and arthritis.  Patient presented with cough and left-sided upper abdominal/lower chest pain.  CT scan of the chest revealed multifocal pneumonia, mainly at the left lung base.  Cultures are pending.  Patient is currently on antibiotics.  12/26/2023: Patient seen.  Patient tells me that she started smoking cigarettes at the age of 49, and quit about 20 years ago.  Patient smoked 1 pack of cigarettes daily.  Will continue management for COPD exacerbation, sepsis/pneumonia.  BMP done today revealed sodium of 136, potassium of 3.8, chloride 102, CO2 21, BUN of 23, serum creatinine of 1.24 and blood sugar of 189.  CBC revealed WBC of 15.8, hemoglobin of 10.1, hematocrit of 29.8 and platelet count of 229.  12/27/2023: Patient seen.  Patient seems to be improving.  Continue IV antibiotics for now.  Follow cultures.   Assessment & Plan:   Principal Problem:   Sepsis due to pneumonia [facility]) Active Problems:   Acute renal failure superimposed on stage 3b chronic kidney disease (HCC)   Multifocal pneumonia   Generalized weakness   Essential hypertension   Bipolar disorder (HCC)   Sepsis: Community-acquired pneumonia: - Pt presented with worsening cough and mild shortness of breath - Chest x-ray revealed left basilar pneumonia.   -CT chest revealed multifocal pneumonia. - Met sepsis criteria with tachycardia, tachypnea, leukocytosis, lactic acidosis and evidence of respiratory infection - Continue IV Rocephin  and azithromycin - Negative respiratory panel, follow-up MRSA screen, procalcitonin, urinary Legionella and strep pneumo - Follow-up blood culture and  sputum culture - WBC 17.9-15.8-13.7. - Incentive spirometer, flutter valve - Supplemental O2 as needed   COPD exacerbation - Presented with worsening cough, wheezing and shortness of breath - Continue prednisone , DuoNeb Q6 hourly and Breo Ellipta.  Continue azithromycin.  I -Continue prednisone  40 mg p.o. once daily. 12/27/2023: Respiratory symptoms are improving.   Type 2 diabetes with hyperglycemia -A1c of 5%. - Continue subcutaneous Semglee  10 units twice daily, subcutaneous NovoLog  5 units with meals 3 times daily and sliding scale insulin  coverage.   - Per spouse, patient was on Humalog mix 75/25 15 units twice daily   AKI on CKD 3B - Creatinine was elevated on presentation (1.83). - Baseline serum creatinine of 1.2-1.5 - Likely secondary to dehydration and acute infection - Serum creatinine is improving.  Serum creatinine of 1.17 today.- -Continue to monitor renal function and electrolytes - Avoid nephrotoxic meds   Hypertension: -Continue to monitor and optimize.   Bipolar disorder - Continue risperidone  - Stable.  Hyperlipidemia:  - Continue rosuvastatin    Bladder spasms - Continue Myrbetriq    Generalized weakness - In the setting of acute illness - PT/OT eval and treat   DVT prophylaxis: Subcutaneous heparin  Code Status: DO NOT RESUSCITATE Family Communication:  Disposition Plan: Inpatient   Consultants:  None.  Procedures:  None.  Antimicrobials:  IV Rocephin . IV azithromycin   Subjective: No new complaints  Objective: Vitals:   12/27/23 1544 12/27/23 1545 12/27/23 1600 12/27/23 1700  BP: (!) 168/79     Pulse: (!) 118 (!) 114 (!) 110 (!) 118  Resp: 20     Temp: 98 F (36.7 C)     TempSrc: Oral  SpO2: 90% 93% 92% 100%  Weight:      Height:        Intake/Output Summary (Last 24 hours) at 12/27/2023 1905 Last data filed at 12/27/2023 1028 Gross per 24 hour  Intake 360 ml  Output 2275 ml  Net -1915 ml   Filed Weights    12/25/23 1125 12/26/23 1339  Weight: 91.2 kg 95.5 kg    Examination:  General exam: Appears calm and comfortable.  Patient is obese. Respiratory system: Decreased air entry with expiratory wheeze. Cardiovascular system: S1 & S2 heard Gastrointestinal system: Abdomen is obese, soft and nontender.  Central nervous system: Awake and alert.   Extremities: Leg edema  Data Reviewed: I have personally reviewed following labs and imaging studies  CBC: Recent Labs  Lab 12/25/23 1148 12/26/23 0438 12/27/23 1630  WBC 17.9* 15.8* 13.7*  NEUTROABS  --   --  12.3*  HGB 10.4* 10.1* 10.2*  HCT 31.1* 29.8* 31.2*  MCV 108.0* 107.6* 107.6*  PLT 261 229 322   Basic Metabolic Panel: Recent Labs  Lab 12/25/23 1148 12/26/23 0438 12/27/23 1630  NA 136 136 139  K 3.6 3.8 4.0  CL 100 102 104  CO2 21* 21* 23  GLUCOSE 252* 189* 180*  BUN 28* 23 26*  CREATININE 1.83* 1.24* 1.17*  CALCIUM  8.6* 8.5* 9.0  MG  --   --  2.0  PHOS  --   --  3.0   GFR: Estimated Creatinine Clearance: 46.8 mL/min (A) (by C-G formula based on SCr of 1.17 mg/dL (H)). Liver Function Tests: Recent Labs  Lab 12/25/23 1153 12/27/23 1630  AST 15  --   ALT 10  --   ALKPHOS 66  --   BILITOT 1.4*  --   PROT 6.1*  --   ALBUMIN 3.0* 2.9*   No results for input(s): LIPASE, AMYLASE in the last 168 hours. No results for input(s): AMMONIA in the last 168 hours. Coagulation Profile: No results for input(s): INR, PROTIME in the last 168 hours. Cardiac Enzymes: No results for input(s): CKTOTAL, CKMB, CKMBINDEX, TROPONINI in the last 168 hours. BNP (last 3 results) No results for input(s): PROBNP in the last 8760 hours. HbA1C: Recent Labs    12/26/23 0438  HGBA1C 5.0   CBG: Recent Labs  Lab 12/26/23 1632 12/26/23 2118 12/27/23 0743 12/27/23 1121 12/27/23 1554  GLUCAP 138* 197* 125* 136* 165*   Lipid Profile: No results for input(s): CHOL, HDL, LDLCALC, TRIG, CHOLHDL,  LDLDIRECT in the last 72 hours. Thyroid  Function Tests: No results for input(s): TSH, T4TOTAL, FREET4, T3FREE, THYROIDAB in the last 72 hours. Anemia Panel: No results for input(s): VITAMINB12, FOLATE, FERRITIN, TIBC, IRON, RETICCTPCT in the last 72 hours. Urine analysis:    Component Value Date/Time   COLORURINE YELLOW 12/25/2023 1516   APPEARANCEUR HAZY (A) 12/25/2023 1516   LABSPEC 1.022 12/25/2023 1516   PHURINE 5.0 12/25/2023 1516   GLUCOSEU >=500 (A) 12/25/2023 1516   HGBUR NEGATIVE 12/25/2023 1516   BILIRUBINUR NEGATIVE 12/25/2023 1516   KETONESUR NEGATIVE 12/25/2023 1516   PROTEINUR NEGATIVE 12/25/2023 1516   UROBILINOGEN 0.2 07/20/2014 0732   NITRITE NEGATIVE 12/25/2023 1516   LEUKOCYTESUR NEGATIVE 12/25/2023 1516   Sepsis Labs: @LABRCNTIP (procalcitonin:4,lacticidven:4)  ) Recent Results (from the past 240 hours)  Blood culture (routine x 2)     Status: None (Preliminary result)   Collection Time: 12/25/23  3:50 PM   Specimen: BLOOD LEFT FOREARM  Result Value Ref Range Status   Specimen Description BLOOD  LEFT FOREARM  Final   Special Requests   Final    BOTTLES DRAWN AEROBIC AND ANAEROBIC Blood Culture results may not be optimal due to an inadequate volume of blood received in culture bottles   Culture   Final    NO GROWTH 2 DAYS Performed at [facility], [address] [place], [place] [address]    Report Status PENDING  Incomplete  Blood culture (routine x 2)     Status: None (Preliminary result)   Collection Time: 12/25/23  4:37 PM   Specimen: BLOOD  Result Value Ref Range Status   Specimen Description BLOOD SITE NOT SPECIFIED  Final   Special Requests   Final    BOTTLES DRAWN AEROBIC AND ANAEROBIC Blood Culture results may not be optimal due to an inadequate volume of blood received in culture bottles   Culture   Final    NO GROWTH 2 DAYS Performed at [facility], [address] [place], [place] [address]    Report  Status PENDING  Incomplete  Resp panel by RT-PCR (RSV, Flu A&B, Covid) Anterior Nasal Swab     Status: None   Collection Time: 12/25/23  7:24 PM   Specimen: Anterior Nasal Swab  Result Value Ref Range Status   SARS Coronavirus 2 by RT PCR NEGATIVE NEGATIVE Final   Influenza A by PCR NEGATIVE NEGATIVE Final   Influenza B by PCR NEGATIVE NEGATIVE Final    Comment: (NOTE) The Xpert Xpress SARS-CoV-2/FLU/RSV plus assay is intended as an aid in the diagnosis of influenza from Nasopharyngeal swab specimens and should not be used as a sole basis for treatment. Nasal washings and aspirates are unacceptable for Xpert Xpress SARS-CoV-2/FLU/RSV testing.  Fact Sheet for Patients: BloggerCourse.com  Fact Sheet for Healthcare Providers: SeriousBroker.it  This test is not yet approved or cleared by the [place]  FDA and has been authorized for detection and/or diagnosis of SARS-CoV-2 by FDA under an Emergency Use Authorization (EUA). This EUA will remain in effect (meaning this test can be used) for the duration of the COVID-19 declaration under Section 564(b)(1) of the Act, 21 U.S.C. section 360bbb-3(b)(1), unless the authorization is terminated or revoked.     Resp Syncytial Virus by PCR NEGATIVE NEGATIVE Final    Comment: (NOTE) Fact Sheet for Patients: BloggerCourse.com  Fact Sheet for Healthcare Providers: SeriousBroker.it  This test is not yet approved or cleared by the [place]  FDA and has been authorized for detection and/or diagnosis of SARS-CoV-2 by FDA under an Emergency Use Authorization (EUA). This EUA will remain in effect (meaning this test can be used) for the duration of the COVID-19 declaration under Section 564(b)(1) of the Act, 21 U.S.C. section 360bbb-3(b)(1), unless the authorization is terminated or revoked.  Performed at [facility], [address] [place], [place] [address]   Respiratory (~20 pathogens) panel by PCR     Status: None   Collection Time: 12/25/23  7:24 PM   Specimen: Nasopharyngeal Swab; Respiratory  Result Value Ref Range Status   Adenovirus NOT DETECTED NOT DETECTED Final   Coronavirus 229E NOT DETECTED NOT DETECTED Final    Comment: (NOTE) The Coronavirus on the Respiratory Panel, DOES NOT test for the novel  Coronavirus (2019 nCoV)    Coronavirus HKU1 NOT DETECTED NOT DETECTED Final   Coronavirus NL63 NOT DETECTED NOT DETECTED Final   Coronavirus OC43 NOT DETECTED NOT DETECTED Final   Metapneumovirus NOT DETECTED NOT DETECTED Final   Rhinovirus /  Enterovirus NOT DETECTED NOT DETECTED Final   Influenza A NOT DETECTED NOT DETECTED Final   Influenza B NOT DETECTED NOT DETECTED Final   Parainfluenza Virus 1 NOT DETECTED NOT DETECTED Final   Parainfluenza Virus 2 NOT DETECTED NOT DETECTED Final   Parainfluenza Virus 3 NOT DETECTED NOT DETECTED Final   Parainfluenza Virus 4 NOT DETECTED NOT DETECTED Final   Respiratory Syncytial Virus NOT DETECTED NOT DETECTED Final   Bordetella pertussis NOT DETECTED NOT DETECTED Final   Bordetella Parapertussis NOT DETECTED NOT DETECTED Final   Chlamydophila pneumoniae NOT DETECTED NOT DETECTED Final   Mycoplasma pneumoniae NOT DETECTED NOT DETECTED Final    Comment: Performed at [facility], [address], [place], [place] [address]  MRSA Next Gen by PCR, Nasal     Status: None   Collection Time: 12/25/23  8:13 PM   Specimen: Nasal Mucosa; Nasal Swab  Result Value Ref Range Status   MRSA by PCR Next Gen NOT DETECTED NOT DETECTED Final    Comment: (NOTE) The GeneXpert MRSA Assay (FDA approved for NASAL specimens only), is one component of a comprehensive MRSA colonization surveillance program. It is not intended to diagnose MRSA infection nor to guide or monitor treatment for MRSA infections. Test performance is not FDA approved in patients less than 37  years old. Performed at [facility], [address], [place], [place] [address]          Radiology Studies: No results found.       Scheduled Meds:  amLODipine   5 mg Oral Daily   azithromycin  500 mg Oral Daily   dextromethorphan-guaiFENesin  1 tablet Oral BID   fluticasone furoate-vilanterol  1 puff Inhalation Daily   heparin   5,000 Units Subcutaneous Q8H   insulin  aspart  0-15 Units Subcutaneous TID WC   insulin  aspart  0-5 Units Subcutaneous QHS   insulin  glargine-yfgn  10 Units Subcutaneous BID   metoprolol  tartrate  50 mg Oral BID   mirabegron  ER  50 mg Oral Daily   predniSONE   40 mg Oral Q breakfast   risperiDONE   3 mg Oral QHS   rosuvastatin   10 mg Oral Daily   Continuous Infusions:  cefTRIAXone  (ROCEPHIN )  IV Stopped (12/26/23 1338)     LOS: 2 days    Time spent: 55 minutes.    Leatrice Chapel, MD  [facility] Pager #: [phone] 7PM-7AM contact night coverage as above

## 2023-12-27 NOTE — Progress Notes (Signed)
 Mobility Specialist Progress Note;   12/27/23 1250  Mobility  Activity Pivoted/transferred from chair to bed  Level of Assistance Minimal assist, patient does 75% or more  Assistive Device Front wheel walker  Distance Ambulated (ft) 7 ft  Activity Response Tolerated well  Mobility Referral Yes  Mobility visit 1 Mobility  Mobility Specialist Start Time (ACUTE ONLY) 1250  Mobility Specialist Stop Time (ACUTE ONLY) 1300  Mobility Specialist Time Calculation (min) (ACUTE ONLY) 10 min   Pt requesting assistance back to bed. Required MinA to stand from chair and transfer to [facility], void successful, then to bed. Deferred further mobility at this time d/t fatigue. Dyspnea displayed, however VSS on RA. Pt left comfortably in bed with all needs met, alarm on.   Lauraine Erm Mobility Specialist Please contact via SecureChat or [employer] [phone]

## 2023-12-27 NOTE — Plan of Care (Signed)

## 2023-12-27 NOTE — Plan of Care (Signed)
  Problem: Clinical Measurements: Goal: Respiratory complications will improve Outcome: Progressing Goal: Cardiovascular complication will be avoided Outcome: Progressing   Problem: Activity: Goal: Risk for activity intolerance will decrease Outcome: Progressing   Problem: Nutrition: Goal: Adequate nutrition will be maintained Outcome: Progressing   Problem: Elimination: Goal: Will not experience complications related to urinary retention Outcome: Progressing   Problem: Safety: Goal: Ability to remain free from injury will improve Outcome: Progressing   Problem: Activity: Goal: Ability to tolerate increased activity will improve Outcome: Progressing   Problem: Respiratory: Goal: Ability to maintain a clear airway will improve Outcome: Progressing

## 2023-12-27 NOTE — Evaluation (Signed)
 Occupational Therapy Evaluation Patient Details Name: Marilyn Sexton MRN: [id number] DOB: 1945-04-08 Today's Date: 12/27/2023   History of Present Illness   78 y.o. female presents to [facility] 12/25/23 with cough and L sided abdominal pain. Admitted with sepsis 2/2 PNA and COPD exacerbation. PMHx: T2DM, HTN, MI/CAD, CKD 3B, bladder spasms, bipolar disorder, insomnia, COPD and arthritis     Clinical Impressions Pt ind at baseline with ADLs/functional mobility, lives with spouse who can assist at d/c. Pt currently needs up to mod A for ADLs, CGA for bed mobility and CGA for transfers without AD. Pt reaching out for external support intermittently walking short distance in room to [facility] and then around to chair. Pt with difficulty donning grey socks for LB ADL. Pt presenting with impairments listed below, will follow acutely to maximize safety/ind with ADL/functional mobility.     If plan is discharge home, recommend the following:   A little help with walking and/or transfers;A little help with bathing/dressing/bathroom;Assistance with cooking/housework;Assist for transportation;A lot of help with bathing/dressing/bathroom     Functional Status Assessment   Patient has had a recent decline in their functional status and demonstrates the ability to make significant improvements in function in a reasonable and predictable amount of time.     Equipment Recommendations   None recommended by OT     Recommendations for Other Services   PT consult     Precautions/Restrictions   Precautions Precautions: Fall Recall of Precautions/Restrictions: Intact Precaution/Restrictions Comments: watch O2 Restrictions Weight Bearing Restrictions Per Provider Order: No     Mobility Bed Mobility Overal bed mobility: Needs Assistance Bed Mobility: Supine to Sit     Supine to sit: Contact guard          Transfers Overall transfer level: Needs assistance Equipment used: 1 person  hand held assist Transfers: Sit to/from Stand Sit to Stand: Contact guard assist                  Balance Overall balance assessment: Needs assistance Sitting-balance support: No upper extremity supported, Feet supported Sitting balance-Leahy Scale: Good     Standing balance support: Bilateral upper extremity supported, During functional activity, Reliant on assistive device for balance Standing balance-Leahy Scale: Fair Standing balance comment: static standing without AD                           ADL either performed or assessed with clinical judgement   ADL Overall ADL's : Needs assistance/impaired Eating/Feeding: Set up;Sitting   Grooming: Set up;Sitting   Upper Body Bathing: Minimal assistance;Sitting   Lower Body Bathing: Sitting/lateral leans;Moderate assistance   Upper Body Dressing : Minimal assistance;Sitting   Lower Body Dressing: Moderate assistance;Sitting/lateral leans   Toilet Transfer: Contact guard assist   Toileting- Clothing Manipulation and Hygiene: Contact guard assist;Sitting/lateral lean       Functional mobility during ADLs: Contact guard assist       Vision   Vision Assessment?: No apparent visual deficits     Perception Perception: Not tested       Praxis Praxis: Not tested       Pertinent Vitals/Pain Pain Assessment Pain Assessment: No/denies pain     Extremity/Trunk Assessment Upper Extremity Assessment Upper Extremity Assessment: Generalized weakness   Lower Extremity Assessment Lower Extremity Assessment: Defer to PT evaluation   Cervical / Trunk Assessment Cervical / Trunk Assessment: Normal   Communication Communication Communication: No apparent difficulties   Cognition Arousal: Alert Behavior During  Therapy: WFL for tasks assessed/performed Cognition: No apparent impairments                               Following commands: Intact       Cueing  General Comments   Cueing  Techniques: Verbal cues  VSS   Exercises     Shoulder Instructions      Home Living Family/patient expects to be discharged to:: Private residence Living Arrangements: Spouse/significant other Available Help at Discharge: Family;Available 24 hours/day Type of Home: House Home Access: Stairs to enter;Ramped entrance     Home Layout: One level     Bathroom Shower/Tub: Producer, television/film/video: Standard     Home Equipment: Grab bars - tub/shower;Shower seat;Wheelchair - Forensic psychologist (2 wheels);BSC/3in1          Prior Functioning/Environment Prior Level of Function : Independent/Modified Independent;Driving             Mobility Comments: ambulates without AD ADLs Comments: ind    OT Problem List: Decreased strength;Decreased range of motion;Decreased activity tolerance;Impaired balance (sitting and/or standing)   OT Treatment/Interventions: Self-care/ADL training;Therapeutic exercise;Energy conservation;DME and/or AE instruction;Therapeutic activities;Patient/family education;Balance training      OT Goals(Current goals can be found in the care plan section)   Acute Rehab OT Goals Patient Stated Goal: none stated OT Goal Formulation: With patient Time For Goal Achievement: 01/10/24 Potential to Achieve Goals: Good ADL Goals Pt Will Perform Upper Body Dressing: with modified independence;sitting Pt Will Perform Lower Body Dressing: with supervision;sitting/lateral leans;sit to/from stand Pt Will Transfer to Toilet: with supervision;ambulating;regular height toilet Additional ADL Goal #1: pt will tolerate OOB standing activity x10 min in order to improve activity tolerance for ADLs   OT Frequency:  Min 2X/week    Co-evaluation              AM-PAC OT 6 Clicks Daily Activity     Outcome Measure Help from another person eating meals?: None Help from another person taking care of personal grooming?: A Little Help from another person  toileting, which includes using toliet, bedpan, or urinal?: A Little Help from another person bathing (including washing, rinsing, drying)?: A Lot Help from another person to put on and taking off regular upper body clothing?: A Little Help from another person to put on and taking off regular lower body clothing?: A Lot 6 Click Score: 17   End of Session Equipment Utilized During Treatment: Gait belt Nurse Communication: Mobility status  Activity Tolerance: Patient tolerated treatment well Patient left: in chair;with call bell/phone within reach;with chair alarm set  OT Visit Diagnosis: Unsteadiness on feet (R26.81);Other abnormalities of gait and mobility (R26.89);Muscle weakness (generalized) (M62.81)                Time: 9195-9172 OT Time Calculation (min): 23 min Charges:  OT General Charges $OT Visit: 1 Visit OT Evaluation $OT Eval Low Complexity: 1 Low OT Treatments $Self Care/Home Management : 8-22 mins  Laurajean Hosek K, OTD, OTR/L [employer] Preferred Acute Rehab [phone]   Janiyla Long K Koonce 12/27/2023, 10:09 AM

## 2023-12-28 ENCOUNTER — Other Ambulatory Visit (HOSPITAL_COMMUNITY): Payer: Self-pay

## 2023-12-28 DIAGNOSIS — N1832 Chronic kidney disease, stage 3b: Secondary | ICD-10-CM | POA: Diagnosis not present

## 2023-12-28 DIAGNOSIS — N179 Acute kidney failure, unspecified: Secondary | ICD-10-CM | POA: Diagnosis not present

## 2023-12-28 DIAGNOSIS — J189 Pneumonia, unspecified organism: Secondary | ICD-10-CM | POA: Diagnosis not present

## 2023-12-28 DIAGNOSIS — A419 Sepsis, unspecified organism: Secondary | ICD-10-CM | POA: Diagnosis not present

## 2023-12-28 LAB — GLUCOSE, CAPILLARY
Glucose-Capillary: 113 mg/dL — ABNORMAL HIGH (ref 70–99)
Glucose-Capillary: 104 mg/dL — ABNORMAL HIGH (ref 70–99)

## 2023-12-28 MED ORDER — AMLODIPINE BESYLATE 10 MG PO TABS
10.0000 mg | ORAL_TABLET | Freq: Every day | ORAL | Status: DC
  Administered 2023-12-28: 10 mg via ORAL
  Filled 2023-12-28: qty 1

## 2023-12-28 MED ORDER — AMOXICILLIN 875 MG PO TABS
875.0000 mg | ORAL_TABLET | Freq: Two times a day (BID) | ORAL | 0 refills | Status: AC
Start: 2023-12-29 — End: 2024-01-01
  Filled 2023-12-28: qty 6, 3d supply, fill #0

## 2023-12-28 MED ORDER — DOXYCYCLINE HYCLATE 100 MG PO TABS
100.0000 mg | ORAL_TABLET | Freq: Two times a day (BID) | ORAL | 0 refills | Status: AC
  Filled 2023-12-28: qty 6, 3d supply, fill #0

## 2023-12-28 MED ORDER — HYDRALAZINE HCL 20 MG/ML IJ SOLN
2.0000 mg | INTRAMUSCULAR | Status: DC | PRN
  Filled 2023-12-28: qty 1

## 2023-12-28 MED ORDER — AMLODIPINE BESYLATE 5 MG PO TABS
5.0000 mg | ORAL_TABLET | ORAL | Status: AC
  Administered 2023-12-28: 5 mg via ORAL
  Filled 2023-12-28: qty 1

## 2023-12-28 MED ORDER — METOPROLOL TARTRATE 5 MG/5ML IV SOLN
2.5000 mg | INTRAVENOUS | Status: AC
  Administered 2023-12-28: 2.5 mg via INTRAVENOUS
  Filled 2023-12-28: qty 5

## 2023-12-28 NOTE — Discharge Summary (Signed)
 Physician Discharge Summary   Marilyn Sexton FMW:[id number] DOB: [birth year] DOA: 12/25/2023  PCP: Duwaine Burnard Amble, NP  Admit date: 12/25/2023 Discharge date: 12/28/2023  Admitted From: Home Disposition: Home Discharging physician: Alm Apo, MD Barriers to discharge: None  Discharge Condition: stable CODE STATUS: DNR Diet recommendation:  Diet Orders (From admission, onward)     Start     Ordered   12/28/23 0000  Diet Carb Modified        12/28/23 1208   12/25/23 2013  Diet Carb Modified Fluid consistency: Thin; Room service appropriate? Yes  Diet effective now       Question Answer Comment  Diet-HS Snack? Nothing   Calorie Level Medium 1600-2000   Fluid consistency: Thin   Room service appropriate? Yes      12/25/23 2012            Hospital Course: Patient is a 78 year old female with past medical history significant for type 2 diabetes mellitus, hypertension, MI/CAD, CKD 3B, bladder spasms, bipolar disorder, insomnia, COPD and arthritis.  Patient presented with cough and left-sided upper abdominal/lower chest pain.  CT scan of the chest revealed multifocal pneumonia, mainly at the left lung base.       Assessment & Plan:   Sepsis: Community-acquired pneumonia: - Pt presented with worsening cough and mild shortness of breath - Chest x-ray revealed left basilar pneumonia.   -CT chest revealed multifocal pneumonia. - Met sepsis criteria with tachycardia, tachypnea, leukocytosis, lactic acidosis and evidence of respiratory infection - s/p Rocephin  and azithromycin; transitioned to amoxicillin and doxycycline  to complete course at discharge   COPD exacerbation -resolved - Presented with worsening cough, wheezing and shortness of breath -Treated with prednisone , nebulizers, and antibiotics   Type 2 diabetes with hyperglycemia - Resume home regimen   AKI on CKD 3B - Creatinine was elevated on presentation (1.83). - Baseline serum creatinine of 1.2-1.5 -  Likely secondary to dehydration and acute infection - Serum creatinine is improving.    Hypertension: -Continue home regimen    Bipolar disorder - Continue risperidone    Hyperlipidemia:  - Continue rosuvastatin    Bladder spasms - Continue Myrbetriq    Generalized weakness - In the setting of acute illness - PT/OT eval and treat -Home health PT at discharge   The patient's acute and chronic medical conditions were treated accordingly. On day of discharge, patient was felt deemed stable for discharge. Patient/family member advised to call PCP or come back to ER if needed.   Principal Diagnosis: Sepsis due to pneumonia [facility])  Discharge Diagnoses: Active Hospital Problems   Diagnosis Date Noted   Sepsis due to pneumonia (HCC) 12/25/2023   Multifocal pneumonia 12/26/2023   Generalized weakness 12/26/2023   Essential hypertension 12/26/2023   Bipolar disorder (HCC) 12/26/2023   Acute renal failure superimposed on stage 3b chronic kidney disease (HCC) 01/02/2018    Resolved Hospital Problems  No resolved problems to display.     Discharge Instructions     Diet Carb Modified   Complete by: As directed    Increase activity slowly   Complete by: As directed       Allergies as of 12/28/2023   No Known Allergies      Medication List     STOP taking these medications    traMADol  50 MG tablet Commonly known as: ULTRAM        TAKE these medications    albuterol  (2.5 MG/3ML) 0.083% nebulizer solution Commonly known as: PROVENTIL  Take 2.5 mg by nebulization every  6 (six) hours as needed for shortness of breath or wheezing.   amoxicillin 875 MG tablet Commonly known as: AMOXIL Take 1 tablet (875 mg total) by mouth 2 (two) times daily for 3 days. Start taking on: December 29, 2023   aspirin  EC 81 MG tablet Take 81 mg by mouth at bedtime. Swallow whole.   benazepril  20 MG tablet Commonly known as: LOTENSIN  Take 20 mg by mouth 2 (two) times daily.    benztropine  0.5 MG tablet Commonly known as: COGENTIN  Take 0.5 mg by mouth in the morning.   divalproex 500 MG 24 hr tablet Commonly known as: DEPAKOTE ER Take 500 mg by mouth at bedtime.   Doxepin  HCl 6 MG Tabs Take 6 mg by mouth at bedtime as needed (for insomnia).   doxycycline  100 MG tablet Commonly known as: VIBRA -TABS Take 1 tablet (100 mg total) by mouth 2 (two) times daily for 3 days. Start taking on: December 29, 2023   HumaLOG Mix 75/25 (75-25) 100 UNIT/ML Susp injection Generic drug: insulin  lispro protamine-lispro Inject 15 Units into the skin See admin instructions. Inject 15 units into the skin before breakfast and after supper   Ipratropium-Albuterol  20-100 MCG/ACT Aers respimat Commonly known as: COMBIVENT Inhale 1 puff into the lungs every 6 (six) hours.   LORazepam  0.5 MG tablet Commonly known as: ATIVAN  Take 0.5 mg by mouth daily as needed for sleep or anxiety.   metoprolol  tartrate 50 MG tablet Commonly known as: LOPRESSOR  Take 50 mg by mouth 2 (two) times daily.   mirabegron  ER 50 MG Tb24 tablet Commonly known as: MYRBETRIQ  Take 50 mg by mouth daily.   nitroGLYCERIN  0.4 MG SL tablet Commonly known as: NITROSTAT  Place 1 tablet (0.4 mg total) under the tongue every 5 (five) minutes x 3 doses as needed for chest pain.   Propylene Glycol 0.6 % Soln Place 1 drop into both eyes 2 (two) times daily as needed (for dryness).   risperiDONE  3 MG tablet Commonly known as: RISPERDAL  Take 3 mg by mouth at bedtime.   rosuvastatin  10 MG tablet Commonly known as: CRESTOR  Take 1 tablet (10 mg total) by mouth daily.   torsemide 20 MG tablet Commonly known as: DEMADEX Take 20 mg by mouth every Monday, Wednesday, and Friday.   Trelegy Ellipta 100-62.5-25 MCG/ACT Aepb Generic drug: Fluticasone-Umeclidin-Vilant Inhale 1 puff into the lungs daily as needed (for respiratory flares).   TYLENOL  500 MG tablet Generic drug: acetaminophen  Take 1,000 mg by mouth  every 6 (six) hours as needed for mild pain (pain score 1-3) (or headaches).   Vitamin D (Ergocalciferol) 1.25 MG (50000 UNIT) Caps capsule Commonly known as: DRISDOL Take 50,000 Units by mouth every Monday.   Vitamin Deficiency System-B12 1000 MCG/ML Kit Generic drug: Cyanocobalamin  Inject 1,000 mcg into the skin every 30 (thirty) days. What changed: Another medication with the same name was removed. Continue taking this medication, and follow the directions you see here.        Follow-up Information     Duwaine Burnard Amble, NP Follow up.   Specialty: Nurse Practitioner Why: TIME : 12:20 PM  PLEASE ARRIVE AT 12:00 DATE : NOVEMBER 07 , 2025 FRIDAY\  PLEASE BRING ALL CURRENT MEDICATION, ID and INS CARD Contact information: [address] [place] [address] [phone]         [facility]) Follow up.   Specialty: Home Health Services Why: Physical Therapy-office to call with visit times. Contact information: [address]  [address] [place]  [phone]               No Known Allergies  Consultations:   Procedures:   Discharge Exam: BP (!) 157/69   Pulse 90   Temp 98.3 F (36.8 C) (Oral)   Resp 18   Ht 5' 5 (1.651 m)   Wt 95.5 kg   SpO2 96%   BMI 35.03 kg/m  Physical Exam Constitutional:      Appearance: Normal appearance.  HENT:     Head: Normocephalic and atraumatic.     Mouth/Throat:     Mouth: Mucous membranes are moist.  Eyes:     Extraocular Movements: Extraocular movements intact.  Cardiovascular:     Rate and Rhythm: Normal rate and regular rhythm.  Pulmonary:     Effort: Pulmonary effort is normal. No respiratory distress.     Breath sounds: Normal breath sounds. No wheezing.  Abdominal:     General: Bowel sounds are normal. There is no distension.     Palpations: Abdomen is soft.     Tenderness: There is no abdominal tenderness.  Musculoskeletal:        General:  Normal range of motion.     Cervical back: Normal range of motion and neck supple.  Skin:    General: Skin is warm and dry.  Neurological:     General: No focal deficit present.     Mental Status: She is alert.  Psychiatric:        Mood and Affect: Mood normal.      The results of significant diagnostics from this hospitalization (including imaging, microbiology, ancillary and laboratory) are listed below for reference.   Microbiology: Recent Results (from the past 240 hours)  Blood culture (routine x 2)     Status: None (Preliminary result)   Collection Time: 12/25/23  3:50 PM   Specimen: BLOOD LEFT FOREARM  Result Value Ref Range Status   Specimen Description BLOOD LEFT FOREARM  Final   Special Requests   Final    BOTTLES DRAWN AEROBIC AND ANAEROBIC Blood Culture results may not be optimal due to an inadequate volume of blood received in culture bottles   Culture   Final    NO GROWTH 3 DAYS Performed at [facility], [address] [place], [place] [address]    Report Status PENDING  Incomplete  Blood culture (routine x 2)     Status: None (Preliminary result)   Collection Time: 12/25/23  4:37 PM   Specimen: BLOOD  Result Value Ref Range Status   Specimen Description BLOOD SITE NOT SPECIFIED  Final   Special Requests   Final    BOTTLES DRAWN AEROBIC AND ANAEROBIC Blood Culture results may not be optimal due to an inadequate volume of blood received in culture bottles   Culture   Final    NO GROWTH 3 DAYS Performed at [facility], [address] [place], [place] [address]    Report Status PENDING  Incomplete  Resp panel by RT-PCR (RSV, Flu A&B, Covid) Anterior Nasal Swab     Status: None   Collection Time: 12/25/23  7:24 PM   Specimen: Anterior Nasal Swab  Result Value Ref Range Status   SARS Coronavirus 2 by RT PCR NEGATIVE NEGATIVE Final   Influenza A by PCR NEGATIVE NEGATIVE Final   Influenza B by PCR NEGATIVE NEGATIVE Final    Comment: (NOTE) The  Xpert Xpress SARS-CoV-2/FLU/RSV plus assay is  intended as an aid in the diagnosis of influenza from Nasopharyngeal swab specimens and should not be used as a sole basis for treatment. Nasal washings and aspirates are unacceptable for Xpert Xpress SARS-CoV-2/FLU/RSV testing.  Fact Sheet for Patients: BloggerCourse.com  Fact Sheet for Healthcare Providers: SeriousBroker.it  This test is not yet approved or cleared by the [place]  FDA and has been authorized for detection and/or diagnosis of SARS-CoV-2 by FDA under an Emergency Use Authorization (EUA). This EUA will remain in effect (meaning this test can be used) for the duration of the COVID-19 declaration under Section 564(b)(1) of the Act, 21 U.S.C. section 360bbb-3(b)(1), unless the authorization is terminated or revoked.     Resp Syncytial Virus by PCR NEGATIVE NEGATIVE Final    Comment: (NOTE) Fact Sheet for Patients: BloggerCourse.com  Fact Sheet for Healthcare Providers: SeriousBroker.it  This test is not yet approved or cleared by the [place]  FDA and has been authorized for detection and/or diagnosis of SARS-CoV-2 by FDA under an Emergency Use Authorization (EUA). This EUA will remain in effect (meaning this test can be used) for the duration of the COVID-19 declaration under Section 564(b)(1) of the Act, 21 U.S.C. section 360bbb-3(b)(1), unless the authorization is terminated or revoked.  Performed at [facility] Lab, [address] [place], [place] [address]   Respiratory (~20 pathogens) panel by PCR     Status: None   Collection Time: 12/25/23  7:24 PM   Specimen: Nasopharyngeal Swab; Respiratory  Result Value Ref Range Status   Adenovirus NOT DETECTED NOT DETECTED Final   Coronavirus 229E NOT DETECTED NOT DETECTED Final    Comment: (NOTE) The Coronavirus on the Respiratory Panel, DOES NOT test for  the novel  Coronavirus (2019 nCoV)    Coronavirus HKU1 NOT DETECTED NOT DETECTED Final   Coronavirus NL63 NOT DETECTED NOT DETECTED Final   Coronavirus OC43 NOT DETECTED NOT DETECTED Final   Metapneumovirus NOT DETECTED NOT DETECTED Final   Rhinovirus / Enterovirus NOT DETECTED NOT DETECTED Final   Influenza A NOT DETECTED NOT DETECTED Final   Influenza B NOT DETECTED NOT DETECTED Final   Parainfluenza Virus 1 NOT DETECTED NOT DETECTED Final   Parainfluenza Virus 2 NOT DETECTED NOT DETECTED Final   Parainfluenza Virus 3 NOT DETECTED NOT DETECTED Final   Parainfluenza Virus 4 NOT DETECTED NOT DETECTED Final   Respiratory Syncytial Virus NOT DETECTED NOT DETECTED Final   Bordetella pertussis NOT DETECTED NOT DETECTED Final   Bordetella Parapertussis NOT DETECTED NOT DETECTED Final   Chlamydophila pneumoniae NOT DETECTED NOT DETECTED Final   Mycoplasma pneumoniae NOT DETECTED NOT DETECTED Final    Comment: Performed at [facility], [address] [place], [place] [address]  Expectorated Sputum Assessment w Gram Stain, Rflx to Resp Cult     Status: None   Collection Time: 12/25/23  8:12 PM   Specimen: Expectorated Sputum  Result Value Ref Range Status   Specimen Description EXPECTORATED SPUTUM  Final   Special Requests NONE  Final   Sputum evaluation   Final    THIS SPECIMEN IS ACCEPTABLE FOR SPUTUM CULTURE Performed at [facility], [address] [place], [place] [address]    Report Status 12/27/2023 FINAL  Final  Culture, Respiratory w Gram Stain     Status: None (Preliminary result)   Collection Time: 12/25/23  8:12 PM  Result Value Ref Range Status   Specimen Description EXPECTORATED SPUTUM  Final   Special Requests NONE Reflexed from  [id number]  Final   Gram Stain   Final    FEW WBC PRESENT,BOTH PMN AND MONONUCLEAR FEW GRAM POSITIVE COCCI FEW GRAM NEGATIVE RODS    Culture   Final    CULTURE REINCUBATED FOR BETTER GROWTH Performed at [facility],  [address], [place], [place] [address]    Report Status PENDING  Incomplete  MRSA Next Gen by PCR, Nasal     Status: None   Collection Time: 12/25/23  8:13 PM   Specimen: Nasal Mucosa; Nasal Swab  Result Value Ref Range Status   MRSA by PCR Next Gen NOT DETECTED NOT DETECTED Final    Comment: (NOTE) The GeneXpert MRSA Assay (FDA approved for NASAL specimens only), is one component of a comprehensive MRSA colonization surveillance program. It is not intended to diagnose MRSA infection nor to guide or monitor treatment for MRSA infections. Test performance is not FDA approved in patients less than 53 years old. Performed at [facility], [address] [place], [place] [address]      Labs: BNP (last 3 results) Recent Labs    12/25/23 1548  BNP 192.3*   Basic Metabolic Panel: Recent Labs  Lab 12/25/23 1148 12/26/23 0438 12/27/23 1630  NA 136 136 139  K 3.6 3.8 4.0  CL 100 102 104  CO2 21* 21* 23  GLUCOSE 252* 189* 180*  BUN 28* 23 26*  CREATININE 1.83* 1.24* 1.17*  CALCIUM  8.6* 8.5* 9.0  MG  --   --  2.0  PHOS  --   --  3.0   Liver Function Tests: Recent Labs  Lab 12/25/23 1153 12/27/23 1630  AST 15  --   ALT 10  --   ALKPHOS 66  --   BILITOT 1.4*  --   PROT 6.1*  --   ALBUMIN 3.0* 2.9*   No results for input(s): LIPASE, AMYLASE in the last 168 hours. No results for input(s): AMMONIA in the last 168 hours. CBC: Recent Labs  Lab 12/25/23 1148 12/26/23 0438 12/27/23 1630  WBC 17.9* 15.8* 13.7*  NEUTROABS  --   --  12.3*  HGB 10.4* 10.1* 10.2*  HCT 31.1* 29.8* 31.2*  MCV 108.0* 107.6* 107.6*  PLT 261 229 322   Cardiac Enzymes: No results for input(s): CKTOTAL, CKMB, CKMBINDEX, TROPONINI in the last 168 hours. BNP: Invalid input(s): POCBNP CBG: Recent Labs  Lab 12/27/23 1121 12/27/23 1554 12/27/23 2120 12/28/23 0751 12/28/23 1132  GLUCAP 136* 165* 154* 113* 104*   D-Dimer No results for input(s): DDIMER in the last  72 hours. Hgb A1c Recent Labs    12/26/23 0438  HGBA1C 5.0   Lipid Profile No results for input(s): CHOL, HDL, LDLCALC, TRIG, CHOLHDL, LDLDIRECT in the last 72 hours. Thyroid  function studies No results for input(s): TSH, T4TOTAL, T3FREE, THYROIDAB in the last 72 hours.  Invalid input(s): FREET3 Anemia work up No results for input(s): VITAMINB12, FOLATE, FERRITIN, TIBC, IRON, RETICCTPCT in the last 72 hours. Urinalysis    Component Value Date/Time   COLORURINE YELLOW 12/25/2023 1516   APPEARANCEUR HAZY (A) 12/25/2023 1516   LABSPEC 1.022 12/25/2023 1516   PHURINE 5.0 12/25/2023 1516   GLUCOSEU >=500 (A) 12/25/2023 1516   HGBUR NEGATIVE 12/25/2023 1516   BILIRUBINUR NEGATIVE 12/25/2023 1516   KETONESUR NEGATIVE 12/25/2023 1516   PROTEINUR NEGATIVE 12/25/2023 1516   UROBILINOGEN 0.2 07/20/2014 0732   NITRITE NEGATIVE 12/25/2023 1516   LEUKOCYTESUR NEGATIVE 12/25/2023 1516   Sepsis Labs Recent Labs  Lab 12/25/23 1148 12/26/23 0438 12/27/23 1630  WBC 17.9* 15.8* 13.7*   Microbiology Recent Results (from the past 240 hours)  Blood culture (routine x 2)     Status: None (Preliminary result)   Collection Time: 12/25/23  3:50 PM   Specimen: BLOOD LEFT FOREARM  Result Value Ref Range Status   Specimen Description BLOOD LEFT FOREARM  Final   Special Requests   Final    BOTTLES DRAWN AEROBIC AND ANAEROBIC Blood Culture results may not be optimal due to an inadequate volume of blood received in culture bottles   Culture   Final    NO GROWTH 3 DAYS Performed at [facility], [address], [place], [place] [address]    Report Status PENDING  Incomplete  Blood culture (routine x 2)     Status: None (Preliminary result)   Collection Time: 12/25/23  4:37 PM   Specimen: BLOOD  Result Value Ref Range Status   Specimen Description BLOOD SITE NOT SPECIFIED  Final   Special Requests   Final    BOTTLES DRAWN AEROBIC AND ANAEROBIC Blood  Culture results may not be optimal due to an inadequate volume of blood received in culture bottles   Culture   Final    NO GROWTH 3 DAYS Performed at [facility], [address], [place], [place] [address]    Report Status PENDING  Incomplete  Resp panel by RT-PCR (RSV, Flu A&B, Covid) Anterior Nasal Swab     Status: None   Collection Time: 12/25/23  7:24 PM   Specimen: Anterior Nasal Swab  Result Value Ref Range Status   SARS Coronavirus 2 by RT PCR NEGATIVE NEGATIVE Final   Influenza A by PCR NEGATIVE NEGATIVE Final   Influenza B by PCR NEGATIVE NEGATIVE Final    Comment: (NOTE) The Xpert Xpress SARS-CoV-2/FLU/RSV plus assay is intended as an aid in the diagnosis of influenza from Nasopharyngeal swab specimens and should not be used as a sole basis for treatment. Nasal washings and aspirates are unacceptable for Xpert Xpress SARS-CoV-2/FLU/RSV testing.  Fact Sheet for Patients: BloggerCourse.com  Fact Sheet for Healthcare Providers: SeriousBroker.it  This test is not yet approved or cleared by the [place]  FDA and has been authorized for detection and/or diagnosis of SARS-CoV-2 by FDA under an Emergency Use Authorization (EUA). This EUA will remain in effect (meaning this test can be used) for the duration of the COVID-19 declaration under Section 564(b)(1) of the Act, 21 U.S.C. section 360bbb-3(b)(1), unless the authorization is terminated or revoked.     Resp Syncytial Virus by PCR NEGATIVE NEGATIVE Final    Comment: (NOTE) Fact Sheet for Patients: BloggerCourse.com  Fact Sheet for Healthcare Providers: SeriousBroker.it  This test is not yet approved or cleared by the [place]  FDA and has been authorized for detection and/or diagnosis of SARS-CoV-2 by FDA under an Emergency Use Authorization (EUA). This EUA will remain in effect (meaning this test can  be used) for the duration of the COVID-19 declaration under Section 564(b)(1) of the Act, 21 U.S.C. section 360bbb-3(b)(1), unless the authorization is terminated or revoked.  Performed at [facility] Lab, [address], [place], [place] [address]   Respiratory (~20 pathogens) panel by PCR     Status: None   Collection Time: 12/25/23  7:24 PM   Specimen: Nasopharyngeal Swab; Respiratory  Result Value Ref Range Status   Adenovirus NOT DETECTED NOT DETECTED Final   Coronavirus 229E NOT DETECTED NOT DETECTED Final  Comment: (NOTE) The Coronavirus on the Respiratory Panel, DOES NOT test for the novel  Coronavirus (2019 nCoV)    Coronavirus HKU1 NOT DETECTED NOT DETECTED Final   Coronavirus NL63 NOT DETECTED NOT DETECTED Final   Coronavirus OC43 NOT DETECTED NOT DETECTED Final   Metapneumovirus NOT DETECTED NOT DETECTED Final   Rhinovirus / Enterovirus NOT DETECTED NOT DETECTED Final   Influenza A NOT DETECTED NOT DETECTED Final   Influenza B NOT DETECTED NOT DETECTED Final   Parainfluenza Virus 1 NOT DETECTED NOT DETECTED Final   Parainfluenza Virus 2 NOT DETECTED NOT DETECTED Final   Parainfluenza Virus 3 NOT DETECTED NOT DETECTED Final   Parainfluenza Virus 4 NOT DETECTED NOT DETECTED Final   Respiratory Syncytial Virus NOT DETECTED NOT DETECTED Final   Bordetella pertussis NOT DETECTED NOT DETECTED Final   Bordetella Parapertussis NOT DETECTED NOT DETECTED Final   Chlamydophila pneumoniae NOT DETECTED NOT DETECTED Final   Mycoplasma pneumoniae NOT DETECTED NOT DETECTED Final    Comment: Performed at [facility] Lab, [address], [place], [place] [address]  Expectorated Sputum Assessment w Gram Stain, Rflx to Resp Cult     Status: None   Collection Time: 12/25/23  8:12 PM   Specimen: Expectorated Sputum  Result Value Ref Range Status   Specimen Description EXPECTORATED SPUTUM  Final   Special Requests NONE  Final   Sputum evaluation   Final    THIS SPECIMEN IS  ACCEPTABLE FOR SPUTUM CULTURE Performed at [facility], [address] [place], [place] [address]    Report Status 12/27/2023 FINAL  Final  Culture, Respiratory w Gram Stain     Status: None (Preliminary result)   Collection Time: 12/25/23  8:12 PM  Result Value Ref Range Status   Specimen Description EXPECTORATED SPUTUM  Final   Special Requests NONE Reflexed from [id number]  Final   Gram Stain   Final    FEW WBC PRESENT,BOTH PMN AND MONONUCLEAR FEW GRAM POSITIVE COCCI FEW GRAM NEGATIVE RODS    Culture   Final    CULTURE REINCUBATED FOR BETTER GROWTH Performed at [facility] Lab, [address], [place], [place] [address]    Report Status PENDING  Incomplete  MRSA Next Gen by PCR, Nasal     Status: None   Collection Time: 12/25/23  8:13 PM   Specimen: Nasal Mucosa; Nasal Swab  Result Value Ref Range Status   MRSA by PCR Next Gen NOT DETECTED NOT DETECTED Final    Comment: (NOTE) The GeneXpert MRSA Assay (FDA approved for NASAL specimens only), is one component of a comprehensive MRSA colonization surveillance program. It is not intended to diagnose MRSA infection nor to guide or monitor treatment for MRSA infections. Test performance is not FDA approved in patients less than 24 years old. Performed at [facility] Lab, [address], [place], [place] [address]     Procedures/Studies: VAS US  LOWER EXTREMITY VENOUS (DVT) (ONLY [facility] & [facility]) Result Date: 12/26/2023  Lower Venous DVT Study Patient Name:  Marilyn Sexton  Date of Exam:   12/25/2023 Medical Rec #: [id number]           Accession #:    [id number] Date of Birth: 1945-12-02           Patient Gender: F Patient Age:   35 years Exam Location:  [facility] Procedure:      VAS US  LOWER EXTREMITY VENOUS (DVT) Referring Phys: BRITNI HENDERLY --------------------------------------------------------------------------------  Indications:  Pain, and SOB. Left sided abdominal pain X 1 month, worsening overnight.   Limitations: Body habitus and poor ultrasound/tissue interface. Comparison Study: Prior negative left LEV done 10/10/20. Prior negative right LEV                   done 02/20/17 Performing Technologist: Alberta Lis RVS  Examination Guidelines: A complete evaluation includes B-mode imaging, spectral Doppler, color Doppler, and power Doppler as needed of all accessible portions of each vessel. Bilateral testing is considered an integral part of a complete examination. Limited examinations for reoccurring indications may be performed as noted. The reflux portion of the exam is performed with the patient in reverse Trendelenburg.  +---------+---------------+---------+-----------+----------+--------------+ RIGHT    CompressibilityPhasicitySpontaneityPropertiesThrombus Aging +---------+---------------+---------+-----------+----------+--------------+ CFV      Full           Yes      Yes                                 +---------+---------------+---------+-----------+----------+--------------+ SFJ      Full                                                        +---------+---------------+---------+-----------+----------+--------------+ FV Prox  Full           Yes      Yes                                 +---------+---------------+---------+-----------+----------+--------------+ FV Mid   Full           Yes      Yes                                 +---------+---------------+---------+-----------+----------+--------------+ FV DistalFull           Yes      Yes                                 +---------+---------------+---------+-----------+----------+--------------+ PFV      Full           Yes      Yes                                 +---------+---------------+---------+-----------+----------+--------------+ POP      Full           Yes      Yes                                 +---------+---------------+---------+-----------+----------+--------------+ PTV      Full                                                         +---------+---------------+---------+-----------+----------+--------------+ PERO     Full                                                        +---------+---------------+---------+-----------+----------+--------------+   +---------+---------------+---------+-----------+----------+-------------------+  LEFT     CompressibilityPhasicitySpontaneityPropertiesThrombus Aging      +---------+---------------+---------+-----------+----------+-------------------+ CFV      Full           Yes      Yes                                      +---------+---------------+---------+-----------+----------+-------------------+ SFJ      Full                                                             +---------+---------------+---------+-----------+----------+-------------------+ FV Prox  Full                                                             +---------+---------------+---------+-----------+----------+-------------------+ FV Mid   Full                                                             +---------+---------------+---------+-----------+----------+-------------------+ FV DistalFull           Yes      Yes                                      +---------+---------------+---------+-----------+----------+-------------------+ PFV      Full                                                             +---------+---------------+---------+-----------+----------+-------------------+ POP      Full           Yes      Yes                                      +---------+---------------+---------+-----------+----------+-------------------+ PTV      Full                                                             +---------+---------------+---------+-----------+----------+-------------------+ PERO                                                  Not well visualized  +---------+---------------+---------+-----------+----------+-------------------+     Summary: BILATERAL: - No evidence of deep vein thrombosis seen in the  lower extremities, bilaterally. -No evidence of popliteal cyst, bilaterally.   *See table(s) above for measurements and observations. Electronically signed by Lonni Gaskins MD on 12/26/2023 at 10:33:24 AM.    Final    CT CHEST ABDOMEN PELVIS WO CONTRAST Result Date: 12/25/2023 CLINICAL DATA:  Flank pain, chest pain, left basilar pneumonia on x-ray EXAM: CT CHEST, ABDOMEN AND PELVIS WITHOUT CONTRAST TECHNIQUE: Multidetector CT imaging of the chest, abdomen and pelvis was performed following the standard protocol without IV contrast. The examination was performed without intravenous contrast due to history of renal insufficiency. Assessment of the soft tissues, solid viscera, and vascular structures are severely limited. RADIATION DOSE REDUCTION: This exam was performed according to the departmental dose-optimization program which includes automated exposure control, adjustment of the mA and/or kV according to patient size and/or use of iterative reconstruction technique. COMPARISON:  12/25/2023, 12/03/2020 FINDINGS: CT CHEST FINDINGS Cardiovascular: Unenhanced imaging of the heart is unremarkable without pericardial effusion. Normal caliber of the thoracic aorta. Extensive atherosclerosis of the aorta and coronary vasculature. Assessment of the vascular lumen cannot be performed without intravenous contrast. Mediastinum/Nodes: Mildly enlarged mediastinal lymph nodes measuring up to 13 mm in the subcarinal region, likely reactive. Thyroid , trachea, and esophagus are unremarkable. Lungs/Pleura: Dense left lower lobe airspace disease consistent with pneumonia, unchanged since preceding x-ray. Less pronounced areas of patchy consolidation are seen within the right upper lobe and lingula, which could reflect additional areas of pneumonia. No effusion or  pneumothorax. Central airways are patent. Musculoskeletal: No acute or destructive bony abnormalities. Reconstructed images demonstrate no additional findings. CT ABDOMEN PELVIS FINDINGS Hepatobiliary: Unremarkable unenhanced appearance of the liver. Prior cholecystectomy. No biliary duct dilation. Pancreas: Unremarkable unenhanced appearance. Spleen: Unremarkable unenhanced appearance. Adrenals/Urinary Tract: Mild bilateral renal cortical atrophy consistent with known history of chronic renal insufficiency. Extensive calcifications at the renal hila are felt to be vascular. No obstructive uropathy within either kidney. The adrenals and bladder are unremarkable. Stomach/Bowel: No bowel obstruction or ileus. Normal appendix right lower quadrant. Scattered sigmoid diverticulosis without diverticulitis. No bowel wall thickening or inflammatory change. Vascular/Lymphatic: Normal caliber of the abdominal aorta. Extensive atherosclerosis. Assessment of the vascular lumen cannot be performed without intravenous contrast. No pathologic adenopathy. Reproductive: Uterus and bilateral adnexa are unremarkable. Other: No free fluid or free intraperitoneal gas. No abdominal wall hernia. Musculoskeletal: No acute or destructive bony abnormalities. Reconstructed images demonstrate no additional findings. IMPRESSION: 1. Dense left lower lobe pneumonia. Patchy airspace disease within the right upper lobe and lingula likely reflect additional foci of infection. 2. Reactive mediastinal lymphadenopathy. 3. No evidence of thoracoabdominal aortic aneurysm. Assessment of the vascular lumen cannot be performed without IV contrast. 4. Scattered sigmoid diverticulosis without diverticulitis. 5. Aortic Atherosclerosis (ICD10-I70.0). Coronary artery atherosclerosis. Electronically Signed   By: Ozell Daring M.D.   On: 12/25/2023 17:32   DG Chest 2 View Result Date: 12/25/2023 CLINICAL DATA:  Chest pain EXAM: CHEST - 2 VIEW COMPARISON:   03/24/2023 FINDINGS: Normal-sized heart. Interval patchy density at the left lung base. The remainder of the lungs are clear with normal vascularity. Tortuous and partially calcified thoracic aorta. Thoracic spine degenerative changes. Mild-to-moderate right glenohumeral degenerative changes. IMPRESSION: Left basilar pneumonia. Electronically Signed   By: Elspeth Bathe M.D.   On: 12/25/2023 12:29   US  RENAL Result Date: 12/23/2023 CLINICAL DATA:  Chronic kidney disease EXAM: RENAL / URINARY TRACT ULTRASOUND COMPLETE COMPARISON:  CT abdomen pelvis December 03, 2020 FINDINGS: Right Kidney: Renal measurements: 9.7 x 5.1 x 5.9 cm = volume:  152 mL. Increased echogenicity. Cortical thinning. No mass or hydronephrosis visualized. Left Kidney: Renal measurements: 11.4 x 5.2 x 6 cm = volume: 188 mL. Increased echogenicity. Cortical thinning. No mass or hydronephrosis visualized. Bladder: Appears normal for degree of bladder distention. Other: None. IMPRESSION: Cortical atrophic changes and increased echogenicity consistent with known chronic kidney disease. No hydronephrosis. Electronically Signed   By: Megan  Zare M.D.   On: 12/23/2023 16:36     Time coordinating discharge: Over 30 minutes    Alm Apo, MD  [facility] 12/28/2023, 5:00 PM

## 2023-12-28 NOTE — Progress Notes (Signed)
 Physical Therapy Treatment Patient Details Name: Marilyn Sexton MRN: [id number] DOB: 12/20/1945 Today's Date: 12/28/2023   History of Present Illness 78 y.o. female presents to [facility] 12/25/23 with cough and L sided abdominal pain. Admitted with sepsis 2/2 PNA and COPD exacerbation. PMHx: T2DM, HTN, MI/CAD, CKD 3B, bladder spasms, bipolar disorder, insomnia, COPD and arthritis    PT Comments  The pt was agreeable to session with focus on mobility progression and further assessment of respiratory status with mobility. Pt was able to maintain SpO2 >90% on RA with further ambulation this session, and demos improved gait speed that pt reports to be her baseline. The pt did demo increased difficulty with sit-stand transfers and therefore was educated on use of DME and other adjustments to chairs at home to facilitate sit-stand and updated recommendation to HHPT to progress functional strength and power in LE.    HR: 92bpm SpO2: 92-96% on RA   If plan is discharge home, recommend the following: Assist for transportation;Help with stairs or ramp for entrance;A little help with walking and/or transfers   Can travel by private vehicle        Equipment Recommendations  None recommended by PT    Recommendations for Other Services       Precautions / Restrictions Precautions Precautions: Fall Recall of Precautions/Restrictions: Intact Precaution/Restrictions Comments: watch SpO2 Restrictions Weight Bearing Restrictions Per Provider Order: No     Mobility  Bed Mobility Overal bed mobility: Needs Assistance Bed Mobility: Supine to Sit, Sit to Supine     Supine to sit: Min assist, HOB elevated Sit to supine: Min assist, HOB elevated   General bed mobility comments: minA to complete trunk elevation, no assist to LE. increased time to complete    Transfers Overall transfer level: Needs assistance Equipment used: Rolling walker (2 wheels) Transfers: Sit to/from Stand Sit to Stand:  Mod assist           General transfer comment: increased use of momentum, min-modA to rise. poor power in LE to generate hip lift    Ambulation/Gait Ambulation/Gait assistance: Supervision Gait Distance (Feet): 250 Feet Assistive device: Rolling walker (2 wheels) Gait Pattern/deviations: Step-through pattern, Decreased stance time - left Gait velocity: 0.5 m/s Gait velocity interpretation: <1.8 ft/sec, indicate of risk for recurrent falls   General Gait Details: slow and steady gait, cues for RW proximity    Balance Overall balance assessment: Needs assistance Sitting-balance support: No upper extremity supported, Feet supported Sitting balance-Leahy Scale: Good     Standing balance support: Bilateral upper extremity supported, During functional activity, Reliant on assistive device for balance Standing balance-Leahy Scale: Poor Standing balance comment: reliant on UE support                            Communication Communication Communication: No apparent difficulties  Cognition Arousal: Alert Behavior During Therapy: Flat affect   PT - Cognitive impairments: No apparent impairments                       PT - Cognition Comments: flat affect but functional with questions in session Following commands: Intact      Cueing Cueing Techniques: Verbal cues  Exercises      General Comments General comments (skin integrity, edema, etc.): SpO2 92-96% on RA with gait      Pertinent Vitals/Pain Pain Assessment Pain Assessment: No/denies pain     PT Goals (current goals can now be  found in the care plan section) Acute Rehab PT Goals Patient Stated Goal: to feel better PT Goal Formulation: With patient/family Time For Goal Achievement: 01/09/24 Potential to Achieve Goals: Good Progress towards PT goals: Progressing toward goals    Frequency    Min 1X/week       AM-PAC PT 6 Clicks Mobility   Outcome Measure  Help needed turning from  your back to your side while in a flat bed without using bedrails?: A Lot Help needed moving from lying on your back to sitting on the side of a flat bed without using bedrails?: A Lot Help needed moving to and from a bed to a chair (including a wheelchair)?: A Little Help needed standing up from a chair using your arms (e.g., wheelchair or bedside chair)?: A Lot Help needed to walk in hospital room?: A Little Help needed climbing 3-5 steps with a railing? : A Little 6 Click Score: 15    End of Session Equipment Utilized During Treatment: Gait belt Activity Tolerance: Patient tolerated treatment well Patient left: in bed;with call bell/phone within reach;with family/visitor present Nurse Communication: Mobility status PT Visit Diagnosis: Other abnormalities of gait and mobility (R26.89)     Time: 8861-8840 PT Time Calculation (min) (ACUTE ONLY): 21 min  Charges:    $Gait Training: 8-22 mins $Therapeutic Exercise: 8-22 mins PT General Charges $$ ACUTE PT VISIT: 1 Visit                     Izetta Call, PT, DPT   Acute Rehabilitation Department Office [phone] Secure Chat Communication Preferred    Izetta JULIANNA Call 12/28/2023, 12:41 PM

## 2023-12-28 NOTE — Progress Notes (Signed)
 Discharge Nurse Summary: DC order noted per MD. DC RN at bedside with patient. Patient agreeable with discharge plan, husband at the bedside. Noted patient did not have a BM since Friday 10/17, MD notified, patient cleared for dc. AVS printed/reviewed. PIV removed, skin intact. No DME needs. No home meds. TOC meds pending pickup. CP/Edu resolved. Telemonitor returned to charging station. All belongings accounted for. Patient wheeled downstairs by NT for discharge by private auto. TOC meds picked up on the way out.  Rosario EMERSON Lund, RN

## 2023-12-28 NOTE — Progress Notes (Signed)
   12/28/23 0014  Provider Notification  Provider Name/Title Dr Shona  Date Provider Notified 12/28/23  Time Provider Notified 0022  Method of Notification Page (secured chat)  Notification Reason Other (Comment) (BP elevated. Pt denies any symptoms)  Provider response See new orders  Date of Provider Response 12/28/23  Time of Provider Response [phone]

## 2023-12-28 NOTE — Hospital Course (Signed)
 Patient is a 78 year old female with past medical history significant for type 2 diabetes mellitus, hypertension, MI/CAD, CKD 3B, bladder spasms, bipolar disorder, insomnia, COPD and arthritis.  Patient presented with cough and left-sided upper abdominal/lower chest pain.  CT scan of the chest revealed multifocal pneumonia, mainly at the left lung base.       Assessment & Plan:   Sepsis: Community-acquired pneumonia: - Pt presented with worsening cough and mild shortness of breath - Chest x-ray revealed left basilar pneumonia.   -CT chest revealed multifocal pneumonia. - Met sepsis criteria with tachycardia, tachypnea, leukocytosis, lactic acidosis and evidence of respiratory infection - s/p Rocephin  and azithromycin; transitioned to amoxicillin and doxycycline  to complete course at discharge   COPD exacerbation -resolved - Presented with worsening cough, wheezing and shortness of breath -Treated with prednisone , nebulizers, and antibiotics   Type 2 diabetes with hyperglycemia - Resume home regimen   AKI on CKD 3B - Creatinine was elevated on presentation (1.83). - Baseline serum creatinine of 1.2-1.5 - Likely secondary to dehydration and acute infection - Serum creatinine is improving.    Hypertension: -Continue home regimen    Bipolar disorder - Continue risperidone    Hyperlipidemia:  - Continue rosuvastatin    Bladder spasms - Continue Myrbetriq    Generalized weakness - In the setting of acute illness - PT/OT eval and treat -Home health PT at discharge

## 2023-12-28 NOTE — TOC Initial Note (Addendum)
 Transition of Care [facility]) - Initial/Assessment Note    Patient Details  Name: Marilyn Sexton MRN: [id number] Date of Birth: 02-Mar-1946  [facility] of [facility]) CM/SW Contact:    Sudie Erminio Deems, RN Phone Number: 12/28/2023, 12:15 PM  Clinical Narrative:  Patient presented for cough and left sided pain. PTA patient was from home with spouse. Patient has DME wheelchair rolling walker, and nebulizer. Patient has PCP and spouse takes her to appointments. Inpatient Case Manager following for additional PT recommendations.                 1252 12-28-23 updated information: Patient will need HH PT. Family did not have an agency preference-referral submitted to [facility]. Office to call with visit times. No further needs identified.  Expected Discharge Plan: Home w Home Health Services Barriers to Discharge: No Barriers Identified   Patient Goals and CMS Choice Patient states their goals for this hospitalization and ongoing recovery are:: Patient plans to return home          Expected Discharge Plan and Services In-house Referral: NA Discharge Planning Services: CM Consult Post Acute Care Choice: Home Health Living arrangements for the past 2 months: Single Family Home Expected Discharge Date: 12/28/23                 DME Agency: NA       HH Arranged: NA          Prior Living Arrangements/Services Living arrangements for the past 2 months: Single Family Home Lives with:: Spouse Patient language and need for interpreter reviewed:: Yes Do you feel safe going back to the place where you live?: Yes      Need for Family Participation in Patient Care: Yes (Comment) Care giver support system in place?: Yes (comment) Current home services: DME (rolling walker, wheelchair, nebulizer machine.) Criminal Activity/Legal Involvement Pertinent to Current Situation/Hospitalization: No - Comment as needed  Activities of Daily Living   ADL Screening (condition  at time of admission) Independently performs ADLs?: Yes (appropriate for developmental age) Is the patient deaf or have difficulty hearing?: No Does the patient have difficulty seeing, even when wearing glasses/contacts?: No Does the patient have difficulty concentrating, remembering, or making decisions?: No  Permission Sought/Granted Permission sought to share information with : Case Manager, Family Supports                Emotional Assessment Appearance:: Appears stated age Attitude/Demeanor/Rapport: Engaged Affect (typically observed): Appropriate Orientation: : Oriented to Self, Oriented to Place, Oriented to  Time Alcohol / Substance Use: Not Applicable Psych Involvement: No (comment)  Admission diagnosis:  Weakness [R53.1] COPD exacerbation (HCC) [J44.1] Elevated d-dimer [R79.89] Sepsis due to pneumonia (HCC) [J18.9, A41.9] Multifocal pneumonia [J18.8] Sepsis without acute organ dysfunction, due to unspecified organism [facility]) [A41.9] Patient Active Problem List   Diagnosis Date Noted   Multifocal pneumonia 12/26/2023   Generalized weakness 12/26/2023   Essential hypertension 12/26/2023   Bipolar disorder (HCC) 12/26/2023   Sepsis due to pneumonia (HCC) 12/25/2023   PAD (peripheral artery disease) 12/10/2020   Leg edema, left 11/13/2020   Anemia 11/01/2020   Cellulitis of left leg 10/31/2020   Hyponatremia 01/02/2018   Acute renal failure superimposed on stage 3b chronic kidney disease (HCC) 01/02/2018   Type 2 diabetes mellitus without complication 01/02/2018   COPD with acute exacerbation (HCC) 01/02/2018   Primary osteoarthritis of right knee 12/31/2017   Acute coronary syndrome (HCC) 08/15/2017   Old complex tear of lateral  meniscus of right knee    Loose body in knee, right knee    Bipolar affective disorder, manic, moderate (HCC) 07/23/2014   History of medication noncompliance    Manic behavior (HCC)    PCP:  Duwaine Burnard Amble, NP Pharmacy:   [facility] - [place], [place] - [address] [place] [address] Phone: [phone] Fax: [phone]  [facility] [address] [place] [address] Phone: [phone] Fax: [phone]     Social Drivers of Health (SDOH) Social History: SDOH Screenings   Food Insecurity: No Food Insecurity (12/26/2023)  Housing: Low Risk  (12/26/2023)  Transportation Needs: No Transportation Needs (12/26/2023)  Utilities: Not At Risk (12/26/2023)  Depression (PHQ2-9): Low Risk  (11/13/2020)  Social Connections: Socially Integrated (12/26/2023)  Tobacco Use: Medium Risk (12/25/2023)   SDOH Interventions:     Readmission Risk Interventions    12/28/2023   11:01 AM  Readmission Risk Prevention Plan  Transportation Screening Complete  PCP or Specialist Appt within 3-5 Days Complete  HRI or Home Care Consult Complete  Social Work Consult for Recovery Care Planning/Counseling Complete  Palliative Care Screening Not Applicable  Medication Review Oceanographer) Referral to Pharmacy

## 2023-12-28 NOTE — Plan of Care (Signed)
  Problem: Clinical Measurements: Goal: Respiratory complications will improve Outcome: Progressing Goal: Cardiovascular complication will be avoided Outcome: Progressing   Problem: Activity: Goal: Risk for activity intolerance will decrease Outcome: Progressing   Problem: Safety: Goal: Ability to remain free from injury will improve Outcome: Progressing   Problem: Activity: Goal: Ability to tolerate increased activity will improve Outcome: Progressing   Problem: Respiratory: Goal: Ability to maintain adequate ventilation will improve Outcome: Progressing Goal: Ability to maintain a clear airway will improve Outcome: Progressing

## 2023-12-29 LAB — LEGIONELLA PNEUMOPHILA SEROGP 1 UR AG: L. pneumophila Serogp 1 Ur Ag: NEGATIVE

## 2023-12-30 LAB — CULTURE, RESPIRATORY W GRAM STAIN: Culture: NORMAL

## 2023-12-30 LAB — CULTURE, BLOOD (ROUTINE X 2)
Culture: NO GROWTH
Culture: NO GROWTH

## 2024-01-10 ENCOUNTER — Encounter: Payer: Self-pay | Admitting: Cardiology

## 2024-01-10 ENCOUNTER — Ambulatory Visit: Attending: Cardiology | Admitting: Cardiology

## 2024-01-10 VITALS — BP 130/58 | HR 73 | Ht 65.0 in | Wt 203.2 lb

## 2024-01-10 DIAGNOSIS — I10 Essential (primary) hypertension: Secondary | ICD-10-CM

## 2024-01-10 DIAGNOSIS — I25118 Atherosclerotic heart disease of native coronary artery with other forms of angina pectoris: Secondary | ICD-10-CM

## 2024-01-10 DIAGNOSIS — Z794 Long term (current) use of insulin: Secondary | ICD-10-CM | POA: Diagnosis not present

## 2024-01-10 DIAGNOSIS — E119 Type 2 diabetes mellitus without complications: Secondary | ICD-10-CM

## 2024-01-10 NOTE — Patient Instructions (Signed)
 Medication Instructions:  Continue same medications *If you need a refill on your cardiac medications before your next appointment, please call your pharmacy*  Lab Work: None ordered  Testing/Procedures: None ordered  Follow-Up: At [facility], you and your health needs are our priority.  As part of our continuing mission to provide you with exceptional heart care, our providers are all part of one team.  This team includes your primary Cardiologist (physician) and Advanced Practice Providers or APPs (Physician Assistants and Nurse Practitioners) who all work together to provide you with the care you need, when you need it.  Your next appointment:  1 year   Call in July to schedule Nov appointment     Provider:  Dr.Jordan   We recommend signing up for the patient portal called MyChart.  Sign up information is provided on this After Visit Summary.  MyChart is used to connect with patients for Virtual Visits (Telemedicine).  Patients are able to view lab/test results, encounter notes, upcoming appointments, etc.  Non-urgent messages can be sent to your provider as well.   To learn more about what you can do with MyChart, go to [email or web address].
